# Patient Record
Sex: Male | Born: 1950 | Race: White | Hispanic: No | Smoking: Never smoker
Health system: Southern US, Community
[De-identification: ages and names within clinical notes are randomized; demographics above are authoritative.]

## PROBLEM LIST (undated history)

## (undated) DIAGNOSIS — E785 Hyperlipidemia, unspecified: Secondary | ICD-10-CM

## (undated) DIAGNOSIS — N401 Enlarged prostate with lower urinary tract symptoms: Secondary | ICD-10-CM

## (undated) DIAGNOSIS — G5601 Carpal tunnel syndrome, right upper limb: Secondary | ICD-10-CM

## (undated) DIAGNOSIS — Z8719 Personal history of other diseases of the digestive system: Secondary | ICD-10-CM

## (undated) DIAGNOSIS — I10 Essential (primary) hypertension: Secondary | ICD-10-CM

## (undated) DIAGNOSIS — E7800 Pure hypercholesterolemia, unspecified: Secondary | ICD-10-CM

## (undated) DIAGNOSIS — D509 Iron deficiency anemia, unspecified: Secondary | ICD-10-CM

## (undated) DIAGNOSIS — M1990 Unspecified osteoarthritis, unspecified site: Secondary | ICD-10-CM

## (undated) DIAGNOSIS — R030 Elevated blood-pressure reading, without diagnosis of hypertension: Secondary | ICD-10-CM

## (undated) DIAGNOSIS — Z8546 Personal history of malignant neoplasm of prostate: Secondary | ICD-10-CM

---

## 1998-04-08 ENCOUNTER — Ambulatory Visit (HOSPITAL_COMMUNITY): Admission: RE | Admit: 1998-04-08 | Discharge: 1998-04-08 | Payer: Self-pay | Admitting: Family Medicine

## 1998-05-06 ENCOUNTER — Ambulatory Visit (HOSPITAL_BASED_OUTPATIENT_CLINIC_OR_DEPARTMENT_OTHER): Admission: RE | Admit: 1998-05-06 | Discharge: 1998-05-06 | Payer: Self-pay | Admitting: Orthopedic Surgery

## 2000-06-22 HISTORY — PX: KNEE ARTHROSCOPY: SUR90

## 2000-09-30 ENCOUNTER — Ambulatory Visit (HOSPITAL_COMMUNITY): Admission: RE | Admit: 2000-09-30 | Discharge: 2000-09-30 | Payer: Self-pay | Admitting: Family Medicine

## 2000-09-30 ENCOUNTER — Encounter: Payer: Self-pay | Admitting: Family Medicine

## 2000-11-02 ENCOUNTER — Ambulatory Visit (HOSPITAL_BASED_OUTPATIENT_CLINIC_OR_DEPARTMENT_OTHER): Admission: RE | Admit: 2000-11-02 | Discharge: 2000-11-02 | Payer: Self-pay | Admitting: Orthopaedic Surgery

## 2015-06-23 DIAGNOSIS — I6523 Occlusion and stenosis of bilateral carotid arteries: Secondary | ICD-10-CM

## 2015-06-23 HISTORY — DX: Occlusion and stenosis of bilateral carotid arteries: I65.23

## 2015-10-10 ENCOUNTER — Other Ambulatory Visit: Payer: Self-pay | Admitting: Family Medicine

## 2015-10-10 DIAGNOSIS — I6523 Occlusion and stenosis of bilateral carotid arteries: Secondary | ICD-10-CM

## 2015-10-15 ENCOUNTER — Ambulatory Visit
Admission: RE | Admit: 2015-10-15 | Discharge: 2015-10-15 | Disposition: A | Discharge status: Home / Self Care | Payer: 59 | Source: Ambulatory Visit | Attending: Family Medicine | Admitting: Family Medicine

## 2015-10-15 DIAGNOSIS — I6523 Occlusion and stenosis of bilateral carotid arteries: Secondary | ICD-10-CM

## 2016-09-04 ENCOUNTER — Ambulatory Visit (INDEPENDENT_AMBULATORY_CARE_PROVIDER_SITE_OTHER): Payer: 59 | Admitting: Family Medicine

## 2016-09-04 VITALS — BP 132/70 | HR 77 | Temp 97.8°F | Resp 16 | Ht 68.0 in | Wt 182.8 lb

## 2016-09-04 DIAGNOSIS — J329 Chronic sinusitis, unspecified: Secondary | ICD-10-CM | POA: Diagnosis not present

## 2016-09-04 MED ORDER — BENZONATATE 100 MG PO CAPS: 100.0000 mg | ORAL_CAPSULE | Freq: Three times a day (TID) | ORAL | 0 refills | Status: DC | PRN

## 2016-09-04 MED ORDER — AMOXICILLIN-POT CLAVULANATE 875-125 MG PO TABS: 1.0000 | ORAL_TABLET | Freq: Two times a day (BID) | ORAL | 0 refills | Status: DC

## 2016-09-04 NOTE — Progress Notes (Signed)
   Chief Complaint  Patient presents with  . URI    sinus/congestion white plegm/mucus x 2weeks  SUBJECTIVE:   Camp Gopal is a 66 y.o. male who complains of nasal congestion x 2 weeks. He reports illness started with head congestion. He took some Advil cold and sinus which temporarily improved symptoms for a few days.  Nasal congestion with some nasal drainage which is clear,coughing is worst with lying down, and sore throat has developed. He has recently began to lose his voice. Denies fever. Has no hx of tobacco use or asthma.   Social History   Social History  . Marital status: Married    Spouse name: N/A  . Number of children: N/A  . Years of education: N/A   Occupational History  . Not on file.   Social History Main Topics  . Smoking status: Never Smoker  . Smokeless tobacco: Never Used  . Alcohol use No  . Drug use: No  . Sexual activity: Not on file   Other Topics Concern  . Not on file   Social History Narrative  . No narrative on file   OBJECTIVE: He appears well, vital signs are as noted. Ears normal.  Throat and pharynx normal.  Neck supple. No adenopathy in the neck. Nose is congested with mucosal edema. Sinuses non tender. The chest is clear, without wheezes or rales. Heart rate and  rhythm normal.  ASSESSMENT:  Sinusitis   PLAN: -Start Augmentin 1 tablet twice daily with food to avoid stomach upset x 10 days Complete all medication. -Benzonatate 100 mg-200 mg up to 3 times daily as needed for cough. -Call or return to clinic prn if these symptoms worsen or fail to improve as anticipated.   Carroll Sage. Kenton Kingfisher, MSN, FNP-C [facility]

## 2016-09-04 NOTE — Patient Instructions (Addendum)
Start Augmentin 1 tablet twice daily with food to avoid stomach upset x 10 days. Complete all medication.  For cough take Benzonatate 100-200 mg up to three times daily for cough.  May take a benadryl at night with cough tablet to induce sleep.   IF you received an x-ray today, you will receive an invoice from [facility]. Please contact [facility] at [phone] with questions or concerns regarding your invoice.   IF you received labwork today, you will receive an invoice from [facility]. Please contact [facility] at [phone] with questions or concerns regarding your invoice.   Our billing staff will not be able to assist you with questions regarding bills from these companies.  You will be contacted with the lab results as soon as they are available. The fastest way to get your results is to activate your My Chart account. Instructions are located on the last page of this paperwork. If you have not heard from [place] regarding the results in 2 weeks, please contact this office.     Sinusitis, Adult Sinusitis is soreness and inflammation of your sinuses. Sinuses are hollow spaces in the bones around your face. Your sinuses are located:  Around your eyes.  In the middle of your forehead.  Behind your nose.  In your cheekbones. Your sinuses and nasal passages are lined with a stringy fluid (mucus). Mucus normally drains out of your sinuses. When your nasal tissues become inflamed or swollen, the mucus can become trapped or blocked so air cannot flow through your sinuses. This allows bacteria, viruses, and funguses to grow, which leads to infection. Sinusitis can develop quickly and last for 7?10 days (acute) or for more than 12 weeks (chronic). Sinusitis often develops after a cold. What are the causes? This condition is caused by anything that creates swelling in the sinuses or stops mucus from draining, including:  Allergies.  Asthma.  Bacterial or viral  infection.  Abnormally shaped bones between the nasal passages.  Nasal growths that contain mucus (nasal polyps).  Narrow sinus openings.  Pollutants, such as chemicals or irritants in the air.  A foreign object stuck in the nose.  A fungal infection. This is rare. What increases the risk? The following factors may make you more likely to develop this condition:  Having allergies or asthma.  Having had a recent cold or respiratory tract infection.  Having structural deformities or blockages in your nose or sinuses.  Having a weak immune system.  Doing a lot of swimming or diving.  Overusing nasal sprays.  Smoking. What are the signs or symptoms? The main symptoms of this condition are pain and a feeling of pressure around the affected sinuses. Other symptoms include:  Upper toothache.  Earache.  Headache.  Bad breath.  Decreased sense of smell and taste.  A cough that may get worse at night.  Fatigue.  Fever.  Thick drainage from your nose. The drainage is often green and it may contain pus (purulent).  Stuffy nose or congestion.  Postnasal drip. This is when extra mucus collects in the throat or back of the nose.  Swelling and warmth over the affected sinuses.  Sore throat.  Sensitivity to light. How is this diagnosed? This condition is diagnosed based on symptoms, a medical history, and a physical exam. To find out if your condition is acute or chronic, your health care provider may:  Look in your nose for signs of nasal polyps.  Tap over the affected sinus to check for signs  of infection.  View the inside of your sinuses using an imaging device that has a light attached (endoscope). If your health care provider suspects that you have chronic sinusitis, you may also:  Be tested for allergies.  Have a sample of mucus taken from your nose (nasal culture) and checked for bacteria.  Have a mucus sample examined to see if your sinusitis is related  to an allergy. If your sinusitis does not respond to treatment and it lasts longer than 8 weeks, you may have an MRI or CT scan to check your sinuses. These scans also help to determine how severe your infection is. In rare cases, a bone biopsy may be done to rule out more serious types of fungal sinus disease. How is this treated? Treatment for sinusitis depends on the cause and whether your condition is chronic or acute. If a virus is causing your sinusitis, your symptoms will go away on their own within 10 days. You may be given medicines to relieve your symptoms, including:  Topical nasal decongestants. They shrink swollen nasal passages and let mucus drain from your sinuses.  Antihistamines. These drugs block inflammation that is triggered by allergies. This can help to ease swelling in your nose and sinuses.  Topical nasal corticosteroids. These are nasal sprays that ease inflammation and swelling in your nose and sinuses.  Nasal saline washes. These rinses can help to get rid of thick mucus in your nose. If your condition is caused by bacteria, you will be given an antibiotic medicine. If your condition is caused by a fungus, you will be given an antifungal medicine. Surgery may be needed to correct underlying conditions, such as narrow nasal passages. Surgery may also be needed to remove polyps. Follow these instructions at home: Medicines   Take, use, or apply over-the-counter and prescription medicines only as told by your health care provider. These may include nasal sprays.  If you were prescribed an antibiotic medicine, take it as told by your health care provider. Do not stop taking the antibiotic even if you start to feel better. Hydrate and Humidify   Drink enough water to keep your urine clear or pale yellow. Staying hydrated will help to thin your mucus.  Use a cool mist humidifier to keep the humidity level in your home above 50%.  Inhale steam for 10-15 minutes, 3-4  times a day or as told by your health care provider. You can do this in the bathroom while a hot shower is running.  Limit your exposure to cool or dry air. Rest   Rest as much as possible.  Sleep with your head raised (elevated).  Make sure to get enough sleep each night. General instructions   Apply a warm, moist washcloth to your face 3-4 times a day or as told by your health care provider. This will help with discomfort.  Wash your hands often with soap and water to reduce your exposure to viruses and other germs. If soap and water are not available, use hand sanitizer.  Do not smoke. Avoid being around people who are smoking (secondhand smoke).  Keep all follow-up visits as told by your health care provider. This is important. Contact a health care provider if:  You have a fever.  Your symptoms get worse.  Your symptoms do not improve within 10 days. Get help right away if:  You have a severe headache.  You have persistent vomiting.  You have pain or swelling around your face or eyes.  You have vision problems.  You develop confusion.  Your neck is stiff.  You have trouble breathing. This information is not intended to replace advice given to you by your health care provider. Make sure you discuss any questions you have with your health care provider. Document Released: 06/08/2005 Document Revised: 02/02/2016 Document Reviewed: 04/03/2015 Elsevier Interactive Patient Education  2017 [employer].

## 2017-06-22 HISTORY — PX: COLONOSCOPY: SHX174

## 2017-10-07 IMAGING — US US CAROTID DUPLEX BILAT
1 series · 13 of 24 positions shown · non-contrast
Comparison: None.

CLINICAL DATA: 64-year-old male with asymptomatic bilateral carotid
artery stenosis

EXAM:
BILATERAL CAROTID DUPLEX ULTRASOUND
TECHNIQUE: Gray scale imaging, color Doppler and duplex ultrasound were
performed of bilateral carotid and vertebral arteries in the neck.

[Series 1: us carotid duplex bilat · 0.07mm/px · 13 of 66 slices shown]
[im 1/66]
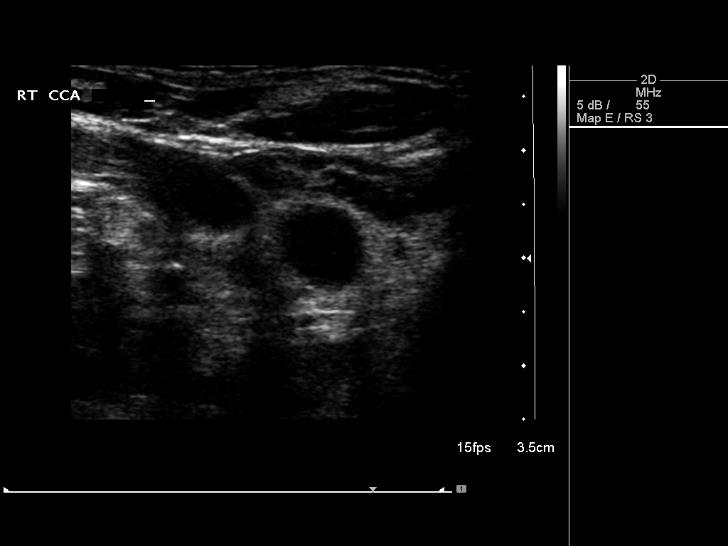
[im 6/66]
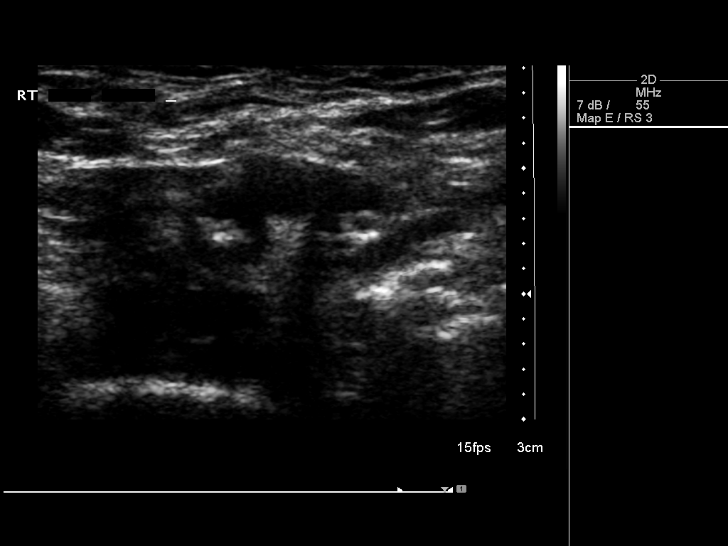
[im 12/66]
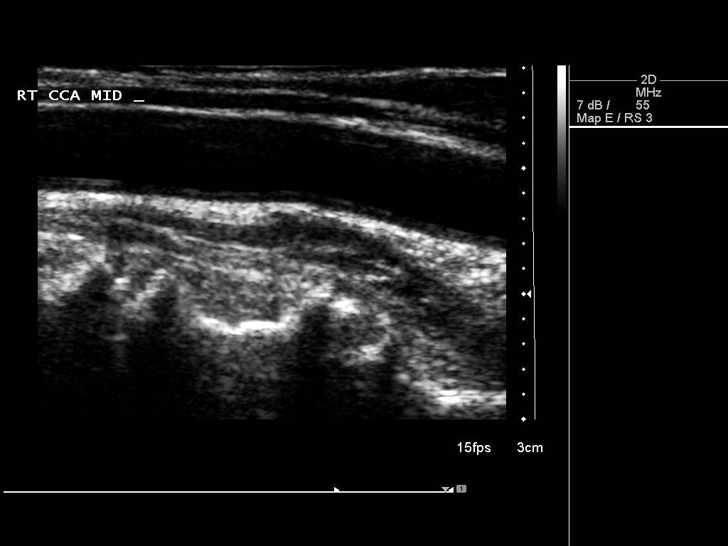
[im 17/66]
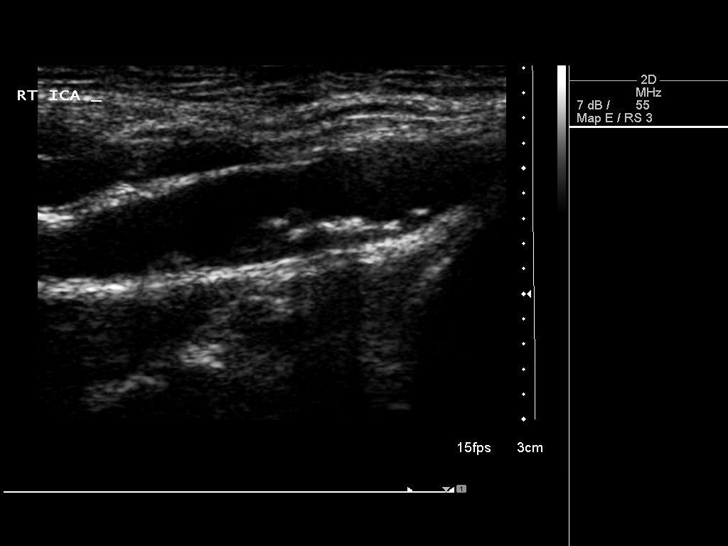
[im 23/66]
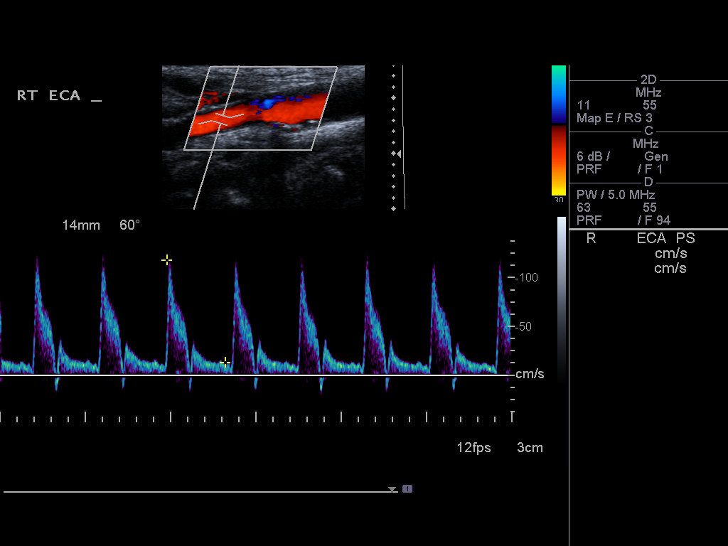
[im 29/66]
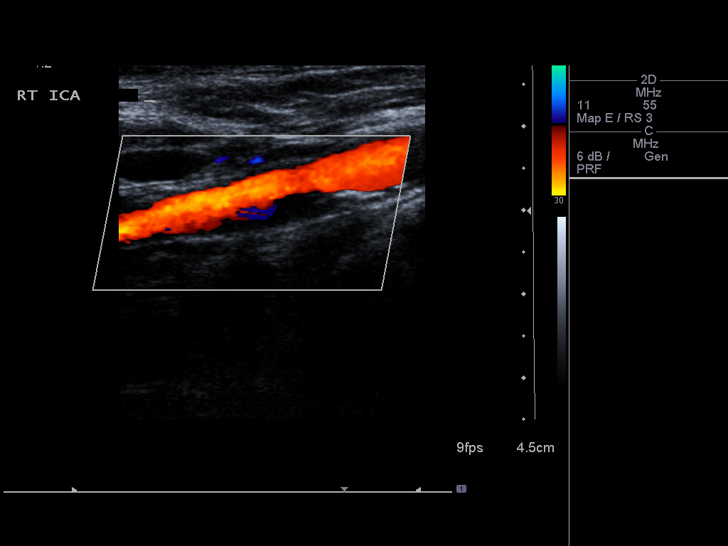
[im 34/66]
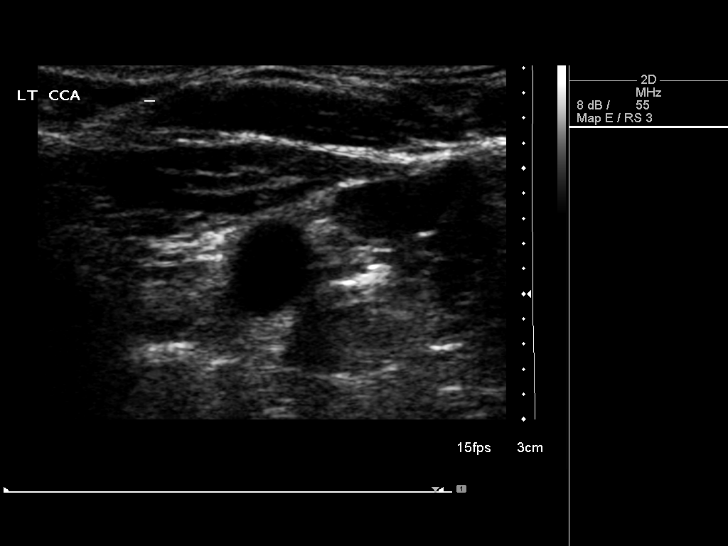
[im 37/66]
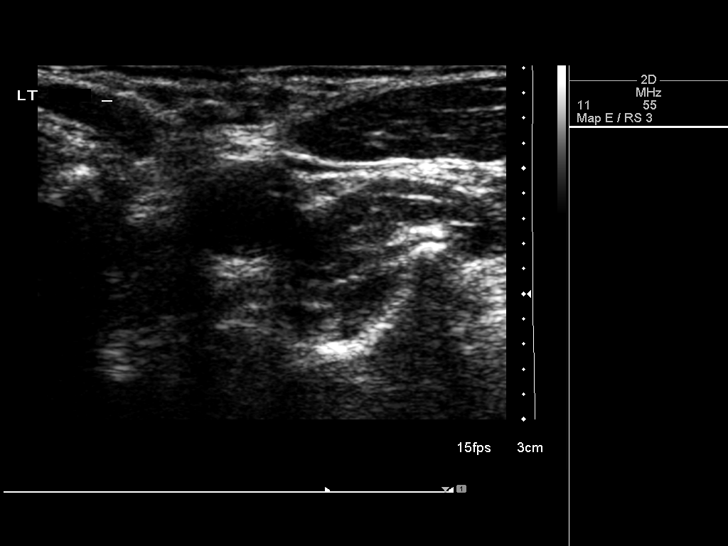
[im 43/66]
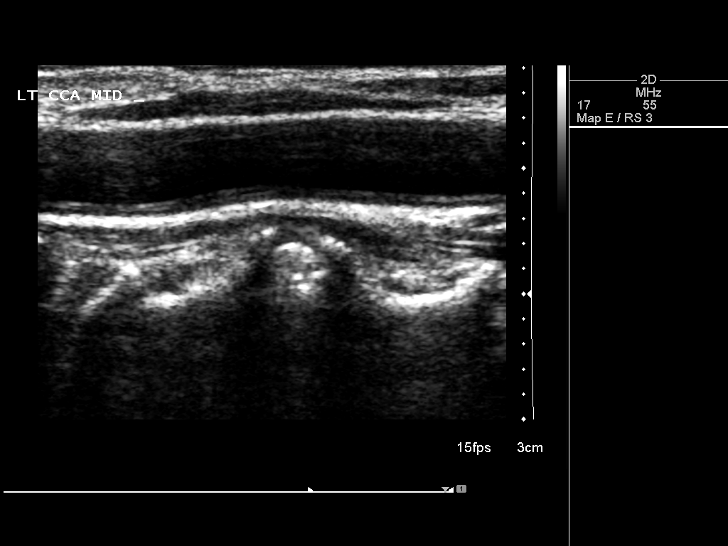
[im 49/66]
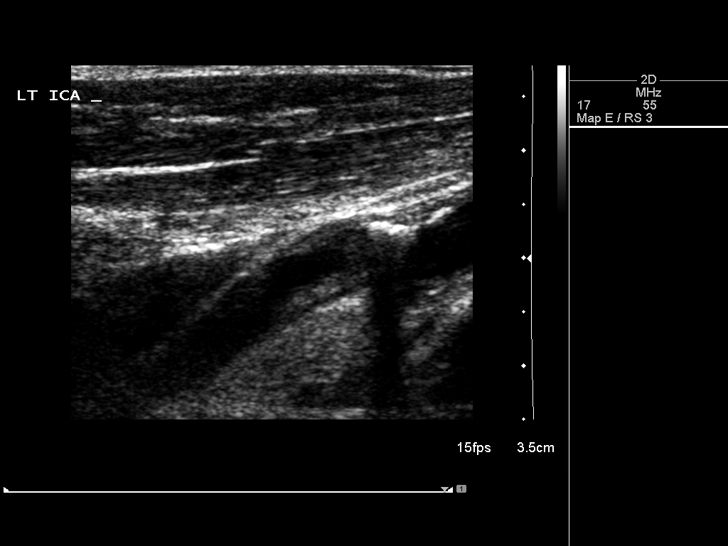
[im 54/66]
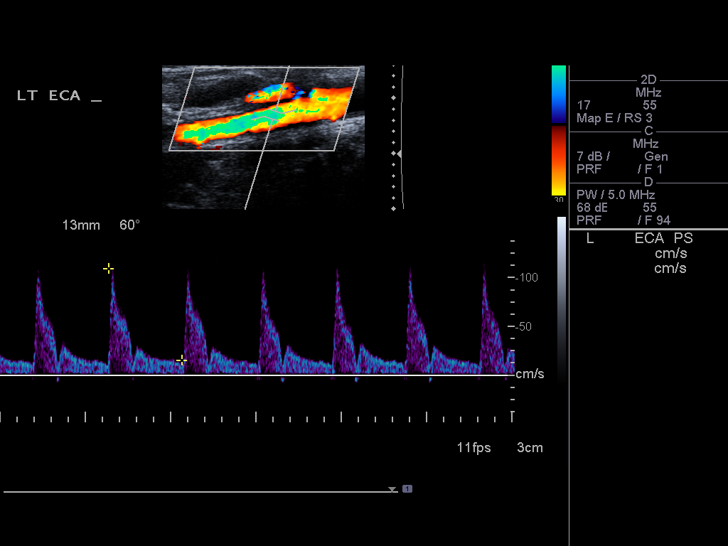
[im 60/66]
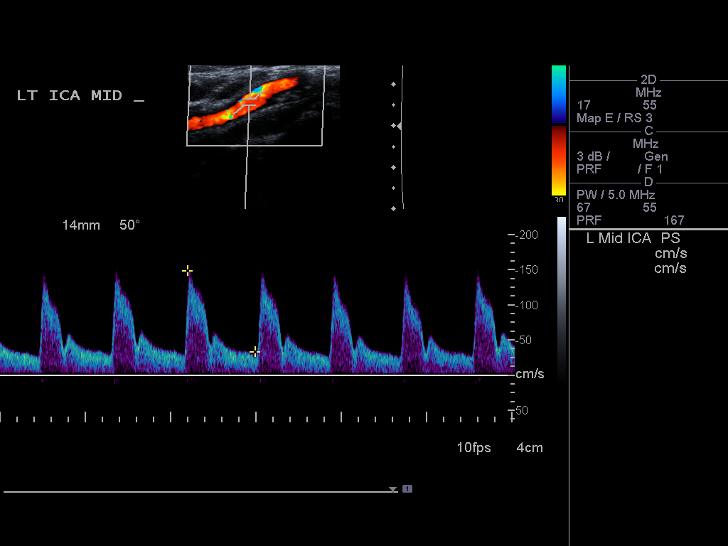
[im 66/66]
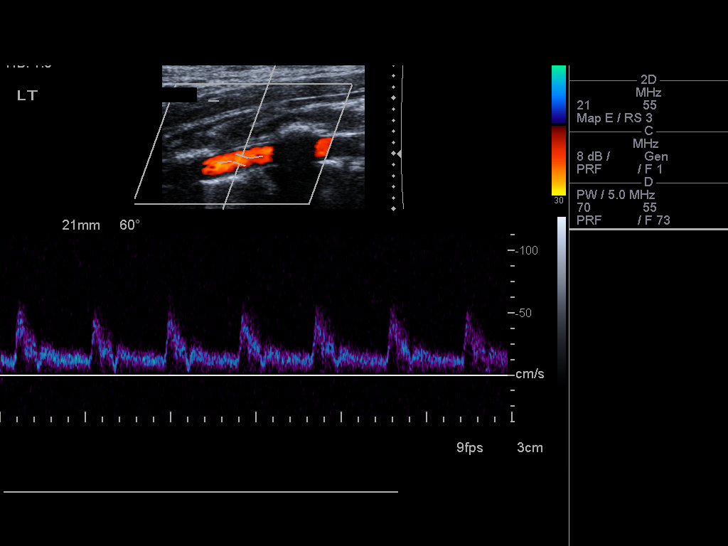

[13 of 24 positions shown; findings below may reference images not displayed]

FINDINGS: Criteria: Quantification of carotid stenosis is based on velocity
parameters that correlate the residual internal carotid diameter
with NASCET-based stenosis levels, using the diameter of the distal
internal carotid lumen as the denominator for stenosis measurement.

The following velocity measurements were obtained:

RIGHT

ICA:  150/18 cm/sec

CCA:  141/18 cm/sec

SYSTOLIC ICA/CCA RATIO:

DIASTOLIC ICA/CCA RATIO:

ECA:  118 cm/sec

LEFT

ICA:  153/50 cm/sec

CCA:  138/17 cm/sec

SYSTOLIC ICA/CCA RATIO:

DIASTOLIC ICA/CCA RATIO:

ECA:  109 cm/sec

RIGHT CAROTID ARTERY: Heterogeneous atherosclerotic plaque in the
carotid bifurcation extending into the proximal internal carotid
artery. By peak systolic velocity criteria the estimated stenosis
falls into the 50- 69% diameter range.

RIGHT VERTEBRAL ARTERY:  Patent with normal antegrade flow.

LEFT CAROTID ARTERY: Mild focal hypoechoic atherosclerotic plaque in
the proximal internal carotid artery. By peak systolic velocity
criteria the estimated stenosis falls in the 50- 69% diameter range.

LEFT VERTEBRAL ARTERY:  Patent with normal antegrade flow.
IMPRESSION: 1. Moderate (50-69%) stenosis proximal right internal carotid artery
secondary to heterogenous atherosclerotic plaque.
2. Moderate (50-69%) stenosis proximal left internal carotid artery
secondary to hypoechoic atherosclerotic plaque.
3. Vertebral arteries are patent with normal antegrade flow.

## 2018-05-05 DIAGNOSIS — Z0000 Encounter for general adult medical examination without abnormal findings: Secondary | ICD-10-CM | POA: Diagnosis not present

## 2018-05-05 DIAGNOSIS — Z1211 Encounter for screening for malignant neoplasm of colon: Secondary | ICD-10-CM | POA: Diagnosis not present

## 2018-05-05 DIAGNOSIS — E7800 Pure hypercholesterolemia, unspecified: Secondary | ICD-10-CM | POA: Diagnosis not present

## 2018-05-05 DIAGNOSIS — Z1389 Encounter for screening for other disorder: Secondary | ICD-10-CM | POA: Diagnosis not present

## 2018-05-05 DIAGNOSIS — R030 Elevated blood-pressure reading, without diagnosis of hypertension: Secondary | ICD-10-CM | POA: Diagnosis not present

## 2018-05-05 DIAGNOSIS — Z125 Encounter for screening for malignant neoplasm of prostate: Secondary | ICD-10-CM | POA: Diagnosis not present

## 2018-05-05 DIAGNOSIS — Z23 Encounter for immunization: Secondary | ICD-10-CM | POA: Diagnosis not present

## 2018-05-05 DIAGNOSIS — I6523 Occlusion and stenosis of bilateral carotid arteries: Secondary | ICD-10-CM | POA: Diagnosis not present

## 2018-05-05 DIAGNOSIS — Z6828 Body mass index (BMI) 28.0-28.9, adult: Secondary | ICD-10-CM | POA: Diagnosis not present

## 2018-05-09 ENCOUNTER — Other Ambulatory Visit: Payer: Self-pay | Admitting: Family Medicine

## 2018-05-09 DIAGNOSIS — I6523 Occlusion and stenosis of bilateral carotid arteries: Secondary | ICD-10-CM

## 2018-05-12 ENCOUNTER — Ambulatory Visit
Admission: RE | Admit: 2018-05-12 | Discharge: 2018-05-12 | Disposition: A | Discharge status: Home / Self Care | Payer: Medicare Other | Source: Ambulatory Visit | Attending: Family Medicine | Admitting: Family Medicine

## 2018-05-12 DIAGNOSIS — I6523 Occlusion and stenosis of bilateral carotid arteries: Secondary | ICD-10-CM | POA: Diagnosis not present

## 2018-05-25 DIAGNOSIS — Z1211 Encounter for screening for malignant neoplasm of colon: Secondary | ICD-10-CM | POA: Diagnosis not present

## 2018-05-25 DIAGNOSIS — K625 Hemorrhage of anus and rectum: Secondary | ICD-10-CM | POA: Diagnosis not present

## 2018-07-06 DIAGNOSIS — K640 First degree hemorrhoids: Secondary | ICD-10-CM | POA: Diagnosis not present

## 2018-07-06 DIAGNOSIS — K6389 Other specified diseases of intestine: Secondary | ICD-10-CM | POA: Diagnosis not present

## 2018-07-06 DIAGNOSIS — Z1211 Encounter for screening for malignant neoplasm of colon: Secondary | ICD-10-CM | POA: Diagnosis not present

## 2018-08-31 DIAGNOSIS — R972 Elevated prostate specific antigen [PSA]: Secondary | ICD-10-CM | POA: Diagnosis not present

## 2018-08-31 DIAGNOSIS — E860 Dehydration: Secondary | ICD-10-CM | POA: Diagnosis not present

## 2018-12-15 DIAGNOSIS — E860 Dehydration: Secondary | ICD-10-CM | POA: Diagnosis not present

## 2019-08-18 DIAGNOSIS — Z23 Encounter for immunization: Secondary | ICD-10-CM | POA: Diagnosis not present

## 2019-09-14 DIAGNOSIS — Z23 Encounter for immunization: Secondary | ICD-10-CM | POA: Diagnosis not present

## 2019-11-22 DIAGNOSIS — I6523 Occlusion and stenosis of bilateral carotid arteries: Secondary | ICD-10-CM | POA: Diagnosis not present

## 2019-11-22 DIAGNOSIS — R030 Elevated blood-pressure reading, without diagnosis of hypertension: Secondary | ICD-10-CM | POA: Diagnosis not present

## 2019-11-22 DIAGNOSIS — E7800 Pure hypercholesterolemia, unspecified: Secondary | ICD-10-CM | POA: Diagnosis not present

## 2019-11-22 DIAGNOSIS — Z0000 Encounter for general adult medical examination without abnormal findings: Secondary | ICD-10-CM | POA: Diagnosis not present

## 2019-11-22 DIAGNOSIS — Z125 Encounter for screening for malignant neoplasm of prostate: Secondary | ICD-10-CM | POA: Diagnosis not present

## 2020-01-15 DIAGNOSIS — E860 Dehydration: Secondary | ICD-10-CM | POA: Diagnosis not present

## 2020-01-29 DIAGNOSIS — E7800 Pure hypercholesterolemia, unspecified: Secondary | ICD-10-CM | POA: Diagnosis not present

## 2020-03-22 DIAGNOSIS — H10021 Other mucopurulent conjunctivitis, right eye: Secondary | ICD-10-CM | POA: Diagnosis not present

## 2020-05-27 DIAGNOSIS — Z23 Encounter for immunization: Secondary | ICD-10-CM | POA: Diagnosis not present

## 2021-10-27 DIAGNOSIS — M79641 Pain in right hand: Secondary | ICD-10-CM | POA: Insufficient documentation

## 2021-10-27 DIAGNOSIS — G5600 Carpal tunnel syndrome, unspecified upper limb: Secondary | ICD-10-CM | POA: Insufficient documentation

## 2021-10-27 DIAGNOSIS — M79642 Pain in left hand: Secondary | ICD-10-CM | POA: Insufficient documentation

## 2021-12-20 HISTORY — PX: CARPAL TUNNEL RELEASE: SHX101

## 2022-01-01 ENCOUNTER — Other Ambulatory Visit: Payer: Self-pay | Admitting: Physician Assistant

## 2022-01-01 DIAGNOSIS — I6523 Occlusion and stenosis of bilateral carotid arteries: Secondary | ICD-10-CM

## 2022-01-12 DIAGNOSIS — Z4789 Encounter for other orthopedic aftercare: Secondary | ICD-10-CM | POA: Insufficient documentation

## 2022-01-19 ENCOUNTER — Ambulatory Visit
Admission: RE | Admit: 2022-01-19 | Discharge: 2022-01-19 | Disposition: A | Discharge status: Home / Self Care | Payer: Medicare Other | Source: Ambulatory Visit | Attending: Physician Assistant | Admitting: Physician Assistant

## 2022-01-19 DIAGNOSIS — I6523 Occlusion and stenosis of bilateral carotid arteries: Secondary | ICD-10-CM

## 2022-02-02 ENCOUNTER — Other Ambulatory Visit: Payer: Self-pay | Admitting: *Deleted

## 2022-02-02 DIAGNOSIS — I6529 Occlusion and stenosis of unspecified carotid artery: Secondary | ICD-10-CM

## 2022-02-03 ENCOUNTER — Ambulatory Visit: Payer: Medicare Other | Admitting: Vascular Surgery

## 2022-02-03 ENCOUNTER — Ambulatory Visit (HOSPITAL_COMMUNITY)
Admission: RE | Admit: 2022-02-03 | Discharge: 2022-02-03 | Disposition: A | Discharge status: Home / Self Care | Payer: Medicare Other | Source: Ambulatory Visit | Attending: Vascular Surgery | Admitting: Vascular Surgery

## 2022-02-03 ENCOUNTER — Encounter: Payer: Self-pay | Admitting: Vascular Surgery

## 2022-02-03 DIAGNOSIS — I6521 Occlusion and stenosis of right carotid artery: Secondary | ICD-10-CM

## 2022-02-03 DIAGNOSIS — I6529 Occlusion and stenosis of unspecified carotid artery: Secondary | ICD-10-CM | POA: Diagnosis present

## 2022-02-03 DIAGNOSIS — I6522 Occlusion and stenosis of left carotid artery: Secondary | ICD-10-CM

## 2022-02-03 NOTE — Progress Notes (Signed)
Patient name: Peter Roberts MRN: [id number] DOB: 21-Jun-1951 Sex: male  REASON FOR CONSULT: Carotid stenosis  HPI: Tyr Franca is a 71 y.o. male, with history of hyperlipidemia that presents for evaluation of carotid artery disease.  He states that his carotid arteries have been followed by his PCP, Dr. Alyson Ingles.  Ultimately he was recently noted to have progression of his carotid disease on duplex on 01/19/22 with a 70-99% left ICA stenosis.  He has no history of stroke or TIA.  He has had no previous neck surgery.  PMH: HLD  PSH: Non-contributory  No family history on file.  SOCIAL HISTORY: Social History   Socioeconomic History   Marital status: Married    Spouse name: Not on file   Number of children: Not on file   Years of education: Not on file   Highest education level: Not on file  Occupational History   Not on file  Tobacco Use   Smoking status: Never   Smokeless tobacco: Never  Substance and Sexual Activity   Alcohol use: No   Drug use: No   Sexual activity: Not on file  Other Topics Concern   Not on file  Social History Narrative   Not on file   Social Determinants of Health   Financial Resource Strain: Not on file  Food Insecurity: Not on file  Transportation Needs: Not on file  Physical Activity: Not on file  Stress: Not on file  Social Connections: Not on file  Intimate Partner Violence: Not on file    Not on File  Current Outpatient Medications  Medication Sig Dispense Refill   rosuvastatin (CRESTOR) 10 MG tablet Take 10 mg by mouth daily.     amoxicillin-clavulanate (AUGMENTIN) 875-125 MG tablet Take 1 tablet by mouth 2 (two) times daily. (Patient not taking: Reported on 02/03/2022) 20 tablet 0   benzonatate (TESSALON) 100 MG capsule Take 1-2 capsules (100-200 mg total) by mouth 3 (three) times daily as needed for cough. (Patient not taking: Reported on 02/03/2022) 40 capsule 0   No current facility-administered medications for this visit.     REVIEW OF SYSTEMS:  '[X]'$  denotes positive finding, '[ ]'$  denotes negative finding Cardiac  Comments:  Chest pain or chest pressure:    Shortness of breath upon exertion:    Short of breath when lying flat:    Irregular heart rhythm:        Vascular    Pain in calf, thigh, or hip brought on by ambulation:    Pain in feet at night that wakes you up from your sleep:     Blood clot in your veins:    Leg swelling:         Pulmonary    Oxygen at home:    Productive cough:     Wheezing:         Neurologic    Sudden weakness in arms or legs:     Sudden numbness in arms or legs:     Sudden onset of difficulty speaking or slurred speech:    Temporary loss of vision in one eye:     Problems with dizziness:         Gastrointestinal    Blood in stool:     Vomited blood:         Genitourinary    Burning when urinating:     Blood in urine:        Psychiatric    Major depression:  Hematologic    Bleeding problems:    Problems with blood clotting too easily:        Skin    Rashes or ulcers:        Constitutional    Fever or chills:      PHYSICAL EXAM: Vitals:   02/03/22 1343 02/03/22 1346  BP: 131/79 (!) 148/83  Pulse: 69 68  Resp: 18   Temp: 97.6 F (36.4 C)   TempSrc: Temporal   SpO2: 98%   Weight: 174 lb (78.9 kg)   Height: '5\' 7"'$  (1.702 m)     GENERAL: The patient is a well-nourished male, in no acute distress. The vital signs are documented above. CARDIAC: There is a regular rate and rhythm.  VASCULAR:  No previous neck incisions PULMONARY: No respiratory distress. ABDOMEN: Soft and non-tender. MUSCULOSKELETAL: There are no major deformities or cyanosis. NEUROLOGIC: No focal weakness or paresthesias are detected.  Cranial nerves II through XII grossly intact SKIN: There are no ulcers or rashes noted. PSYCHIATRIC: The patient has a normal affect.  DATA:   Carotid duplex today there is a 40 to 59% right ICA stenosis high-grade left ICA stenosis  80-99%  Assessment/Plan:  71 year old male presents with asymptomatic high-grade left ICA stenosis greater than 80%.  Discussed that he would benefit from carotid revascularization for stroke risk reduction.  Discussed that current guidelines are surgical intervention for asymptomatic disease for any stenosis over 80%.  I have recommended a left carotid endarterectomy.  I did look at his carotid bifurcation in the office with ultrasound and he appears to have a fairly normal bifurcation.  Disease looks pretty focal in the proximal ICA.  I discussed risk and benefits of surgery including 1% risk of perioperative stroke.  He wants to get scheduled in early September.  All questions answered.     Marty Heck, MD [facility] Office: [phone]

## 2022-02-04 ENCOUNTER — Other Ambulatory Visit: Payer: Self-pay

## 2022-02-04 DIAGNOSIS — I6522 Occlusion and stenosis of left carotid artery: Secondary | ICD-10-CM

## 2022-02-18 NOTE — Pre-Procedure Instructions (Signed)
Surgical Instructions    Your procedure is scheduled on Wednesday, September 6th.  Report to [facility] Main Entrance "A" at 8:30 A.M., then check in with the Admitting office.  Call this number if you have problems the morning of surgery:  [phone]   If you have any questions prior to your surgery date call [phone]: Open Monday-Friday 8am-4pm    Remember:  Do not eat or drink after midnight the night before your surgery    Take these medicines the morning of surgery with A SIP OF WATER  Aspirin  As of today, STOP taking any Aleve, Naproxen, Ibuprofen, Motrin, Advil, Goody's, BC's, all herbal medications, fish oil, and all vitamins.                     Do NOT Smoke (Tobacco/Vaping) for 24 hours prior to your procedure.  If you use a CPAP at night, you may bring your mask/headgear for your overnight stay.   Contacts, glasses, piercing's, hearing aid's, dentures or partials may not be worn into surgery, please bring cases for these belongings.    For patients admitted to the hospital, discharge time will be determined by your treatment team.   Patients discharged the day of surgery will not be allowed to drive home, and someone needs to stay with them for 24 hours.  SURGICAL WAITING ROOM VISITATION Patients having surgery or a procedure may have no more than 2 support people in the waiting area - these visitors may rotate.   Children under the age of 51 must have an adult with them who is not the patient. If the patient needs to stay at the hospital during part of their recovery, the visitor guidelines for inpatient rooms apply. Pre-op nurse will coordinate an appropriate time for 1 support person to accompany patient in pre-op.  This support person may not rotate.   Please refer to the [facility] website for the visitor guidelines for Inpatients (after your surgery is over and you are in a regular room).    Special instructions:   [facility]- Preparing For  Surgery  Before surgery, you can play an important role. Because skin is not sterile, your skin needs to be as free of germs as possible. You can reduce the number of germs on your skin by washing with CHG (chlorahexidine gluconate) Soap before surgery.  CHG is an antiseptic cleaner which kills germs and bonds with the skin to continue killing germs even after washing.    Oral Hygiene is also important to reduce your risk of infection.  Remember - BRUSH YOUR TEETH THE MORNING OF SURGERY WITH YOUR REGULAR TOOTHPASTE  Please do not use if you have an allergy to CHG or antibacterial soaps. If your skin becomes reddened/irritated stop using the CHG.  Do not shave (including legs and underarms) for at least 48 hours prior to first CHG shower. It is OK to shave your face.  Please follow these instructions carefully.   Shower the NIGHT BEFORE SURGERY and the MORNING OF SURGERY  If you chose to wash your hair, wash your hair first as usual with your normal shampoo.  After you shampoo, rinse your hair and body thoroughly to remove the shampoo.  Use CHG Soap as you would any other liquid soap. You can apply CHG directly to the skin and wash gently with a scrungie or a clean washcloth.   Apply the CHG Soap to your body ONLY FROM THE NECK DOWN.  Do not use  on open wounds or open sores. Avoid contact with your eyes, ears, mouth and genitals (private parts). Wash Face and genitals (private parts)  with your normal soap.   Wash thoroughly, paying special attention to the area where your surgery will be performed.  Thoroughly rinse your body with warm water from the neck down.  DO NOT shower/wash with your normal soap after using and rinsing off the CHG Soap.  Pat yourself dry with a CLEAN TOWEL.  Wear CLEAN PAJAMAS to bed the night before surgery  Place CLEAN SHEETS on your bed the night before your surgery  DO NOT SLEEP WITH PETS.   Day of Surgery: Take a shower with CHG soap. Do not wear  jewelry Do not wear lotions, powders, colognes, or deodorant. Men may shave face and neck. Do not bring valuables to the hospital.  [facility] is not responsible for any belongings or valuables. Wear Clean/Comfortable clothing the morning of surgery Remember to brush your teeth WITH YOUR REGULAR TOOTHPASTE.   Please read over the following fact sheets that you were given.    If you received a COVID test during your pre-op visit  it is requested that you wear a mask when out in public, stay away from anyone that may not be feeling well and notify your surgeon if you develop symptoms. If you have been in contact with anyone that has tested positive in the last 10 days please notify you surgeon.

## 2022-02-19 ENCOUNTER — Encounter (HOSPITAL_COMMUNITY)
Admission: RE | Admit: 2022-02-19 | Discharge: 2022-02-19 | Disposition: A | Discharge status: Home / Self Care | Payer: Medicare Other | Source: Ambulatory Visit | Attending: Vascular Surgery | Admitting: Vascular Surgery

## 2022-02-19 ENCOUNTER — Encounter (HOSPITAL_COMMUNITY): Payer: Self-pay

## 2022-02-19 ENCOUNTER — Other Ambulatory Visit: Payer: Self-pay

## 2022-02-19 VITALS — BP 124/68 | HR 72 | Temp 97.6°F | Resp 17 | Ht 67.0 in | Wt 175.1 lb

## 2022-02-19 DIAGNOSIS — I2510 Atherosclerotic heart disease of native coronary artery without angina pectoris: Secondary | ICD-10-CM | POA: Diagnosis not present

## 2022-02-19 DIAGNOSIS — I6522 Occlusion and stenosis of left carotid artery: Secondary | ICD-10-CM | POA: Diagnosis not present

## 2022-02-19 DIAGNOSIS — Z01818 Encounter for other preprocedural examination: Secondary | ICD-10-CM | POA: Insufficient documentation

## 2022-02-19 HISTORY — DX: Pure hypercholesterolemia, unspecified: E78.00

## 2022-02-19 LAB — COMPREHENSIVE METABOLIC PANEL
ALT: 24 U/L (ref 0–44)
AST: 23 U/L (ref 15–41)
Albumin: 4.4 g/dL (ref 3.5–5.0)
Alkaline Phosphatase: 65 U/L (ref 38–126)
Anion gap: 7 (ref 5–15)
BUN: 23 mg/dL (ref 8–23)
CO2: 28 mmol/L (ref 22–32)
Calcium: 9.8 mg/dL (ref 8.9–10.3)
Chloride: 102 mmol/L (ref 98–111)
Creatinine, Ser: 1.47 mg/dL — ABNORMAL HIGH (ref 0.61–1.24)
GFR, Estimated: 51 mL/min — ABNORMAL LOW (ref >60)
Glucose, Bld: 101 mg/dL — ABNORMAL HIGH (ref 70–99)
Potassium: 4.6 mmol/L (ref 3.5–5.1)
Sodium: 137 mmol/L (ref 135–145)
Total Bilirubin: 0.9 mg/dL (ref 0.3–1.2)
Total Protein: 7.6 g/dL (ref 6.5–8.1)

## 2022-02-19 LAB — PROTIME-INR
INR: 1 (ref 0.8–1.2)
Prothrombin Time: 13.3 seconds (ref 11.4–15.2)

## 2022-02-19 LAB — URINALYSIS, ROUTINE W REFLEX MICROSCOPIC
Bilirubin Urine: NEGATIVE
Glucose, UA: NEGATIVE mg/dL
Hgb urine dipstick: NEGATIVE
Ketones, ur: 5 mg/dL — AB
Leukocytes,Ua: NEGATIVE
Nitrite: NEGATIVE
Protein, ur: NEGATIVE mg/dL
Specific Gravity, Urine: 1.018 (ref 1.005–1.030)
pH: 5 (ref 5.0–8.0)

## 2022-02-19 LAB — TYPE AND SCREEN
ABO/RH(D): O NEG
Antibody Screen: NEGATIVE

## 2022-02-19 LAB — APTT: aPTT: 31 seconds (ref 24–36)

## 2022-02-19 LAB — CBC
HCT: 47.3 % (ref 39.0–52.0)
Hemoglobin: 15.4 g/dL (ref 13.0–17.0)
MCH: 29.3 pg (ref 26.0–34.0)
MCHC: 32.6 g/dL (ref 30.0–36.0)
MCV: 89.9 fL (ref 80.0–100.0)
Platelets: 279 10*3/uL (ref 150–400)
RBC: 5.26 MIL/uL (ref 4.22–5.81)
RDW: 13.3 % (ref 11.5–15.5)
WBC: 7.3 10*3/uL (ref 4.0–10.5)
nRBC: 0 % (ref 0.0–0.2)

## 2022-02-19 LAB — SURGICAL PCR SCREEN
MRSA, PCR: NEGATIVE
Staphylococcus aureus: POSITIVE — AB

## 2022-02-19 NOTE — Progress Notes (Signed)
PCP - Dr. Maury Dus Cardiologist - denies  PPM/ICD - n/a  Chest x-ray - n/a EKG - 02/19/2022 Stress Test - 10+ years  ECHO - denies Cardiac Cath - denies  Sleep Study - denies CPAP - denies  Blood Thinner Instructions: n/a Aspirin Instructions: Continue per VVS protocol  NPO  COVID TEST- n/a  Anesthesia review: No  Patient denies shortness of breath, fever, cough and chest pain at PAT appointment   All instructions explained to the patient, with a verbal understanding of the material. Patient agrees to go over the instructions while at home for a better understanding. Patient also instructed to self quarantine after being tested for COVID-19. The opportunity to ask questions was provided.

## 2022-02-24 NOTE — Anesthesia Preprocedure Evaluation (Signed)
Anesthesia Evaluation  Patient identified by MRN, date of birth, ID band Patient awake    Reviewed: Allergy & Precautions, NPO status , Patient's Chart, lab work & pertinent test results  Airway Mallampati: I  TM Distance: >3 FB Neck ROM: Full    Dental no notable dental hx. (+) Dental Advisory Given, Teeth Intact   Pulmonary neg pulmonary ROS,    Pulmonary exam normal breath sounds clear to auscultation       Cardiovascular negative cardio ROS Normal cardiovascular exam Rhythm:Regular Rate:Normal     Neuro/Psych negative neurological ROS     GI/Hepatic negative GI ROS, Neg liver ROS,   Endo/Other  negative endocrine ROS  Renal/GU negative Renal ROS     Musculoskeletal negative musculoskeletal ROS (+)   Abdominal   Peds  Hematology negative hematology ROS (+)   Anesthesia Other Findings   Reproductive/Obstetrics                            Anesthesia Physical Anesthesia Plan  ASA: 3  Anesthesia Plan: General   Post-op Pain Management: Tylenol PO (pre-op)* and Minimal or no pain anticipated   Induction: Intravenous  PONV Risk Score and Plan: 2 and Ondansetron, Dexamethasone and Treatment may vary due to age or medical condition  Airway Management Planned: Oral ETT  Additional Equipment: Arterial line  Intra-op Plan:   Post-operative Plan: Possible Post-op intubation/ventilation and Extubation in OR  Informed Consent: I have reviewed the patients History and Physical, chart, labs and discussed the procedure including the risks, benefits and alternatives for the proposed anesthesia with the patient or authorized representative who has indicated his/her understanding and acceptance.     Dental advisory given  Plan Discussed with: CRNA  Anesthesia Plan Comments:        Anesthesia Quick Evaluation

## 2022-02-25 ENCOUNTER — Inpatient Hospital Stay (HOSPITAL_COMMUNITY): Payer: Medicare Other | Admitting: Anesthesiology

## 2022-02-25 ENCOUNTER — Encounter (HOSPITAL_COMMUNITY): Payer: Self-pay | Admitting: Vascular Surgery

## 2022-02-25 ENCOUNTER — Inpatient Hospital Stay (HOSPITAL_COMMUNITY): Payer: Medicare Other | Admitting: Vascular Surgery

## 2022-02-25 ENCOUNTER — Encounter (HOSPITAL_COMMUNITY)
Admission: RE | Disposition: A | Discharge status: Home / Self Care | Payer: Self-pay | Source: Home / Self Care | Attending: Vascular Surgery

## 2022-02-25 ENCOUNTER — Other Ambulatory Visit: Payer: Self-pay

## 2022-02-25 ENCOUNTER — Inpatient Hospital Stay (HOSPITAL_COMMUNITY)
Admission: RE | Admit: 2022-02-25 | Discharge: 2022-02-26 | DRG: 039 | Disposition: A | Discharge status: Home / Self Care | Payer: Medicare Other | Attending: Vascular Surgery | Admitting: Vascular Surgery

## 2022-02-25 DIAGNOSIS — Z79899 Other long term (current) drug therapy: Secondary | ICD-10-CM | POA: Diagnosis not present

## 2022-02-25 DIAGNOSIS — I6522 Occlusion and stenosis of left carotid artery: Secondary | ICD-10-CM

## 2022-02-25 DIAGNOSIS — E7800 Pure hypercholesterolemia, unspecified: Secondary | ICD-10-CM | POA: Diagnosis present

## 2022-02-25 DIAGNOSIS — I6529 Occlusion and stenosis of unspecified carotid artery: Secondary | ICD-10-CM | POA: Diagnosis present

## 2022-02-25 HISTORY — PX: ENDARTERECTOMY: SHX5162

## 2022-02-25 LAB — ABO/RH: ABO/RH(D): O NEG

## 2022-02-25 LAB — POCT ACTIVATED CLOTTING TIME
Activated Clotting Time: 233 seconds
Activated Clotting Time: 257 seconds

## 2022-02-25 SURGERY — ENDARTERECTOMY, CAROTID
Anesthesia: General | Site: Neck | Laterality: Left

## 2022-02-25 MED ORDER — ORAL CARE MOUTH RINSE: 15.0000 mL | Freq: Once | OROMUCOSAL | Status: AC

## 2022-02-25 MED ORDER — ROCURONIUM BROMIDE 10 MG/ML (PF) SYRINGE
PREFILLED_SYRINGE | INTRAVENOUS | Status: DC | PRN
  Administered 2022-02-25: 60 mg via INTRAVENOUS
  Administered 2022-02-25: 20 mg via INTRAVENOUS

## 2022-02-25 MED ORDER — SODIUM CHLORIDE 0.9 % IV SOLN: INTRAVENOUS | Status: DC

## 2022-02-25 MED ORDER — HEPARIN SODIUM (PORCINE) 1000 UNIT/ML IJ SOLN
INTRAMUSCULAR | Status: DC | PRN
  Administered 2022-02-25: 8000 [IU] via INTRAVENOUS
  Administered 2022-02-25: 2000 [IU] via INTRAVENOUS

## 2022-02-25 MED ORDER — ACETAMINOPHEN 500 MG PO TABS: 1000.0000 mg | ORAL_TABLET | Freq: Once | ORAL | Status: AC

## 2022-02-25 MED ORDER — VITAMIN D 25 MCG (1000 UNIT) PO TABS
1000.0000 [IU] | ORAL_TABLET | Freq: Every day | ORAL | Status: DC
Start: 2022-02-25 — End: 2022-02-26
  Administered 2022-02-25: 1000 [IU] via ORAL
  Filled 2022-02-25 (×2): qty 1

## 2022-02-25 MED ORDER — HEPARIN 6000 UNIT IRRIGATION SOLUTION
Status: AC
  Filled 2022-02-25: qty 500

## 2022-02-25 MED ORDER — STERILE WATER FOR IRRIGATION IR SOLN
Status: DC | PRN
  Administered 2022-02-25: 1000 mL

## 2022-02-25 MED ORDER — ASPIRIN 81 MG PO TBEC
81.0000 mg | DELAYED_RELEASE_TABLET | Freq: Every day | ORAL | Status: DC
  Administered 2022-02-25 – 2022-02-26 (×2): 81 mg via ORAL
  Filled 2022-02-25 (×2): qty 1

## 2022-02-25 MED ORDER — POTASSIUM CHLORIDE CRYS ER 20 MEQ PO TBCR: 20.0000 meq | EXTENDED_RELEASE_TABLET | Freq: Every day | ORAL | Status: DC | PRN

## 2022-02-25 MED ORDER — PHENYLEPHRINE HCL-NACL 20-0.9 MG/250ML-% IV SOLN
INTRAVENOUS | Status: DC | PRN
  Administered 2022-02-25: 50 ug/min via INTRAVENOUS

## 2022-02-25 MED ORDER — ALUM & MAG HYDROXIDE-SIMETH 200-200-20 MG/5ML PO SUSP: 15.0000 mL | ORAL | Status: DC | PRN

## 2022-02-25 MED ORDER — PHENYLEPHRINE 80 MCG/ML (10ML) SYRINGE FOR IV PUSH (FOR BLOOD PRESSURE SUPPORT)
PREFILLED_SYRINGE | INTRAVENOUS | Status: DC | PRN
  Administered 2022-02-25: 40 ug via INTRAVENOUS
  Administered 2022-02-25 (×3): 80 ug via INTRAVENOUS
  Administered 2022-02-25: 40 ug via INTRAVENOUS
  Administered 2022-02-25: 80 ug via INTRAVENOUS

## 2022-02-25 MED ORDER — ROSUVASTATIN CALCIUM 5 MG PO TABS
10.0000 mg | ORAL_TABLET | Freq: Every evening | ORAL | Status: DC
  Administered 2022-02-25: 10 mg via ORAL
  Filled 2022-02-25: qty 2

## 2022-02-25 MED ORDER — PROPOFOL 10 MG/ML IV BOLUS
INTRAVENOUS | Status: AC
  Filled 2022-02-25: qty 20

## 2022-02-25 MED ORDER — EPHEDRINE 5 MG/ML INJ
INTRAVENOUS | Status: AC
  Filled 2022-02-25: qty 5

## 2022-02-25 MED ORDER — POLYETHYLENE GLYCOL 3350 17 G PO PACK: 17.0000 g | PACK | Freq: Every day | ORAL | Status: DC | PRN

## 2022-02-25 MED ORDER — GUAIFENESIN-DM 100-10 MG/5ML PO SYRP: 15.0000 mL | ORAL_SOLUTION | ORAL | Status: DC | PRN

## 2022-02-25 MED ORDER — ACETAMINOPHEN 650 MG RE SUPP: 325.0000 mg | RECTAL | Status: DC | PRN

## 2022-02-25 MED ORDER — CHLORHEXIDINE GLUCONATE 0.12 % MT SOLN
OROMUCOSAL | Status: AC
  Administered 2022-02-25: 15 mL via OROMUCOSAL
  Filled 2022-02-25: qty 15

## 2022-02-25 MED ORDER — HEMOSTATIC AGENTS (NO CHARGE) OPTIME
TOPICAL | Status: DC | PRN
  Administered 2022-02-25: 1 via TOPICAL

## 2022-02-25 MED ORDER — FENTANYL CITRATE (PF) 250 MCG/5ML IJ SOLN
INTRAMUSCULAR | Status: AC
  Filled 2022-02-25: qty 5

## 2022-02-25 MED ORDER — ONDANSETRON HCL 4 MG/2ML IJ SOLN
INTRAMUSCULAR | Status: DC | PRN
  Administered 2022-02-25: 4 mg via INTRAVENOUS

## 2022-02-25 MED ORDER — AMISULPRIDE (ANTIEMETIC) 5 MG/2ML IV SOLN: 10.0000 mg | Freq: Once | INTRAVENOUS | Status: DC | PRN

## 2022-02-25 MED ORDER — FENTANYL CITRATE (PF) 250 MCG/5ML IJ SOLN
INTRAMUSCULAR | Status: DC | PRN
  Administered 2022-02-25: 125 ug via INTRAVENOUS
  Administered 2022-02-25: 25 ug via INTRAVENOUS

## 2022-02-25 MED ORDER — CHLORHEXIDINE GLUCONATE CLOTH 2 % EX PADS: 6.0000 | MEDICATED_PAD | Freq: Once | CUTANEOUS | Status: DC

## 2022-02-25 MED ORDER — LACTATED RINGERS IV SOLN: INTRAVENOUS | Status: DC

## 2022-02-25 MED ORDER — MIDAZOLAM HCL 2 MG/2ML IJ SOLN
INTRAMUSCULAR | Status: AC
  Filled 2022-02-25: qty 2

## 2022-02-25 MED ORDER — DOCUSATE SODIUM 100 MG PO CAPS
100.0000 mg | ORAL_CAPSULE | Freq: Every day | ORAL | Status: DC
  Filled 2022-02-25: qty 1

## 2022-02-25 MED ORDER — CEFAZOLIN SODIUM-DEXTROSE 2-4 GM/100ML-% IV SOLN: 2.0000 g | INTRAVENOUS | Status: DC

## 2022-02-25 MED ORDER — LIDOCAINE 2% (20 MG/ML) 5 ML SYRINGE
INTRAMUSCULAR | Status: AC
  Filled 2022-02-25: qty 15

## 2022-02-25 MED ORDER — METOPROLOL TARTRATE 5 MG/5ML IV SOLN: 2.0000 mg | INTRAVENOUS | Status: DC | PRN

## 2022-02-25 MED ORDER — FENTANYL CITRATE (PF) 100 MCG/2ML IJ SOLN
INTRAMUSCULAR | Status: AC
  Filled 2022-02-25: qty 2

## 2022-02-25 MED ORDER — ACETAMINOPHEN 325 MG PO TABS: 325.0000 mg | ORAL_TABLET | ORAL | Status: DC | PRN

## 2022-02-25 MED ORDER — DEXMEDETOMIDINE HCL IN NACL 80 MCG/20ML IV SOLN
INTRAVENOUS | Status: AC
  Filled 2022-02-25: qty 20

## 2022-02-25 MED ORDER — LIDOCAINE 2% (20 MG/ML) 5 ML SYRINGE
INTRAMUSCULAR | Status: DC | PRN
  Administered 2022-02-25: 80 mg via INTRAVENOUS

## 2022-02-25 MED ORDER — CEFAZOLIN SODIUM-DEXTROSE 2-4 GM/100ML-% IV SOLN
2.0000 g | Freq: Three times a day (TID) | INTRAVENOUS | Status: AC
  Administered 2022-02-25 – 2022-02-26 (×2): 2 g via INTRAVENOUS
  Filled 2022-02-25 (×2): qty 100

## 2022-02-25 MED ORDER — MORPHINE SULFATE (PF) 2 MG/ML IV SOLN: 2.0000 mg | INTRAVENOUS | Status: DC | PRN

## 2022-02-25 MED ORDER — OXYCODONE HCL 5 MG PO TABS: 5.0000 mg | ORAL_TABLET | ORAL | Status: DC | PRN

## 2022-02-25 MED ORDER — LABETALOL HCL 5 MG/ML IV SOLN: 10.0000 mg | INTRAVENOUS | Status: DC | PRN

## 2022-02-25 MED ORDER — ONDANSETRON HCL 4 MG/2ML IJ SOLN
INTRAMUSCULAR | Status: AC
  Filled 2022-02-25: qty 4

## 2022-02-25 MED ORDER — PROTAMINE SULFATE 10 MG/ML IV SOLN
INTRAVENOUS | Status: DC | PRN
  Administered 2022-02-25 (×5): 10 mg via INTRAVENOUS

## 2022-02-25 MED ORDER — CHLORHEXIDINE GLUCONATE 0.12 % MT SOLN: 15.0000 mL | Freq: Once | OROMUCOSAL | Status: AC

## 2022-02-25 MED ORDER — PROTAMINE SULFATE 10 MG/ML IV SOLN
INTRAVENOUS | Status: AC
  Filled 2022-02-25: qty 5

## 2022-02-25 MED ORDER — HEPARIN SODIUM (PORCINE) 1000 UNIT/ML IJ SOLN
INTRAMUSCULAR | Status: AC
  Filled 2022-02-25: qty 10

## 2022-02-25 MED ORDER — SODIUM CHLORIDE 0.9 % IV SOLN
0.0125 ug/kg/min | INTRAVENOUS | Status: AC
  Administered 2022-02-25: .05 ug/kg/min via INTRAVENOUS
  Filled 2022-02-25: qty 1000

## 2022-02-25 MED ORDER — HYDRALAZINE HCL 20 MG/ML IJ SOLN: 5.0000 mg | INTRAMUSCULAR | Status: DC | PRN

## 2022-02-25 MED ORDER — PHENOL 1.4 % MT LIQD: 1.0000 | OROMUCOSAL | Status: DC | PRN

## 2022-02-25 MED ORDER — PROPOFOL 10 MG/ML IV BOLUS
INTRAVENOUS | Status: DC | PRN
  Administered 2022-02-25: 130 mg via INTRAVENOUS

## 2022-02-25 MED ORDER — SODIUM CHLORIDE 0.9 % IV SOLN: 500.0000 mL | Freq: Once | INTRAVENOUS | Status: DC | PRN

## 2022-02-25 MED ORDER — FENTANYL CITRATE (PF) 100 MCG/2ML IJ SOLN
25.0000 ug | INTRAMUSCULAR | Status: DC | PRN
  Administered 2022-02-25 (×2): 50 ug via INTRAVENOUS

## 2022-02-25 MED ORDER — ONDANSETRON HCL 4 MG/2ML IJ SOLN
4.0000 mg | Freq: Four times a day (QID) | INTRAMUSCULAR | Status: DC | PRN
Start: 2022-02-25 — End: 2022-02-26

## 2022-02-25 MED ORDER — HEPARIN 6000 UNIT IRRIGATION SOLUTION
Status: DC | PRN
  Administered 2022-02-25: 1

## 2022-02-25 MED ORDER — LIDOCAINE HCL (PF) 1 % IJ SOLN
INTRAMUSCULAR | Status: AC
Start: 2022-02-25 — End: ?
  Filled 2022-02-25: qty 30

## 2022-02-25 MED ORDER — DEXAMETHASONE SODIUM PHOSPHATE 10 MG/ML IJ SOLN
INTRAMUSCULAR | Status: AC
  Filled 2022-02-25: qty 2

## 2022-02-25 MED ORDER — 0.9 % SODIUM CHLORIDE (POUR BTL) OPTIME
TOPICAL | Status: DC | PRN
  Administered 2022-02-25: 500 mL
  Administered 2022-02-25 (×2): 1000 mL

## 2022-02-25 MED ORDER — PANTOPRAZOLE SODIUM 40 MG PO TBEC
40.0000 mg | DELAYED_RELEASE_TABLET | Freq: Every day | ORAL | Status: DC
  Administered 2022-02-25 – 2022-02-26 (×2): 40 mg via ORAL
  Filled 2022-02-25 (×2): qty 1

## 2022-02-25 MED ORDER — SUGAMMADEX SODIUM 200 MG/2ML IV SOLN
INTRAVENOUS | Status: DC | PRN
  Administered 2022-02-25: 200 mg via INTRAVENOUS

## 2022-02-25 MED ORDER — MAGNESIUM SULFATE 2 GM/50ML IV SOLN: 2.0000 g | Freq: Every day | INTRAVENOUS | Status: DC | PRN

## 2022-02-25 MED ORDER — ROCURONIUM BROMIDE 10 MG/ML (PF) SYRINGE
PREFILLED_SYRINGE | INTRAVENOUS | Status: AC
  Filled 2022-02-25: qty 40

## 2022-02-25 MED ORDER — EPHEDRINE SULFATE-NACL 50-0.9 MG/10ML-% IV SOSY
PREFILLED_SYRINGE | INTRAVENOUS | Status: DC | PRN
  Administered 2022-02-25: 5 mg via INTRAVENOUS

## 2022-02-25 MED ORDER — CEFAZOLIN SODIUM-DEXTROSE 2-4 GM/100ML-% IV SOLN
INTRAVENOUS | Status: AC
  Filled 2022-02-25: qty 100

## 2022-02-25 MED ORDER — FERROUS SULFATE 325 (65 FE) MG PO TABS
325.0000 mg | ORAL_TABLET | Freq: Every day | ORAL | Status: DC
  Administered 2022-02-25: 325 mg via ORAL
  Filled 2022-02-25 (×2): qty 1

## 2022-02-25 MED ORDER — BISACODYL 10 MG RE SUPP: 10.0000 mg | Freq: Every day | RECTAL | Status: DC | PRN

## 2022-02-25 MED ORDER — PHENYLEPHRINE 80 MCG/ML (10ML) SYRINGE FOR IV PUSH (FOR BLOOD PRESSURE SUPPORT)
PREFILLED_SYRINGE | INTRAVENOUS | Status: AC
  Filled 2022-02-25: qty 20

## 2022-02-25 MED ORDER — DEXAMETHASONE SODIUM PHOSPHATE 10 MG/ML IJ SOLN
INTRAMUSCULAR | Status: DC | PRN
  Administered 2022-02-25: 10 mg via INTRAVENOUS

## 2022-02-25 MED ORDER — ACETAMINOPHEN 500 MG PO TABS
ORAL_TABLET | ORAL | Status: AC
  Administered 2022-02-25: 1000 mg via ORAL
  Filled 2022-02-25: qty 2

## 2022-02-25 SURGICAL SUPPLY — 53 items
ADH SKN CLS APL DERMABOND .7 (GAUZE/BANDAGES/DRESSINGS) ×2
BAG COUNTER SPONGE SURGICOUNT (BAG) ×1 IMPLANT
BAG SPNG CNTER NS LX DISP (BAG) ×1
CANISTER SUCT 3000ML PPV (MISCELLANEOUS) ×1 IMPLANT
CATH ROBINSON RED A/P 18FR (CATHETERS) ×1 IMPLANT
CLIP VESOCCLUDE MED 24/CT (CLIP) ×1 IMPLANT
CLIP VESOCCLUDE SM WIDE 24/CT (CLIP) ×1 IMPLANT
COVER PROBE W GEL 5X96 (DRAPES) IMPLANT
COVER TRANSDUCER ULTRASND GEL (DISPOSABLE) ×1 IMPLANT
DERMABOND ADVANCED (GAUZE/BANDAGES/DRESSINGS) ×2
DERMABOND ADVANCED .7 DNX12 (GAUZE/BANDAGES/DRESSINGS) ×1 IMPLANT
ELECT REM PT RETURN 9FT ADLT (ELECTROSURGICAL) ×1
ELECTRODE REM PT RTRN 9FT ADLT (ELECTROSURGICAL) ×1 IMPLANT
GLOVE BIO SURGEON STRL SZ 6.5 (GLOVE) IMPLANT
GLOVE BIO SURGEON STRL SZ7.5 (GLOVE) ×1 IMPLANT
GLOVE BIOGEL PI IND STRL 8 (GLOVE) ×1 IMPLANT
GOWN STRL REUS W/ TWL LRG LVL3 (GOWN DISPOSABLE) ×2 IMPLANT
GOWN STRL REUS W/ TWL XL LVL3 (GOWN DISPOSABLE) ×2 IMPLANT
GOWN STRL REUS W/TWL LRG LVL3 (GOWN DISPOSABLE) ×2
GOWN STRL REUS W/TWL XL LVL3 (GOWN DISPOSABLE) ×2
HEMOSTAT SNOW SURGICEL 2X4 (HEMOSTASIS) IMPLANT
KIT BASIN OR (CUSTOM PROCEDURE TRAY) ×1 IMPLANT
KIT SHUNT ARGYLE CAROTID ART 6 (VASCULAR PRODUCTS) IMPLANT
KIT TURNOVER KIT B (KITS) ×1 IMPLANT
LOOP VESSEL MAXI BLUE (MISCELLANEOUS) IMPLANT
LOOP VESSEL MINI RED (MISCELLANEOUS) IMPLANT
NDL HYPO 25GX1X1/2 BEV (NEEDLE) IMPLANT
NDL SPNL 20GX3.5 QUINCKE YW (NEEDLE) IMPLANT
NEEDLE HYPO 25GX1X1/2 BEV (NEEDLE) ×1 IMPLANT
NEEDLE SPNL 20GX3.5 QUINCKE YW (NEEDLE) IMPLANT
NS IRRIG 1000ML POUR BTL (IV SOLUTION) ×3 IMPLANT
PACK CAROTID (CUSTOM PROCEDURE TRAY) ×1 IMPLANT
PAD ARMBOARD 7.5X6 YLW CONV (MISCELLANEOUS) ×2 IMPLANT
PATCH VASC XENOSURE 1CMX6CM (Vascular Products) ×1 IMPLANT
PATCH VASC XENOSURE 1X6 (Vascular Products) IMPLANT
PENCIL BUTTON HOLSTER BLD 10FT (ELECTRODE) IMPLANT
POSITIONER HEAD DONUT 9IN (MISCELLANEOUS) ×1 IMPLANT
SHUNT CAROTID BYPASS 10 (VASCULAR PRODUCTS) IMPLANT
SHUNT CAROTID BYPASS 12FRX15.5 (VASCULAR PRODUCTS) IMPLANT
STOPCOCK 4 WAY LG BORE MALE ST (IV SETS) IMPLANT
SUT ETHILON 3 0 PS 1 (SUTURE) IMPLANT
SUT MNCRL AB 4-0 PS2 18 (SUTURE) ×1 IMPLANT
SUT PROLENE 5 0 C 1 24 (SUTURE) ×1 IMPLANT
SUT PROLENE 6 0 BV (SUTURE) ×1 IMPLANT
SUT PROLENE 7 0 BV1 MDA (SUTURE) IMPLANT
SUT SILK 3 0 (SUTURE)
SUT SILK 3-0 18XBRD TIE 12 (SUTURE) IMPLANT
SUT VIC AB 3-0 SH 27 (SUTURE) ×1
SUT VIC AB 3-0 SH 27X BRD (SUTURE) ×1 IMPLANT
SYR CONTROL 10ML LL (SYRINGE) IMPLANT
TOWEL GREEN STERILE (TOWEL DISPOSABLE) ×1 IMPLANT
TUBING ART PRESS 48 MALE/FEM (TUBING) IMPLANT
WATER STERILE IRR 1000ML POUR (IV SOLUTION) ×1 IMPLANT

## 2022-02-25 NOTE — Discharge Instructions (Signed)
   [facility]  Discharge Instructions   Carotid Surgery  Please refer to the following instructions for your post-procedure care. Your surgeon or physician assistant will discuss any changes with you.  Activity  You are encouraged to walk as much as you can. You can slowly return to normal activities but must avoid strenuous activity and heavy lifting until your doctor tell you it's okay. Avoid activities such as vacuuming or swinging a golf club. You can drive after one week if you are comfortable and you are no longer taking prescription pain medications. It is normal to feel tired for serval weeks after your surgery. It is also normal to have difficulty with sleep habits, eating, and bowel movements after surgery. These will go away with time.  Bathing/Showering  Shower daily after you go home. Do not soak in a bathtub, hot tub, or swim until the incision heals completely.  Incision Care  Shower every day. Clean your incision with mild soap and water. Pat the area dry with a clean towel. You do not need a bandage unless otherwise instructed. Do not apply any ointments or creams to your incision. You may have skin glue on your incision. Do not peel it off. It will come off on its own in about one week. Your incision may feel thickened and raised for several weeks after your surgery. This is normal and the skin will soften over time.   For Men Only: It's okay to shave around the incision but do not shave the incision itself for 2 weeks. It is common to have numbness under your chin that could last for several months.  Diet  Resume your normal diet. There are no special food restrictions following this procedure. A low fat/low cholesterol diet is recommended for all patients with vascular disease. In order to heal from your surgery, it is CRITICAL to get adequate nutrition. Your body requires vitamins, minerals, and protein. Vegetables are the best source of  vitamins and minerals. Vegetables also provide the perfect balance of protein. Processed food has little nutritional value, so try to avoid this.  Medications  Resume taking all of your medications unless your doctor or physician assistant tells you not to. If your incision is causing pain, you may take over-the- counter pain relievers such as acetaminophen (Tylenol). If you were prescribed a stronger pain medication, please be aware these medications can cause nausea and constipation. Prevent nausea by taking the medication with a snack or meal. Avoid constipation by drinking plenty of fluids and eating foods with a high amount of fiber, such as fruits, vegetables, and grains.   Do not take Tylenol if you are taking prescription pain medications.  Follow Up  Our office will schedule a follow up appointment 2-3 weeks following discharge.  Please call us immediately for any of the following conditions  . Increased pain, redness, drainage (pus) from your incision site. . Fever of 101 degrees or higher. . If you should develop stroke (slurred speech, difficulty swallowing, weakness on one side of your body, loss of vision) you should call 911 and go to the nearest emergency room. .  Reduce your risk of vascular disease:  . Stop smoking. If you would like help call [facility] at 1-800-QUIT-NOW (1-800-784-8669) or  at 336-586-4000. . Manage your cholesterol . Maintain a desired weight . Control your diabetes . Keep your blood pressure down .  If you have any questions, please call the office at [phone]. 

## 2022-02-25 NOTE — Plan of Care (Signed)
  Problem: Health Behavior/Discharge Planning: Goal: Ability to manage health-related needs will improve Outcome: Progressing   Problem: Clinical Measurements: Goal: Will remain free from infection Outcome: Progressing Goal: Diagnostic test results will improve Outcome: Progressing   

## 2022-02-25 NOTE — Progress Notes (Signed)
  Day of Surgery Note    Subjective:  no complaints; has walked in recovery room.  Waiting on room to be cleaned.  No trouble swallowing   Vitals:   02/25/22 1445 02/25/22 1500  BP: (!) 113/50 (!) 112/50  Pulse: 69 70  Resp: 10 14  Temp:    SpO2:  96%    Incisions:   clean and dry without hematoma Neuro:  moving all extremities equally;  tongue is midline Cardiac:  regular Lungs:  non labored    Assessment/Plan:  This is a 71 y.o. male who is s/p  Left CEA  -pt doing well in recovery and neuro in tact.  No difficulty swallowing -to 4 east shortly -anticipate discharge tomorrow if uneventful evening.    Leontine Locket, PA-C 02/25/2022 5:03 PM [phone]

## 2022-02-25 NOTE — H&P (Signed)
History and Physical Interval Note:  02/25/2022 10:15 AM  Peter Roberts  has presented today for surgery, with the diagnosis of Left carotid artery stenosis.  The various methods of treatment have been discussed with the patient and family. After consideration of risks, benefits and other options for treatment, the patient has consented to  Procedure(s): LEFT CAROTID ENDARTERECTOMY (Left) as a surgical intervention.  The patient's history has been reviewed, patient examined, no change in status, stable for surgery.  I have reviewed the patient's chart and labs.  Questions were answered to the patient's satisfaction.    Left CEA.  Risk and benefits again discussed.  Marty Heck  Patient name: Peter Roberts MRN: [id number]        DOB: 03/19/1951          Sex: male   REASON FOR CONSULT: Carotid stenosis   HPI: Peter Roberts is a 71 y.o. male, with history of hyperlipidemia that presents for evaluation of carotid artery disease.  He states that his carotid arteries have been followed by his PCP, Dr. Alyson Ingles.  Ultimately he was recently noted to have progression of his carotid disease on duplex on 01/19/22 with a 70-99% left ICA stenosis.  He has no history of stroke or TIA.  He has had no previous neck surgery.   PMH: HLD   PSH: Non-contributory   No family history on file.   SOCIAL HISTORY: Social History         Socioeconomic History   Marital status: Married      Spouse name: Not on file   Number of children: Not on file   Years of education: Not on file   Highest education level: Not on file  Occupational History   Not on file  Tobacco Use   Smoking status: Never   Smokeless tobacco: Never  Substance and Sexual Activity   Alcohol use: No   Drug use: No   Sexual activity: Not on file  Other Topics Concern   Not on file  Social History Narrative   Not on file    Social Determinants of Health    Financial Resource Strain: Not on file  Food Insecurity: Not on file   Transportation Needs: Not on file  Physical Activity: Not on file  Stress: Not on file  Social Connections: Not on file  Intimate Partner Violence: Not on file      Not on File         Current Outpatient Medications  Medication Sig Dispense Refill   rosuvastatin (CRESTOR) 10 MG tablet Take 10 mg by mouth daily.       amoxicillin-clavulanate (AUGMENTIN) 875-125 MG tablet Take 1 tablet by mouth 2 (two) times daily. (Patient not taking: Reported on 02/03/2022) 20 tablet 0   benzonatate (TESSALON) 100 MG capsule Take 1-2 capsules (100-200 mg total) by mouth 3 (three) times daily as needed for cough. (Patient not taking: Reported on 02/03/2022) 40 capsule 0    No current facility-administered medications for this visit.      REVIEW OF SYSTEMS:  '[X]'$  denotes positive finding, '[ ]'$  denotes negative finding Cardiac   Comments:  Chest pain or chest pressure:      Shortness of breath upon exertion:      Short of breath when lying flat:      Irregular heart rhythm:             Vascular      Pain in calf, thigh, or hip brought on by  ambulation:      Pain in feet at night that wakes you up from your sleep:       Blood clot in your veins:      Leg swelling:              Pulmonary      Oxygen at home:      Productive cough:       Wheezing:              Neurologic      Sudden weakness in arms or legs:       Sudden numbness in arms or legs:       Sudden onset of difficulty speaking or slurred speech:      Temporary loss of vision in one eye:       Problems with dizziness:              Gastrointestinal      Blood in stool:       Vomited blood:              Genitourinary      Burning when urinating:       Blood in urine:             Psychiatric      Major depression:              Hematologic      Bleeding problems:      Problems with blood clotting too easily:             Skin      Rashes or ulcers:             Constitutional      Fever or chills:          PHYSICAL  EXAM:     Vitals:    02/03/22 1343 02/03/22 1346  BP: 131/79 (!) 148/83  Pulse: 69 68  Resp: 18    Temp: 97.6 F (36.4 C)    TempSrc: Temporal    SpO2: 98%    Weight: 174 lb (78.9 kg)    Height: '5\' 7"'$  (1.702 m)        GENERAL: The patient is a well-nourished male, in no acute distress. The vital signs are documented above. CARDIAC: There is a regular rate and rhythm.  VASCULAR:  No previous neck incisions PULMONARY: No respiratory distress. ABDOMEN: Soft and non-tender. MUSCULOSKELETAL: There are no major deformities or cyanosis. NEUROLOGIC: No focal weakness or paresthesias are detected.  Cranial nerves II through XII grossly intact SKIN: There are no ulcers or rashes noted. PSYCHIATRIC: The patient has a normal affect.   DATA:    Carotid duplex today there is a 40 to 59% right ICA stenosis high-grade left ICA stenosis 80-99%   Assessment/Plan:   71 year old male presents with asymptomatic high-grade left ICA stenosis greater than 80%.  Discussed that he would benefit from carotid revascularization for stroke risk reduction.  Discussed that current guidelines are surgical intervention for asymptomatic disease for any stenosis over 80%.  I have recommended a left carotid endarterectomy.  I did look at his carotid bifurcation in the office with ultrasound and he appears to have a fairly normal bifurcation.  Disease looks pretty focal in the proximal ICA.  I discussed risk and benefits of surgery including 1% risk of perioperative stroke.  He wants to get scheduled in early September.  All questions answered.       Marty Heck, MD  [facility] Office: [phone]

## 2022-02-25 NOTE — Anesthesia Procedure Notes (Signed)
Procedure Name: Intubation Date/Time: 02/25/2022 10:43 AM  Performed by: Eligha Bridegroom, CRNAPre-anesthesia Checklist: Patient identified, Emergency Drugs available, Suction available, Patient being monitored and Timeout performed Patient Re-evaluated:Patient Re-evaluated prior to induction Preoxygenation: Pre-oxygenation with 100% oxygen Induction Type: IV induction Ventilation: Mask ventilation without difficulty and Oral airway inserted - appropriate to patient size Laryngoscope Size: Mac and 3 Grade View: Grade I Tube type: Oral Tube size: 7.5 mm Number of attempts: 1 Airway Equipment and Method: Stylet Placement Confirmation: ETT inserted through vocal cords under direct vision, positive ETCO2 and breath sounds checked- equal and bilateral Secured at: 22 cm Tube secured with: Tape Dental Injury: Teeth and Oropharynx as per pre-operative assessment

## 2022-02-25 NOTE — Transfer of Care (Signed)
Immediate Anesthesia Transfer of Care Note  Patient: Peter Roberts  Procedure(s) Performed: LEFT CAROTID ENDARTERECTOMY (Left: Neck)  Patient Location: PACU  Anesthesia Type:General  Level of Consciousness: awake, alert  and oriented  Airway & Oxygen Therapy: Patient Spontanous Breathing  Post-op Assessment: Report given to RN and Post -op Vital signs reviewed and stable  Post vital signs: Reviewed and stable  Last Vitals:  Vitals Value Taken Time  BP 120/58 02/25/22 1300  Temp    Pulse 88 02/25/22 1301  Resp 17 02/25/22 1301  SpO2 92 % 02/25/22 1301  Vitals shown include unvalidated device data.  Last Pain:  Vitals:   02/25/22 0952  TempSrc:   PainSc: 0-No pain         Complications: No notable events documented.

## 2022-02-25 NOTE — Anesthesia Procedure Notes (Signed)
Arterial Line Insertion Start/End9/11/2021 10:00 AM, 02/25/2022 10:17 AM Performed by: Nolon Nations, MD, anesthesiologist  Patient location: Pre-op. Preanesthetic checklist: patient identified, IV checked, site marked, risks and benefits discussed, surgical consent, monitors and equipment checked, pre-op evaluation, timeout performed and anesthesia consent Lidocaine 1% used for infiltration Right, radial was placed Catheter size: 20 G Hand hygiene performed  and maximum sterile barriers used   Attempts: 1 Procedure performed using ultrasound guided technique. Ultrasound Notes:anatomy identified, needle tip was noted to be adjacent to the nerve/plexus identified, no ultrasound evidence of intravascular and/or intraneural injection and image(s) printed for medical record Following insertion, dressing applied and Biopatch. Post procedure assessment: normal and unchanged  Post procedure complications: second provider assisted. Patient tolerated the procedure well with no immediate complications.

## 2022-02-25 NOTE — Op Note (Signed)
OPERATIVE NOTE  PROCEDURE:   1.  left carotid endarterectomy with bovine patch angioplasty 2.  left intraoperative carotid ultrasound  PRE-OPERATIVE DIAGNOSIS: left asymptomatic high grade carotid stenosis >80%  POST-OPERATIVE DIAGNOSIS: same as above   SURGEON: Marty Heck, MD  ASSISTANT(S): Leontine Locket, PA  ANESTHESIA: general  ESTIMATED BLOOD LOSS: Minimal  FINDING(S): High grade ulcerated left carotid plaque.  After endarterectomy and patch angioplasty, no intimal flaps visualized on intraoperative duplex and excellent doppler flow in the ICA and ECA.    SPECIMEN(S):  Carotid plaque (sent to Pathology)  INDICATIONS:   Peter Roberts is a 71 y.o. male who presents with left asymptomatic high grade carotid stenosis >80%.  I discussed with the patient the risks, benefits, and alternatives to carotid endarterectomy.  The patient is aware that the risks of carotid endarterectomy include but are not limited to: bleeding, infection, stroke, myocardial infarction, death, cranial nerve injuries both temporary and permanent, neck hematoma, possible airway compromise, labile blood pressure post-operatively, cerebral hyperperfusion syndrome, and possible need for additional interventions in the future. The patient is aware of the risks and agrees to proceed forward with the procedure.  DESCRIPTION: After full informed written consent was obtained from the patient, the patient was brought back to the operating room and placed supine upon the operating table.  Prior to induction, the patient received IV antibiotics.  After obtaining adequate anesthesia, the patient was placed into semi-Fowler position with a shoulder roll in place and the patient's neck slightly hyperextended and rotated away from the surgical site.  The patient was prepped in the standard fashion for a left carotid endarterectomy.  I made an incision anterior to the sternocleidomastoid muscle and dissected down  through the subcutaneous tissue.  The platysma was opened with electrocautery.  I then used Bovie cautery and blunt dissection to dissect through the underlying platysma and to mobilize the anterior border of the sternocleidomastoid as well as the internal jugular vein laterally.  The facial vein was ligated with 3-0 silk and surgical clips and divided.  After identifying the carotid artery I used Metzenbaum scissors to bluntly dissect the common carotid artery and then controlled this with a umbilical tape.  At this point in time the patient was given 100 units/kg of IV heparin and we checked an ACT to ensure it was greater than 250.  I then carried my dissection cephalad and mobilized the external carotid artery and superior thyroid artery and controlled each of these with a vessel loop.  I then dissected out the internal carotid artery well past the distal plaque.  The internal carotid artery was then controlled with a umbilical tape as well. I was careful to identify the vagus nerve between the internal jugular and common carotid and this was presereved.  I was also careful to identify and preserve the hypoglossal nerve and this was preserved.    Once our ACT was confirmed, I proceeded by clamping the internal carotid artery with a bulldog clamp first.  The proximal common carotid artery was controlled with a angled debakey clamp.  The external carotid was controlled with a vessel loop.  I subsequently opened the common carotid artery with an 11 blade scalpel in longitudinal fashion and extended the arteriotomy with Potts scissors onto the ICA past the distal plaque.  There was excellent pulsatile backbleeding from the ICA and I elected not to place a shunt.  I then used a Garment/textile technologist and performed a endarterectomy starting in the common  carotid artery.  The external carotid artery was endarterectomized with an eversion technique and I was careful to feather the distal ICA plaque.  The specimen was passed  off the field.  The endarterectomy site was then flushed with heparinized saline and I was careful to ensure there were no flaps in the endarterectomy site.  I tacked down the distal flap with a 7-0 Prolene.  I then brought a bovine carotid patch on the field and this was sewn in place with a running anastomosis using a 6-0 Prolene distally and a 5-0 proximal.  The bovine patch was trimmed accordingly.  The artery was flushed antegrade and retrograde prior to completion of the patch.  Once the patch was complete, I flushed up the external carotid artery first prior to releasing the internal carotid artery clamp.  An intraoperative duplex was performed that showed no evidence of any flaps.  There was excellent doppler flow in the ICA and ECA.  Once I was happy with the intraoperative ultrasound the patient was given protamine for reversal.  I used surgicel snow to get hemostasis around the patch.  Ultimately the platysma was closed in running fashion with 3-0 Vicryl.  The skin was closed with a running 4-0 Monocryl.  Dermabond was applied with a dry sterile dressing.  The patient was awakened from anesthesia with no new neurological deficit and taken to PACU in stable condition.    COMPLICATIONS: None  CONDITION: Stable  Marty Heck, MD [facility] Office: [place]   02/25/2022, 12:46 PM

## 2022-02-26 ENCOUNTER — Encounter (HOSPITAL_COMMUNITY): Payer: Self-pay | Admitting: Vascular Surgery

## 2022-02-26 LAB — BASIC METABOLIC PANEL
Anion gap: 8 (ref 5–15)
BUN: 17 mg/dL (ref 8–23)
CO2: 22 mmol/L (ref 22–32)
Calcium: 9.1 mg/dL (ref 8.9–10.3)
Chloride: 103 mmol/L (ref 98–111)
Creatinine, Ser: 1.17 mg/dL (ref 0.61–1.24)
GFR, Estimated: >60 mL/min (ref >60)
Glucose, Bld: 134 mg/dL — ABNORMAL HIGH (ref 70–99)
Potassium: 4.1 mmol/L (ref 3.5–5.1)
Sodium: 133 mmol/L — ABNORMAL LOW (ref 135–145)

## 2022-02-26 LAB — LIPID PANEL
Cholesterol: 140 mg/dL (ref 0–200)
HDL: 57 mg/dL (ref >40)
LDL Cholesterol: 76 mg/dL (ref 0–99)
Total CHOL/HDL Ratio: 2.5 RATIO
Triglycerides: 36 mg/dL (ref <150)
VLDL: 7 mg/dL (ref 0–40)

## 2022-02-26 LAB — CBC
HCT: 41.2 % (ref 39.0–52.0)
Hemoglobin: 14 g/dL (ref 13.0–17.0)
MCH: 29.9 pg (ref 26.0–34.0)
MCHC: 34 g/dL (ref 30.0–36.0)
MCV: 88 fL (ref 80.0–100.0)
Platelets: 241 10*3/uL (ref 150–400)
RBC: 4.68 MIL/uL (ref 4.22–5.81)
RDW: 13.5 % (ref 11.5–15.5)
WBC: 8.7 10*3/uL (ref 4.0–10.5)
nRBC: 0 % (ref 0.0–0.2)

## 2022-02-26 MED ORDER — ROSUVASTATIN CALCIUM 20 MG PO TABS: 20.0000 mg | ORAL_TABLET | Freq: Every evening | ORAL | 2 refills | Status: DC

## 2022-02-26 MED ORDER — OXYCODONE-ACETAMINOPHEN 5-325 MG PO TABS: 1.0000 | ORAL_TABLET | Freq: Four times a day (QID) | ORAL | 0 refills | Status: DC | PRN

## 2022-02-26 NOTE — Progress Notes (Signed)
Mobility Specialist Progress Note:   02/26/22 0854  Mobility  Activity Ambulated with assistance in hallway  Level of Assistance Independent  Assistive Device None  Distance Ambulated (ft) 1500 ft  Activity Response Tolerated well  $Mobility charge 1 Mobility   Pt received EOB willing to participate in mobility. No complaints of pain. Left EOB with call bell in reach and all needs met.   [facility] Ipek Westra Mobility Specialist

## 2022-02-26 NOTE — Anesthesia Postprocedure Evaluation (Signed)
Anesthesia Post Note  Patient: Peter Roberts  Procedure(s) Performed: LEFT CAROTID ENDARTERECTOMY (Left: Neck)     Patient location during evaluation: PACU Anesthesia Type: General Level of consciousness: sedated and patient cooperative Pain management: pain level controlled Vital Signs Assessment: post-procedure vital signs reviewed and stable Respiratory status: spontaneous breathing Cardiovascular status: stable Anesthetic complications: no   No notable events documented.  Last Vitals:  Vitals:   02/26/22 0427 02/26/22 0839  BP: (!) 126/59 (!) 145/58  Pulse: 68 72  Resp: 17 18  Temp: 36.6 C 36.8 C  SpO2: 96% 99%    Last Pain:  Vitals:   02/26/22 0839  TempSrc: Oral  PainSc:                  Nolon Nations

## 2022-02-26 NOTE — Progress Notes (Addendum)
PHARMACIST LIPID MONITORING   Peter Roberts is a 71 y.o. male admitted on 02/25/2022 with L carotid endarterectomy.  Pharmacy has been consulted to optimize lipid-lowering therapy with the indication of secondary prevention for clinical ASCVD.  Recent Labs:  Lipid Panel (last 6 months):   Lab Results  Component Value Date   CHOL 140 02/26/2022   TRIG 36 02/26/2022   HDL 57 02/26/2022   CHOLHDL 2.5 02/26/2022   VLDL 7 02/26/2022   LDLCALC 76 02/26/2022    Hepatic function panel (last 6 months):   Lab Results  Component Value Date   AST 23 02/19/2022   ALT 24 02/19/2022   ALKPHOS 65 02/19/2022   BILITOT 0.9 02/19/2022    SCr (since admission):   Serum creatinine: 1.17 mg/dL 02/26/22 0422 Estimated creatinine clearance: 55.7 mL/min  Current therapy and lipid therapy tolerance Current lipid-lowering therapy: crestor 10 mg Previous lipid-lowering therapies (if applicable): same as above  Documented or reported allergies or intolerances to lipid-lowering therapies (if applicable): None  Assessment:   Patient agrees with changes to lipid-lowering therapy  Plan:    1.Statin intensity (high intensity recommended for all patients regardless of the LDL):  Add or increase statin to high intensity.  2.Add ezetimibe (if any one of the following):   Not indicated at this time.  3.Refer to lipid clinic:   No  4.Follow-up with:  Primary care provider - Maury Dus, MD  5.Follow-up labs after discharge:  Changes in lipid therapy were made. Check a lipid panel in 8-12 weeks then annually.      Antonietta Jewel, PharmD, [place] Clinical Pharmacist  Phone: [phone] 02/26/2022 8:07 AM  Please check AMION for all [facility] phone numbers After 10:00 PM, call [place] [phone]

## 2022-02-26 NOTE — Progress Notes (Addendum)
  Progress Note    02/26/2022 7:30 AM 1 Day Post-Op  Subjective:  Has some soreness at his incision line but well controlled with oral pain medications. Has been walking around the halls     Vitals:   02/26/22 0340 02/26/22 0427  BP: (!) 126/59 (!) 126/59  Pulse: 68 68  Resp: 16 17  Temp: 97.9 F (36.6 C) 97.9 F (36.6 C)  SpO2: 96% 96%    Physical Exam: Lungs:  nonlabored Incisions:  L sided neck incision without hematoma Neuro: moving extremities equally. Tongue is midline, no facial droop. Bilateral extremities equal strength 5/5   CBC    Component Value Date/Time   WBC 8.7 02/26/2022 0422   RBC 4.68 02/26/2022 0422   HGB 14.0 02/26/2022 0422   HCT 41.2 02/26/2022 0422   PLT 241 02/26/2022 0422   MCV 88.0 02/26/2022 0422   MCH 29.9 02/26/2022 0422   MCHC 34.0 02/26/2022 0422   RDW 13.5 02/26/2022 0422    BMET    Component Value Date/Time   NA 133 (L) 02/26/2022 0422   K 4.1 02/26/2022 0422   CL 103 02/26/2022 0422   CO2 22 02/26/2022 0422   GLUCOSE 134 (H) 02/26/2022 0422   BUN 17 02/26/2022 0422   CREATININE 1.17 02/26/2022 0422   CALCIUM 9.1 02/26/2022 0422   GFRNONAA >60 02/26/2022 0422    INR    Component Value Date/Time   INR 1.0 02/19/2022 1100     Intake/Output Summary (Last 24 hours) at 02/26/2022 0730 Last data filed at 02/26/2022 0355 Gross per 24 hour  Intake 200 ml  Output 1000 ml  Net -800 ml     Assessment/Plan:  71 y.o. male is 1 day post op, s/p: left CEA   -Neuro intact, no difficulty swallowing. Moving all extremities equally -Palpable radial pulses bilaterally -Neck incision c/d/l with minimal bruising -D/c home today with follow up in 2 weeks  Vicente Serene, PA-C [facility] [phone] 02/26/2022 7:30 AM  I have seen and evaluated the patient. I agree with the PA note as documented above.  Postop day 1 status post left carotid endarterectomy for high-grade asymptomatic stenosis.  Looks great this  morning.  Neuro intact.  Neck looks good with no hematoma.  Plan discharge today on aspirin.  I will see him in 2 to 3 weeks for incision check.  Discussed he call with questions or concerns.  Marty Heck, MD [facility] [place] Office: [phone]

## 2022-02-26 NOTE — Progress Notes (Signed)
Order received to discharge patient.  Telemetry monitor removed and CCMD notified.  PIV access removed.  Discharge instructions, follow up, medications and instructions for their use discussed with patient. 

## 2022-02-27 ENCOUNTER — Telehealth: Payer: Self-pay | Admitting: Vascular Surgery

## 2022-02-27 NOTE — Telephone Encounter (Signed)
-----   Message from [facility] [place] sent at 02/26/2022  7:51 AM EDT ----- S/p left CEA, 9/6  Follow up in 2-3 weeks with incision check

## 2022-03-05 NOTE — Discharge Summary (Signed)
Discharge Summary     Peter Roberts [birth year] 71 y.o. male  [id number]  Admission Date: 02/25/2022  Discharge Date: 02/26/2022  Physician: No att. providers found  Admission Diagnosis: Carotid stenosis [I65.29] Asymptomatic carotid artery stenosis without infarction, left [I65.22]  Discharge Day Diagnosis:  Carotid stenosis [I65.29] Asymptomatic carotid artery stenosis without infarction, left [facility]  Hospital Course:  The patient was admitted to the hospital and taken to the operating room on 02/25/2022 and underwent left carotid endarterectomy.  The pt tolerated the procedure well and was transported to the PACU in good condition.  By POD 1, the pt was neuro intact. No hematoma development.  The remainder of the hospital course consisted of increasing mobilization and increasing intake of solids without difficulty.  Patient was discharged home on POD 1   No results for input(s): "NA", "K", "CL", "CO2", "GLUCOSE", "BUN", "CALCIUM" in the last 72 hours.  Invalid input(s): "CRATININE" No results for input(s): "WBC", "HGB", "HCT", "PLT" in the last 72 hours. No results for input(s): "INR" in the last 72 hours.   Discharge Instructions     Call MD for:  redness, tenderness, or signs of infection (pain, swelling, redness, odor or green/yellow discharge around incision site)   Complete by: As directed    Call MD for:  severe uncontrolled pain   Complete by: As directed    Call MD for:  temperature >100.4   Complete by: As directed    Diet - low sodium heart healthy   Complete by: As directed    Discharge wound care:   Complete by: As directed    Keep incision clean and dry   Increase activity slowly   Complete by: As directed        Discharge Diagnosis:  Carotid stenosis [I65.29] Asymptomatic carotid artery stenosis without infarction, left [I65.22]  Secondary Diagnosis: Patient Active Problem List   Diagnosis Date Noted   Asymptomatic carotid artery  stenosis without infarction, left 02/25/2022   Carotid stenosis 02/03/2022   Past Medical History:  Diagnosis Date   High cholesterol     Allergies as of 02/26/2022       Reactions   Sulfa Antibiotics Rash        Medication List     TAKE these medications    aspirin EC 81 MG tablet Take 81 mg by mouth daily. Swallow whole.   cholecalciferol 25 MCG (1000 UNIT) tablet Commonly known as: VITAMIN D3 Take 1,000 Units by mouth daily.   Iron 90 (18 Fe) MG Tabs Take 90 mg by mouth daily.   magnesium oxide 400 (240 Mg) MG tablet Commonly known as: MAG-OX Take 400 mg by mouth daily.   OIL OF OREGANO PO Take 1 capsule by mouth daily.   OVER THE COUNTER MEDICATION Take 3 capsules by mouth daily. Prostagenix   OVER THE COUNTER MEDICATION Take 1 tablet by mouth daily. Circ O2   OVER THE COUNTER MEDICATION Take 2 tablets by mouth daily. Active P K   oxyCODONE-acetaminophen 5-325 MG tablet Commonly known as: Percocet Take 1 tablet by mouth every 6 (six) hours as needed for severe pain.   rosuvastatin 20 MG tablet Commonly known as: CRESTOR Take 1 tablet (20 mg total) by mouth every evening. What changed:  medication strength how much to take   TURMERIC CURCUMIN PO Take 2 capsules by mouth daily.               Discharge Care Instructions  (From admission, onward)  Start     Ordered   02/26/22 0000  Discharge wound care:       Comments: Keep incision clean and dry   02/26/22 0750             Discharge Instructions:   [facility] Discharge Instructions Carotid Endarterectomy (CEA)  Please refer to the following instructions for your post-procedure care. Your surgeon or physician assistant will discuss any changes with you.  Activity  You are encouraged to walk as much as you can. You can slowly return to normal activities but must avoid strenuous activity and heavy lifting until your doctor tell you  it's OK. Avoid activities such as vacuuming or swinging a golf club. You can drive after one week if you are comfortable and you are no longer taking prescription pain medications. It is normal to feel tired for serval weeks after your surgery. It is also normal to have difficulty with sleep habits, eating, and bowel movements after surgery. These will go away with time.  Bathing/Showering  You may shower after you come home. Do not soak in a bathtub, hot tub, or swim until the incision heals completely.  Incision Care  Shower every day. Clean your incision with mild soap and water. Pat the area dry with a clean towel. You do not need a bandage unless otherwise instructed. Do not apply any ointments or creams to your incision. You may have skin glue on your incision. Do not peel it off. It will come off on its own in about one week. Your incision may feel thickened and raised for several weeks after your surgery. This is normal and the skin will soften over time. For Men Only: It's OK to shave around the incision but do not shave the incision itself for 2 weeks. It is common to have numbness under your chin that could last for several months.  Diet  Resume your normal diet. There are no special food restrictions following this procedure. A low fat/low cholesterol diet is recommended for all patients with vascular disease. In order to heal from your surgery, it is CRITICAL to get adequate nutrition. Your body requires vitamins, minerals, and protein. Vegetables are the best source of vitamins and minerals. Vegetables also provide the perfect balance of protein. Processed food has little nutritional value, so try to avoid this.  Medications  Resume taking all of your medications unless your doctor or physician assistant tells you not to.  If your incision is causing pain, you may take over-the- counter pain relievers such as acetaminophen (Tylenol). If you were prescribed a stronger pain medication,  please be aware these medications can cause nausea and constipation.  Prevent nausea by taking the medication with a snack or meal. Avoid constipation by drinking plenty of fluids and eating foods with a high amount of fiber, such as fruits, vegetables, and grains. Do not take Tylenol if you are taking prescription pain medications.  Follow Up  Our office will schedule a follow up appointment 2-3 weeks following discharge.  Please call us immediately for any of the following conditions  Increased pain, redness, drainage (pus) from your incision site. Fever of 101 degrees or higher. If you should develop stroke (slurred speech, difficulty swallowing, weakness on one side of your body, loss of vision) you should call 911 and go to the nearest emergency room.  Reduce your risk of vascular disease:  Stop smoking. If you would like help call [facility] at 1-800-QUIT-NOW (564)067-8829) or  [place] at [phone]. Manage your cholesterol Maintain a desired weight Control your diabetes Keep your blood pressure down  If you have any questions, please call the office at [phone].   Disposition: Home  Patient's condition: is Excellent  Follow up: 1. [facility] in 2 weeks.   Vicente Serene, PA-C [facility] [phone]   --- For [facility] Registry use ---   Modified Rankin score at D/C (0-6): 0  IV medication needed for:  1. Hypertension: No 2. Hypotension: No  Post-op Complications: No  1. Post-op CVA or TIA: No  If yes: Event classification (right eye, left eye, right cortical, left cortical, verterobasilar, other):   If yes: Timing of event (intra-op, <6 hrs post-op, >=6 hrs post-op, unknown):   2. CN injury: No  If yes: CN  injuried   3. Myocardial infarction: No  If yes: Dx by (EKG or clinical, Troponin):   4.  CHF: No  5.  Dysrhythmia (new): No  6. Wound infection: No  7. Reperfusion symptoms: No  8. Return to OR: No  If yes: return to OR  for (bleeding, neurologic, other CEA incision, other):   Discharge medications: Statin use:  Yes ASA use:  Yes   Beta blocker use:  No ACE-Inhibitor use:  No  ARB use:  No CCB use: No P2Y12 Antagonist use: No, '[ ]'$  Plavix, '[ ]'$  Plasugrel, '[ ]'$  Ticlopinine, '[ ]'$  Ticagrelor, '[ ]'$  Other, '[ ]'$  No for medical reason, '[ ]'$  Non-compliant, '[ ]'$  Not-indicated Anti-coagulant use:  No, '[ ]'$  Warfarin, '[ ]'$  Rivaroxaban, '[ ]'$  Dabigatran,

## 2022-03-17 ENCOUNTER — Ambulatory Visit (INDEPENDENT_AMBULATORY_CARE_PROVIDER_SITE_OTHER): Payer: Medicare Other | Admitting: Physician Assistant

## 2022-03-17 VITALS — BP 115/75 | HR 77 | Temp 98.0°F | Resp 20 | Ht 67.0 in | Wt 173.5 lb

## 2022-03-17 DIAGNOSIS — I6522 Occlusion and stenosis of left carotid artery: Secondary | ICD-10-CM

## 2022-03-17 NOTE — Progress Notes (Signed)
  POST OPERATIVE OFFICE NOTE    CC:  F/u for surgery  HPI:  This is a 71 y.o. male who is s/p left carotid endarterectomy by Dr. Carlis Abbott on 02/25/2022 due to asymptomatic high-grade stenosis.  Patient denies any strokelike symptoms including slurring speech, changes in vision, or one-sided weakness.  He did have occasional headaches postoperatively however these have resolved.  He believes the incision on his left neck is healing well.  He is taking an aspirin and a statin daily.  He has been walking at a slow pace for exercise however would like to get back into higher intensity exercise.  Allergies  Allergen Reactions   Sulfa Antibiotics Rash    Current Outpatient Medications  Medication Sig Dispense Refill   aspirin EC 81 MG tablet Take 81 mg by mouth daily. Swallow whole.     cholecalciferol (VITAMIN D3) 25 MCG (1000 UNIT) tablet Take 1,000 Units by mouth daily.     Ferrous Sulfate (IRON) 90 (18 Fe) MG TABS Take 90 mg by mouth daily.     magnesium oxide (MAG-OX) 400 (240 Mg) MG tablet Take 400 mg by mouth daily.     OIL OF OREGANO PO Take 1 capsule by mouth daily.     OVER THE COUNTER MEDICATION Take 3 capsules by mouth daily. Prostagenix     OVER THE COUNTER MEDICATION Take 1 tablet by mouth daily. Circ O2     OVER THE COUNTER MEDICATION Take 2 tablets by mouth daily. Active P K     oxyCODONE-acetaminophen (PERCOCET) 5-325 MG tablet Take 1 tablet by mouth every 6 (six) hours as needed for severe pain. 20 tablet 0   rosuvastatin (CRESTOR) 20 MG tablet Take 1 tablet (20 mg total) by mouth every evening. 90 tablet 2   TURMERIC CURCUMIN PO Take 2 capsules by mouth daily.     No current facility-administered medications for this visit.     ROS:  See HPI  Physical Exam:  Vitals:   03/17/22 1025 03/17/22 1026  BP: 130/77 115/75  Pulse: 77   Resp: 20   Temp: 98 F (36.7 C)   TempSrc: Temporal   SpO2: 100%   Weight: 173 lb 8 oz (78.7 kg)   Height: '5\' 7"'$  (1.702 m)     Incision:  Left neck incision healing well without hematoma or dehiscence Extremities: Moving all extremities well with symmetrical radial pulses Neuro: Cranial nerves grossly intact  Assessment/Plan:  This is a 71 y.o. male who is s/p: Left carotid endarterectomy for high-grade asymptomatic stenosis  -Patient has not had any neurological symptoms perioperatively including slurring speech, changes in vision, or one-sided weakness -Left neck incision healing well -Ok to increase walking pace and resume light weight training; he will wait an additional 2 weeks before higher intensity weight training -Continue aspirin and statin daily -Recheck carotid duplex in 6 months   Dagoberto Ligas, PA-C [facility] [phone]  Clinic MD:  Carlis Abbott

## 2022-03-18 ENCOUNTER — Other Ambulatory Visit: Payer: Self-pay

## 2022-03-18 DIAGNOSIS — I6529 Occlusion and stenosis of unspecified carotid artery: Secondary | ICD-10-CM

## 2022-04-22 DIAGNOSIS — C61 Malignant neoplasm of prostate: Secondary | ICD-10-CM

## 2022-04-22 HISTORY — DX: Malignant neoplasm of prostate: C61

## 2022-04-24 HISTORY — PX: PROSTATE BIOPSY: SHX241

## 2022-05-04 DIAGNOSIS — Z8042 Family history of malignant neoplasm of prostate: Secondary | ICD-10-CM

## 2022-05-04 DIAGNOSIS — R972 Elevated prostate specific antigen [PSA]: Secondary | ICD-10-CM

## 2022-05-04 HISTORY — DX: Family history of malignant neoplasm of prostate: Z80.42

## 2022-05-04 HISTORY — DX: Elevated prostate specific antigen (PSA): R97.20

## 2022-05-15 ENCOUNTER — Other Ambulatory Visit (HOSPITAL_COMMUNITY): Payer: Self-pay | Admitting: Urology

## 2022-05-15 DIAGNOSIS — C61 Malignant neoplasm of prostate: Secondary | ICD-10-CM

## 2022-05-29 ENCOUNTER — Ambulatory Visit (HOSPITAL_COMMUNITY)
Admission: RE | Admit: 2022-05-29 | Discharge: 2022-05-29 | Disposition: A | Discharge status: Home / Self Care | Payer: Medicare Other | Source: Ambulatory Visit | Attending: Urology | Admitting: Urology

## 2022-05-29 DIAGNOSIS — C61 Malignant neoplasm of prostate: Secondary | ICD-10-CM | POA: Diagnosis present

## 2022-05-29 MED ORDER — PIFLIFOLASTAT F 18 (PYLARIFY) INJECTION
9.0000 | Freq: Once | INTRAVENOUS | Status: AC
  Administered 2022-05-29: 8.42 via INTRAVENOUS

## 2022-07-24 ENCOUNTER — Telehealth: Payer: Self-pay | Admitting: Radiation Oncology

## 2022-07-24 NOTE — Telephone Encounter (Signed)
Lvm to schedule CON with Dr. Manning 

## 2022-07-27 ENCOUNTER — Telehealth: Payer: Self-pay | Admitting: Radiation Oncology

## 2022-07-27 DIAGNOSIS — U071 COVID-19: Secondary | ICD-10-CM

## 2022-07-27 HISTORY — DX: COVID-19: U07.1

## 2022-07-27 NOTE — Telephone Encounter (Signed)
LVM to schedule CON with Dr. Manning 

## 2022-07-29 ENCOUNTER — Telehealth: Payer: Self-pay

## 2022-07-29 NOTE — Progress Notes (Signed)
GU Location of Tumor / Histology: Prostate Ca  If Prostate Cancer, Gleason Score is (4 + 3) and PSA is (6.35  on 02/2022)  Biopsies       05/29/2022 Dr. Milford Cage NM PET (PSMA) Skull to Base of Mid Thigh CLINICAL DATA:  Prostate carcinoma with biochemical recurrence. PSA equal 6.35 prostate biopsy 02/25/2022.   Past/Anticipated interventions by urology, if any:   Dr. Harold Barban   Past/Anticipated interventions by medical oncology, if any:  NA  Weight changes, if any:  No weight loss said that he gain some weight.  IPSS: 10 SHIM:  19  Bowel/Bladder complaints, if any:  No  Nausea/Vomiting, if any: No  Pain issues, if any:  0/10  SAFETY ISSUES: Prior radiation? No Pacemaker/ICD? No Possible current pregnancy? Male  Is the patient on methotrexate? No  Current Complaints / other details:

## 2022-07-29 NOTE — Telephone Encounter (Signed)
RN spoke with Mr. Makar about testing COVID+ on 07/27/2022 with up coming appointment for 08/03/2022.  RN asked if having any symptoms if so, what symptoms.  He stated fatigue, coughing, nausea/vomiting and mild fever.  Per Ashlyn Bruning if patient doesn't have any symptoms during next 7 days could come in as long as mask in on.  RN expressed that it is important to call if any symptoms should worsen to reschedule appointment patient agreed.

## 2022-08-03 ENCOUNTER — Telehealth: Payer: Self-pay

## 2022-08-03 ENCOUNTER — Ambulatory Visit
Admission: RE | Admit: 2022-08-03 | Discharge: 2022-08-03 | Disposition: A | Discharge status: Home / Self Care | Payer: Medicare Other | Source: Ambulatory Visit | Attending: Radiation Oncology | Admitting: Radiation Oncology

## 2022-08-03 ENCOUNTER — Encounter: Payer: Self-pay | Admitting: Radiation Oncology

## 2022-08-03 VITALS — BP 179/80 | HR 80 | Temp 97.1°F | Resp 18 | Ht 67.0 in | Wt 187.5 lb

## 2022-08-03 DIAGNOSIS — C61 Malignant neoplasm of prostate: Secondary | ICD-10-CM | POA: Insufficient documentation

## 2022-08-03 DIAGNOSIS — E7800 Pure hypercholesterolemia, unspecified: Secondary | ICD-10-CM | POA: Diagnosis not present

## 2022-08-03 DIAGNOSIS — Z7982 Long term (current) use of aspirin: Secondary | ICD-10-CM | POA: Insufficient documentation

## 2022-08-03 DIAGNOSIS — Z8616 Personal history of COVID-19: Secondary | ICD-10-CM | POA: Diagnosis not present

## 2022-08-03 DIAGNOSIS — I10 Essential (primary) hypertension: Secondary | ICD-10-CM | POA: Diagnosis not present

## 2022-08-03 HISTORY — DX: Personal history of malignant neoplasm of prostate: Z85.46

## 2022-08-03 HISTORY — DX: Pure hypercholesterolemia, unspecified: E78.00

## 2022-08-03 HISTORY — DX: Essential (primary) hypertension: I10

## 2022-08-03 NOTE — Telephone Encounter (Signed)
RN called Peter Roberts to follow up on tested positive for COVID-19 on 07/27/2022.  Reports seeing a doctor on 07/28/2022 taking medication and staying hydrated per his doctors orders on 5 day course of Molnupiravir and OTC Tylenol as needed.  At this time [name] denies having fever, body aches, chills, diarrhea, and nausea/vomiting.  Reports only feeling fatigue and mild cough.  Reports will be in for nurse evaluation at 10:30 am and consultation at 11:00 am.  Reminded [name] to wear mask or one will be provided to him at check-in.  Appreciative of the follow-up call.

## 2022-08-03 NOTE — Progress Notes (Signed)
Radiation Oncology         [phone] ________________________________  Initial Outpatient Consultation  Name: Peter Roberts MRN: [id number]  Date: 08/03/2022  DOB: [birth year]  [id number], Herbie [place], MD (Inactive)  Remi Haggard, MD   REFERRING PHYSICIAN: Remi Haggard, MD  DIAGNOSIS: 72 y.o. 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    ICD-10-CM   1. Malignant neoplasm of prostate ([place])  C61       HISTORY OF PRESENT ILLNESS: Peter Roberts is a 72 y.o. male with a diagnosis of prostate cancer. He was noted to have variable PSA levels beginning with 4.62 in November of 2019, 4.31 in March of 2020, 3.4 in June of 2021, and 4.29 in June of 2022. The patient had opted for surveillance at that time. His PSA raised to 6.35 from 5.30 on 08/27/2021 to 6.35 on 02/25/2022. Accordingly, the patient proceeded to transrectal ultrasound with 12 biopsies of the prostate by Dr. Milford Cage on 04/24/22.  The prostate volume measured 32.87 cc.  Out of 12 core biopsies, 4 were positive.  The maximum Gleason score was 4+3, and this was seen in right base and right base lateral. Additionally a Gleason score of 3+4 was seen in the right mid and gleason score of 3+3 was seen in the right mid lateral. PSMA PET on 05/29/2022 showed no evidence of metastatic disease.   The patient reviewed the biopsy results with his urologist and he has kindly been referred today for discussion of potential radiation treatment options.   PREVIOUS RADIATION THERAPY: No  PAST MEDICAL HISTORY:  Past Medical History:  Diagnosis Date   COVID-19 07/27/2022   Elevated PSA 05/04/2022   Family history of prostate cancer 05/04/2022   03/06/2022, 09/01/2021, 01/31/2021   High cholesterol    History of prostate cancer    Hypercholesterolemia    Hypertension       PAST SURGICAL HISTORY: Past Surgical History:  Procedure Laterality Date   CARPAL TUNNEL RELEASE Left 12/2021    ENDARTERECTOMY Left 02/25/2022   Procedure: LEFT CAROTID ENDARTERECTOMY;  Surgeon: Marty Heck, MD;  Location: [facility] OR;  Service: Vascular;  Laterality: Left;   KNEE ARTHROSCOPY Right 2002   PROSTATE BIOPSY  04/24/2022    FAMILY HISTORY:  Family History  Problem Relation Age of Onset   Lung disease Mother    Heart attack Father    Heart disease Father     SOCIAL HISTORY:  Social History   Socioeconomic History   Marital status: Widowed    Spouse name: Not on file   Number of children: Not on file   Years of education: Not on file   Highest education level: Not on file  Occupational History   Not on file  Tobacco Use   Smoking status: Never    Passive exposure: Never   Smokeless tobacco: Never  Vaping Use   Vaping Use: Never used  Substance and Sexual Activity   Alcohol use: No   Drug use: No   Sexual activity: Not on file  Other Topics Concern   Not on file  Social History Narrative   Not on file   Social Determinants of Health   Financial Resource Strain: Not on file  Food Insecurity: No Food Insecurity (08/03/2022)   Hunger Vital Sign    Worried About Running Out of Food in the Last Year: Never true    Ran Out of Food in the Last Year: Never  true  Transportation Needs: No Transportation Needs (08/03/2022)   PRAPARE - Hydrologist (Medical): No    Lack of Transportation (Non-Medical): No  Physical Activity: Not on file  Stress: Not on file  Social Connections: Not on file  Intimate Partner Violence: Not At Risk (08/03/2022)   Humiliation, Afraid, Rape, and Kick questionnaire    Fear of Current or Ex-Partner: No    Emotionally Abused: No    Physically Abused: No    Sexually Abused: No    ALLERGIES: Sulfa antibiotics  MEDICATIONS:  Current Outpatient Medications  Medication Sig Dispense Refill   LAGEVRIO 200 MG CAPS capsule SMARTSIG:4 Capsule(s) By Mouth Every 12 Hours     aspirin EC 81 MG tablet Take 81 mg by mouth  daily. Swallow whole. (Patient not taking: Reported on 08/03/2022)     cholecalciferol (VITAMIN D3) 25 MCG (1000 UNIT) tablet Take 1,000 Units by mouth daily.     diclofenac Sodium (VOLTAREN) 1 % GEL as directed Externally     Ferrous Sulfate (IRON) 90 (18 Fe) MG TABS Take 90 mg by mouth daily.     magnesium oxide (MAG-OX) 400 (240 Mg) MG tablet Take 400 mg by mouth daily.     OIL OF OREGANO PO Take 1 capsule by mouth daily.     OVER THE COUNTER MEDICATION Take 3 capsules by mouth daily. Prostagenix     OVER THE COUNTER MEDICATION Take 1 tablet by mouth daily. Circ O2     OVER THE COUNTER MEDICATION Take 2 tablets by mouth daily. Active P K     rosuvastatin (CRESTOR) 20 MG tablet Take 1 tablet (20 mg total) by mouth every evening. 90 tablet 2   TURMERIC CURCUMIN PO Take 2 capsules by mouth daily.     No current facility-administered medications for this encounter.    REVIEW OF SYSTEMS:  On review of systems, the patient reports that he is doing well overall. He denies any chest pain, shortness of breath, cough, fevers, chills, night sweats, unintended weight changes. He denies any bowel disturbances, and denies abdominal pain, nausea or vomiting. He denies any new musculoskeletal or joint aches or pains. His IPSS was 10 , indicating moderate urinary symptoms.  His SHIM was 19, indicating he has mild erectile dysfunction. A complete review of systems is obtained and is otherwise negative.   PHYSICAL EXAM:  Wt Readings from Last 3 Encounters:  08/03/22 187 lb 8 oz (85 kg)  03/17/22 173 lb 8 oz (78.7 kg)  02/25/22 173 lb (78.5 kg)   Temp Readings from Last 3 Encounters:  08/03/22 (!) 97.1 F (36.2 C) (Temporal)  03/17/22 98 F (36.7 C) (Temporal)  02/26/22 98.2 F (36.8 C) (Oral)   BP Readings from Last 3 Encounters:  08/03/22 (!) 179/80  03/17/22 115/75  02/26/22 (!) 145/58   Pulse Readings from Last 3 Encounters:  08/03/22 80  03/17/22 77  02/26/22 72   Pain Assessment Pain  Score: 0-No pain/10  In general this is a well appearing gentleman in no acute distress. He's alert and oriented x4 and appropriate throughout the examination. Cardiopulmonary assessment is negative for acute distress, and he exhibits normal effort.    KPS = 90  100 - Normal; no complaints; no evidence of disease. 90   - Able to carry on normal activity; minor signs or symptoms of disease. 80   - Normal activity with effort; some signs or symptoms of disease. 70   - Cares  for self; unable to carry on normal activity or to do active work. 60   - Requires occasional assistance, but is able to care for most of his personal needs. 50   - Requires considerable assistance and frequent medical care. 48   - Disabled; requires special care and assistance. 36   - Severely disabled; hospital admission is indicated although death not imminent. 69   - Very sick; hospital admission necessary; active supportive treatment necessary. 10   - Moribund; fatal processes progressing rapidly. 0     - Dead  Karnofsky DA, Abelmann [place], Craver LS and Burchenal [facility] [phone]) The use of the nitrogen mustards in the palliative treatment of carcinoma: with particular reference to bronchogenic carcinoma Cancer 1 634-56  LABORATORY DATA:  Lab Results  Component Value Date   WBC 8.7 02/26/2022   HGB 14.0 02/26/2022   HCT 41.2 02/26/2022   MCV 88.0 02/26/2022   PLT 241 02/26/2022   Lab Results  Component Value Date   NA 133 (L) 02/26/2022   K 4.1 02/26/2022   CL 103 02/26/2022   CO2 22 02/26/2022   Lab Results  Component Value Date   ALT 24 02/19/2022   AST 23 02/19/2022   ALKPHOS 65 02/19/2022   BILITOT 0.9 02/19/2022     RADIOGRAPHY:  EXAM: NUCLEAR MEDICINE PET SKULL BASE TO THIGH FINDINGS: NECK: No radiotracer activity in neck lymph nodes.Incidental CT finding: None. CHEST: No radiotracer accumulation within mediastinal or hilar lymph nodes. No suspicious pulmonary nodules on the CT scan. Incidental CT  finding: None. ABDOMEN/PELVIS: Prostate: Heterogeneous mild radiotracer activity within the prostate gland without clear focal lesion identified. Seminal vesicles normal. Lymph nodes: No abnormal radiotracer accumulation within pelvic or abdominal nodes. Liver: No evidence of liver metastasis. Incidental CT finding: None. SKELETON: No focal activity to suggest skeletal metastasis. IMPRESSION: 1. No clear evidence prostate adenocarcinoma within the gland. 2. No evidence metastatic adenopathy in the pelvis or periaortic retroperitoneum. 3. No evidence of visceral metastasis or skeletal metastasis.   IMPRESSION/PLAN: 1. 72 y.o. gentleman with Stage T1 adenocarcinoma of the prostate with Gleason Score of 4+3, and PSA of 6.35. We discussed the patient's workup and outlined the nature of prostate cancer in this setting. The patient's T stage, Gleason's score, and PSA put him into the intermediate, unfavorable risk group. Accordingly, he is eligible for a variety of potential treatment options including brachytherapy, 5.5-8 weeks of external radiation, or prostatectomy. We discussed the available radiation techniques, and focused on the details and logistics of delivery. We discussed and outlined the risks, benefits, short and long-term effects associated with radiotherapy and compared and contrasted these with prostatectomy. We discussed the role of SpaceOAR gel in reducing the rectal toxicity associated with radiotherapy.   He appears to have a good understanding of his disease and our treatment recommendations which are of curative intent.  He was encouraged to ask questions that were answered to his stated satisfaction.  At the conclusion of our conversation, the patient is interested in moving forward with external beam radiation. 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  We will let Dr. Milford Cage know he is ready for fiducial marker and  SpaceOAR gel placement. Patient will be scheduled for CT simulation following this.   We personally spent 60 minutes in this encounter including chart review, reviewing radiological studies, meeting face-to-face with the patient, entering orders and completing documentation.  Leona Singleton, PA-C    Tyler Pita, MD  [facility] Direct Dial: [phone]  Fax: [phone] [email or web address]  Skype  [email or web address]

## 2022-08-03 NOTE — Progress Notes (Signed)
Introduced myself to the patient as the prostate nurse navigator.  No barriers to care identified at this time.  He is here to discuss his radiation treatment options.  Currently patient is not scheduled for a surgery consult, I informed patient we would be sure to follow up with urology to ensure he receives a surgical consult as well.  I gave him my business card and asked him to call me with questions or concerns.  Verbalized understanding.

## 2022-08-07 ENCOUNTER — Other Ambulatory Visit: Payer: Self-pay | Admitting: Urology

## 2022-08-11 ENCOUNTER — Telehealth: Payer: Self-pay | Admitting: *Deleted

## 2022-08-11 NOTE — Telephone Encounter (Signed)
Called patient to inform of fid. markers and space oar placement on 08-25-22 and his sim on 09-03-22 @ 9 am @ [facility], informed patient to arrive with a full bladder, mailed patient an appt. card

## 2022-08-19 ENCOUNTER — Encounter (HOSPITAL_BASED_OUTPATIENT_CLINIC_OR_DEPARTMENT_OTHER): Payer: Self-pay | Admitting: Urology

## 2022-08-20 ENCOUNTER — Encounter (HOSPITAL_BASED_OUTPATIENT_CLINIC_OR_DEPARTMENT_OTHER): Payer: Self-pay | Admitting: Urology

## 2022-08-20 NOTE — Progress Notes (Addendum)
Spoke w/ via phone for pre-op interview--- pt Lab needs dos---- no              Lab results------ no COVID test -----patient states asymptomatic no test needed Arrive at ------- 0600 on 08-25-2022 NPO after MN NO Solid Food.  Clear liquids from MN until--- 0500 Med rec completed Medications to take morning of surgery ----- none Diabetic medication ----- Patient instructed no nail polish to be worn day of surgery Patient instructed to bring photo id and insurance card day of surgery Patient aware to have Driver (ride ) -- daughter , katie/ caregiver for 24 hours after surgery -- pt stated was not aware he had to have anyone to stay with him.  Stated he not had anyone stay with him before after surgery. His children live close by and they can come by and check on him and he can call if he needs anything.  Pt verbalized understanding per anesthesia recommends caregiver 24 hours after surgery due to having had anesthesia. Patient Special Instructions ----- will do fleet enema night before surgery Pre-Op special Istructions ----- n/a Patient verbalized understanding of instructions that were given at this phone interview. Patient denies shortness of breath, chest pain, fever, cough at this phone interview.

## 2022-08-22 ENCOUNTER — Encounter (HOSPITAL_COMMUNITY): Payer: Self-pay

## 2022-08-22 ENCOUNTER — Inpatient Hospital Stay (HOSPITAL_COMMUNITY)
Admission: EM | Admit: 2022-08-22 | Discharge: 2022-08-25 | DRG: 394 | Disposition: A | Discharge status: Home / Self Care | Payer: Medicare Other | Attending: Internal Medicine | Admitting: Internal Medicine

## 2022-08-22 DIAGNOSIS — I6522 Occlusion and stenosis of left carotid artery: Secondary | ICD-10-CM | POA: Diagnosis present

## 2022-08-22 DIAGNOSIS — C61 Malignant neoplasm of prostate: Secondary | ICD-10-CM | POA: Insufficient documentation

## 2022-08-22 DIAGNOSIS — Z8042 Family history of malignant neoplasm of prostate: Secondary | ICD-10-CM

## 2022-08-22 DIAGNOSIS — K625 Hemorrhage of anus and rectum: Secondary | ICD-10-CM | POA: Diagnosis present

## 2022-08-22 DIAGNOSIS — N401 Enlarged prostate with lower urinary tract symptoms: Secondary | ICD-10-CM | POA: Diagnosis present

## 2022-08-22 DIAGNOSIS — E860 Dehydration: Secondary | ICD-10-CM | POA: Diagnosis present

## 2022-08-22 DIAGNOSIS — Z79899 Other long term (current) drug therapy: Secondary | ICD-10-CM

## 2022-08-22 DIAGNOSIS — K922 Gastrointestinal hemorrhage, unspecified: Secondary | ICD-10-CM

## 2022-08-22 DIAGNOSIS — E871 Hypo-osmolality and hyponatremia: Secondary | ICD-10-CM | POA: Diagnosis present

## 2022-08-22 DIAGNOSIS — R351 Nocturia: Secondary | ICD-10-CM | POA: Diagnosis present

## 2022-08-22 DIAGNOSIS — I129 Hypertensive chronic kidney disease with stage 1 through stage 4 chronic kidney disease, or unspecified chronic kidney disease: Secondary | ICD-10-CM | POA: Diagnosis present

## 2022-08-22 DIAGNOSIS — I10 Essential (primary) hypertension: Secondary | ICD-10-CM | POA: Diagnosis present

## 2022-08-22 DIAGNOSIS — I6523 Occlusion and stenosis of bilateral carotid arteries: Secondary | ICD-10-CM | POA: Diagnosis present

## 2022-08-22 DIAGNOSIS — Z882 Allergy status to sulfonamides status: Secondary | ICD-10-CM

## 2022-08-22 DIAGNOSIS — K6289 Other specified diseases of anus and rectum: Principal | ICD-10-CM | POA: Diagnosis present

## 2022-08-22 DIAGNOSIS — E785 Hyperlipidemia, unspecified: Secondary | ICD-10-CM | POA: Diagnosis present

## 2022-08-22 DIAGNOSIS — D509 Iron deficiency anemia, unspecified: Secondary | ICD-10-CM | POA: Diagnosis present

## 2022-08-22 DIAGNOSIS — K5900 Constipation, unspecified: Secondary | ICD-10-CM | POA: Diagnosis present

## 2022-08-22 DIAGNOSIS — N1831 Chronic kidney disease, stage 3a: Secondary | ICD-10-CM | POA: Diagnosis present

## 2022-08-22 DIAGNOSIS — Z8249 Family history of ischemic heart disease and other diseases of the circulatory system: Secondary | ICD-10-CM

## 2022-08-22 NOTE — ED Triage Notes (Signed)
Pt comes from home via [facility], has been constipated for the past week, has tried multiple laxatives, tonight began to have lower abd pain and bright red blood, and urinary retention that started tonight, hx of prostate cancer.

## 2022-08-23 ENCOUNTER — Emergency Department (HOSPITAL_COMMUNITY): Payer: Medicare Other

## 2022-08-23 ENCOUNTER — Other Ambulatory Visit: Payer: Self-pay

## 2022-08-23 DIAGNOSIS — E860 Dehydration: Secondary | ICD-10-CM | POA: Diagnosis present

## 2022-08-23 DIAGNOSIS — K625 Hemorrhage of anus and rectum: Secondary | ICD-10-CM | POA: Diagnosis present

## 2022-08-23 DIAGNOSIS — C61 Malignant neoplasm of prostate: Secondary | ICD-10-CM | POA: Diagnosis present

## 2022-08-23 DIAGNOSIS — D509 Iron deficiency anemia, unspecified: Secondary | ICD-10-CM | POA: Diagnosis present

## 2022-08-23 DIAGNOSIS — Z8042 Family history of malignant neoplasm of prostate: Secondary | ICD-10-CM | POA: Diagnosis not present

## 2022-08-23 DIAGNOSIS — K6289 Other specified diseases of anus and rectum: Secondary | ICD-10-CM | POA: Diagnosis present

## 2022-08-23 DIAGNOSIS — R351 Nocturia: Secondary | ICD-10-CM | POA: Diagnosis present

## 2022-08-23 DIAGNOSIS — Z79899 Other long term (current) drug therapy: Secondary | ICD-10-CM | POA: Diagnosis not present

## 2022-08-23 DIAGNOSIS — I129 Hypertensive chronic kidney disease with stage 1 through stage 4 chronic kidney disease, or unspecified chronic kidney disease: Secondary | ICD-10-CM | POA: Diagnosis present

## 2022-08-23 DIAGNOSIS — Z8249 Family history of ischemic heart disease and other diseases of the circulatory system: Secondary | ICD-10-CM | POA: Diagnosis not present

## 2022-08-23 DIAGNOSIS — E871 Hypo-osmolality and hyponatremia: Secondary | ICD-10-CM | POA: Diagnosis present

## 2022-08-23 DIAGNOSIS — K5900 Constipation, unspecified: Secondary | ICD-10-CM | POA: Diagnosis present

## 2022-08-23 DIAGNOSIS — I6523 Occlusion and stenosis of bilateral carotid arteries: Secondary | ICD-10-CM | POA: Diagnosis present

## 2022-08-23 DIAGNOSIS — Z882 Allergy status to sulfonamides status: Secondary | ICD-10-CM | POA: Diagnosis not present

## 2022-08-23 DIAGNOSIS — N1831 Chronic kidney disease, stage 3a: Secondary | ICD-10-CM | POA: Diagnosis present

## 2022-08-23 DIAGNOSIS — E785 Hyperlipidemia, unspecified: Secondary | ICD-10-CM | POA: Diagnosis present

## 2022-08-23 DIAGNOSIS — N401 Enlarged prostate with lower urinary tract symptoms: Secondary | ICD-10-CM | POA: Diagnosis present

## 2022-08-23 LAB — COMPREHENSIVE METABOLIC PANEL
ALT: 15 U/L (ref 0–44)
AST: 18 U/L (ref 15–41)
Albumin: 3.8 g/dL (ref 3.5–5.0)
Alkaline Phosphatase: 72 U/L (ref 38–126)
Anion gap: 6 (ref 5–15)
BUN: 22 mg/dL (ref 8–23)
CO2: 20 mmol/L — ABNORMAL LOW (ref 22–32)
Calcium: 8.4 mg/dL — ABNORMAL LOW (ref 8.9–10.3)
Chloride: 103 mmol/L (ref 98–111)
Creatinine, Ser: 0.91 mg/dL (ref 0.61–1.24)
GFR, Estimated: >60 mL/min (ref >60)
Glucose, Bld: 132 mg/dL — ABNORMAL HIGH (ref 70–99)
Potassium: 4.1 mmol/L (ref 3.5–5.1)
Sodium: 129 mmol/L — ABNORMAL LOW (ref 135–145)
Total Bilirubin: 0.6 mg/dL (ref 0.3–1.2)
Total Protein: 7.5 g/dL (ref 6.5–8.1)

## 2022-08-23 LAB — CBC
HCT: 37 % — ABNORMAL LOW (ref 39.0–52.0)
Hemoglobin: 12.4 g/dL — ABNORMAL LOW (ref 13.0–17.0)
MCH: 28.8 pg (ref 26.0–34.0)
MCHC: 33.5 g/dL (ref 30.0–36.0)
MCV: 85.8 fL (ref 80.0–100.0)
Platelets: 292 10*3/uL (ref 150–400)
RBC: 4.31 MIL/uL (ref 4.22–5.81)
RDW: 13.3 % (ref 11.5–15.5)
WBC: 18.3 10*3/uL — ABNORMAL HIGH (ref 4.0–10.5)
nRBC: 0 % (ref 0.0–0.2)

## 2022-08-23 LAB — LACTIC ACID, PLASMA: Lactic Acid, Venous: 0.7 mmol/L (ref 0.5–1.9)

## 2022-08-23 LAB — HIV ANTIBODY (ROUTINE TESTING W REFLEX): HIV Screen 4th Generation wRfx: NONREACTIVE

## 2022-08-23 LAB — TYPE AND SCREEN
ABO/RH(D): O NEG
Antibody Screen: NEGATIVE

## 2022-08-23 MED ORDER — SODIUM CHLORIDE 0.9 % IV BOLUS
1000.0000 mL | Freq: Once | INTRAVENOUS | Status: AC
  Administered 2022-08-23: 1000 mL via INTRAVENOUS

## 2022-08-23 MED ORDER — OXYCODONE HCL 5 MG PO TABS: 5.0000 mg | ORAL_TABLET | ORAL | Status: DC | PRN

## 2022-08-23 MED ORDER — SODIUM CHLORIDE 0.9 % IV BOLUS (SEPSIS)
1000.0000 mL | Freq: Once | INTRAVENOUS | Status: AC
  Administered 2022-08-23: 1000 mL via INTRAVENOUS

## 2022-08-23 MED ORDER — ONDANSETRON HCL 4 MG/2ML IJ SOLN: 4.0000 mg | Freq: Four times a day (QID) | INTRAMUSCULAR | Status: DC | PRN

## 2022-08-23 MED ORDER — ROSUVASTATIN CALCIUM 20 MG PO TABS
20.0000 mg | ORAL_TABLET | Freq: Every evening | ORAL | Status: DC
  Administered 2022-08-23 – 2022-08-24 (×2): 20 mg via ORAL
  Filled 2022-08-23 (×2): qty 1

## 2022-08-23 MED ORDER — SODIUM CHLORIDE 0.9 % IV SOLN
1000.0000 mL | INTRAVENOUS | Status: DC
  Administered 2022-08-23: 1000 mL via INTRAVENOUS

## 2022-08-23 MED ORDER — ACETAMINOPHEN 325 MG PO TABS: 650.0000 mg | ORAL_TABLET | Freq: Four times a day (QID) | ORAL | Status: DC | PRN

## 2022-08-23 MED ORDER — IOHEXOL 300 MG/ML  SOLN
100.0000 mL | Freq: Once | INTRAMUSCULAR | Status: AC | PRN
  Administered 2022-08-23: 100 mL via INTRAVENOUS

## 2022-08-23 MED ORDER — TRAZODONE HCL 50 MG PO TABS: 25.0000 mg | ORAL_TABLET | Freq: Every evening | ORAL | Status: DC | PRN

## 2022-08-23 MED ORDER — ONDANSETRON HCL 4 MG PO TABS: 4.0000 mg | ORAL_TABLET | Freq: Four times a day (QID) | ORAL | Status: DC | PRN

## 2022-08-23 MED ORDER — SODIUM CHLORIDE 0.9 % IV SOLN: INTRAVENOUS | Status: DC

## 2022-08-23 MED ORDER — PIPERACILLIN-TAZOBACTAM 3.375 G IVPB 30 MIN
3.3750 g | Freq: Once | INTRAVENOUS | Status: AC
  Administered 2022-08-23: 3.375 g via INTRAVENOUS
  Filled 2022-08-23: qty 50

## 2022-08-23 MED ORDER — METOPROLOL TARTRATE 5 MG/5ML IV SOLN
5.0000 mg | Freq: Four times a day (QID) | INTRAVENOUS | Status: DC | PRN
  Administered 2022-08-24: 5 mg via INTRAVENOUS
  Filled 2022-08-23 (×2): qty 5

## 2022-08-23 MED ORDER — ACETAMINOPHEN 650 MG RE SUPP: 650.0000 mg | Freq: Four times a day (QID) | RECTAL | Status: DC | PRN

## 2022-08-23 MED ORDER — PIPERACILLIN-TAZOBACTAM 3.375 G IVPB
3.3750 g | Freq: Three times a day (TID) | INTRAVENOUS | Status: DC
  Administered 2022-08-23 – 2022-08-25 (×6): 3.375 g via INTRAVENOUS
  Filled 2022-08-23 (×5): qty 50

## 2022-08-23 NOTE — H&P (Signed)
History and Physical  Peter Roberts [id number] DOB: 1950/11/17 DOA: 08/22/2022   PCP: Maury Dus, MD (Inactive)   Patient coming from: Home   Chief Complaint: Rectal bleed   HPI: Peter Roberts is a 72 y.o. male with medical history significant for BPH, known carotid stenosis, recent diagnosis of prostate cancer awaiting initiation of radiation therapy, being admitted to the hospital with rectal discomfort and bright red blood per rectum.  Patient states he was in his usual state of health until about 2 weeks ago, when he started feeling that he was constipated, over the last week he has taken several rounds of stool softeners, took an enema at home.  There was some relief from this.  However couple days ago, he noticed that he was having some bright red blood per rectum.  He denies any associated chest pain, fevers, nausea, vomiting.  Continued bleeding, he came to the ER for evaluation.  ED Course: Here in the emergency department, workup revealed elevated blood pressure 148/77, pulse 72, saturating 97% on room air.  Currently he is resting comfortably.  Lab work as noted below with hyponatremia of 129, CO2 20, glucose 132, white blood cell count 18,000, hemoglobin 12.4.  The skin is mentioned below.  Patient was given IV fluids, empiric IV Zosyn and hospitalist was contacted for admission.  Currently the patient is resting comfortably with his daughter at the bedside.  Review of Systems: Please see HPI for pertinent positives and negatives. A complete 10 system review of systems are otherwise negative.  Past Medical History:  Diagnosis Date   Arthritis    BPH associated with nocturia    Carotid stenosis, asymptomatic, bilateral 2017   followed by pcp and vascular dr Carlis Abbott;    s/p left CEA 02-25-2022 for stenosis >80%; (post op note in epic 03-17-2022 healing well)   last duplex in epic 02-03-2022 right ICA 40-59% and left ICA 80-99%   Elevated blood pressure reading without diagnosis of  hypertension    Family history of prostate cancer    History of anal fissures    Hyperlipidemia    Malignant neoplasm prostate ([place]) 04/2022   urologist--- dr newsome/  radiation oncologist--- dr Tammi Klippel;  dx 11/ 2023,  Gleason 4+3   Microcytic anemia    Past Surgical History:  Procedure Laterality Date   CARPAL TUNNEL RELEASE Left 12/2021   COLONOSCOPY  2019   ENDARTERECTOMY Left 02/25/2022   Procedure: LEFT CAROTID ENDARTERECTOMY;  Surgeon: Marty Heck, MD;  Location: [place] [facility];  Service: Vascular;  Laterality: Left;   KNEE ARTHROSCOPY Right 2002    Social History:  reports that he has never smoked. He has never been exposed to tobacco smoke. He has never used smokeless tobacco. He reports that he does not drink alcohol and does not use drugs.   Allergies  Allergen Reactions   Sulfa Antibiotics Hives    Family History  Problem Relation Age of Onset   Lung disease Mother    Heart attack Father    Heart disease Father      Prior to Admission medications   Medication Sig Start Date End Date Taking? Authorizing Provider  Cholecalciferol (VITAMIN D3) 125 MCG (5000 UT) capsule Take 5,000 Units by mouth daily.   Yes [provider]  diclofenac Sodium (VOLTAREN) 1 % GEL Apply 4 g topically 4 (four) times daily as needed (pain).   Yes [provider]  Docusate Calcium (STOOL SOFTENER PO) Take 1 capsule by mouth daily as needed (constipation).  Yes [provider]  Ferrous Sulfate (IRON) 90 (18 Fe) MG TABS Take 90 mg by mouth every evening.   Yes [provider]  Magnesium 500 MG CAPS Take 1 capsule by mouth every evening.   Yes [provider]  Oil of Oregano 1500 MG CAPS Take 1 capsule by mouth daily.   Yes [provider]  OVER THE COUNTER MEDICATION Take 3 capsules by mouth daily after lunch. Prostagenix   Yes [provider]  OVER THE COUNTER MEDICATION Take 1 tablet by mouth daily. Circ O2   Yes [provider]  Probiotic Product (PROBIOTIC DAILY) CAPS Take 1 capsule by mouth daily.   Yes [provider]  rosuvastatin (CRESTOR) 20 MG tablet Take 1 tablet (20 mg total) by mouth every evening. Patient taking differently: Take 20 mg by mouth every evening. 02/26/22  Yes Marty Heck, MD    Physical Exam: BP (!) 148/77 (BP Location: Left Arm)   Pulse 72   Temp 98.1 F (36.7 C) (Oral)   Resp 18   SpO2 97%   General:  Alert, oriented, calm, in no acute distress resting comfortably, pleasant and cooperative and looks nontoxic Eyes: EOMI, clear conjuctivae, white sclerea Neck: supple, no masses, trachea mildline  Cardiovascular: RRR, no murmurs or rubs, no peripheral edema  Respiratory: clear to auscultation bilaterally, no wheezes, no crackles  Abdomen: soft, nontender, nondistended, normal bowel tones heard  Skin: dry, no rashes  Musculoskeletal: no joint effusions, normal range of motion  Psychiatric: appropriate affect, normal speech  Neurologic: extraocular muscles intact, clear speech, moving all extremities with intact sensorium          Labs on Admission:  Basic Metabolic Panel: Recent Labs  Lab 08/23/22 0323  NA 129*  K 4.1  CL 103  CO2 20*  GLUCOSE 132*  BUN 22  CREATININE 0.91  CALCIUM 8.4*   Liver Function Tests: Recent Labs  Lab 08/23/22 0323  AST 18  ALT 15  ALKPHOS 72  BILITOT 0.6  PROT 7.5  ALBUMIN 3.8   No results for input(s): "LIPASE", "AMYLASE" in the last 168 hours. No results for input(s): "AMMONIA" in the last 168 hours. CBC: Recent Labs  Lab 08/23/22 0323  WBC 18.3*  HGB 12.4*  HCT 37.0*  MCV 85.8  PLT 292   Cardiac Enzymes: No results for input(s): "CKTOTAL", "CKMB", "CKMBINDEX", "TROPONINI" in the last 168 hours.  BNP (last 3 results) No results for input(s): "BNP" in the last 8760 hours.  ProBNP (last 3 results) No results for input(s): "PROBNP" in the last 8760 hours.  CBG: No results for input(s):  "GLUCAP" in the last 168 hours.  Radiological Exams on Admission: CT ABDOMEN PELVIS W CONTRAST  Result Date: 08/23/2022 CLINICAL DATA:  Lower GI bleed.  Abdominal pain and constipation. EXAM: CT ABDOMEN AND PELVIS WITH CONTRAST TECHNIQUE: Multidetector CT imaging of the abdomen and pelvis was performed using the standard protocol following bolus administration of intravenous contrast. RADIATION DOSE REDUCTION: This exam was performed according to the departmental dose-optimization program which includes automated exposure control, adjustment of the mA and/or kV according to patient size and/or use of iterative reconstruction technique. CONTRAST:  183m OMNIPAQUE IOHEXOL 300 MG/ML  SOLN COMPARISON:  None Available. FINDINGS: Lower chest: Mild atelectasis is present at the lung bases. Hepatobiliary: No focal liver abnormality is seen. No gallstones, gallbladder wall thickening, or biliary dilatation. Pancreas: Unremarkable. No pancreatic ductal dilatation or surrounding inflammatory changes. Spleen: Normal in size without  focal abnormality. Adrenals/Urinary Tract: The adrenal glands are within normal limits. The kidneys enhance symmetrically. There is a tiny cyst containing a calcification in the mid right kidney. No hydronephrosis bilaterally. The bladder is unremarkable. Stomach/Bowel: The stomach is within normal limits. No bowel obstruction, free air, or pneumatosis. There is marked thickening of the walls of the rectum with surrounding fat stranding, compatible with proctitis. The appendix is not visualized on exam. Vascular/Lymphatic: Aortic atherosclerosis. No enlarged abdominal or pelvic lymph nodes. Reproductive: Prostate gland is mildly enlarged. Other: No abdominopelvic ascites. Musculoskeletal: Degenerative changes are present in the thoracolumbar spine. No acute or suspicious osseous abnormality. IMPRESSION: 1. March rectal wall thickening with surrounding fat stranding, compatible with proctitis. 2.  Aortic atherosclerosis. Electronically Signed   By: Brett Fairy M.D.   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  Lactate pending.  Vital signs are stable. -Observation admission -Continue IV fluids -Nausea control as needed -Continue empiric IV Zosyn Active Problems:   Asymptomatic carotid artery stenosis without infarction, left   Prostate cancer (HCC)   Rectal bleed -hemoglobin down from 14-12.4, currently no evidence of active bleeding, will avoid blood thinners and trend   Hyponatremia -mild, and asymptomatic.  Will hydrate, and recheck sodium level in the morning   DVT prophylaxis: SCDs  Code Status: Full code  Admission status: Obs   Time spent: 35 minutes  Glory Graefe Neva Seat MD [facility] Pager [phone]  If 7PM-7AM, please contact night-coverage [email or web address] Password [email or web address]  08/23/2022, 8:10 AM

## 2022-08-23 NOTE — ED Provider Notes (Signed)
[facility] Provider Note  CSN: [id number] Arrival date & time: 08/22/22 2336  Chief Complaint(s) Constipation  HPI Peter Roberts is a 72 y.o. male with a past medical history listed below including newly diagnosed prostate cancer currently being prepped for radiation therapy who presents to the emergency department with 1 week of constipation symptoms.  States that he has been taking laxatives and stool softeners at home with minimal relief.  This evening he began having rectal pain and noticed bright red blood with bowel movements.  He endorsed a history of hemorrhoids.  No diverticulosis.  She is endorsing some suprapubic discomfort and difficulty voiding.  States that he was able to void just prior to arrival which did relieve the suprapubic discomfort.  No fevers or chills.  No nausea or vomiting.  No other physical complaints.  The history is provided by the patient.    Past Medical History Past Medical History:  Diagnosis Date   Arthritis    BPH associated with nocturia    Carotid stenosis, asymptomatic, bilateral 2017   followed by pcp and vascular dr Carlis Abbott;    s/p left CEA 02-25-2022 for stenosis >80%; (post op note in epic 03-17-2022 healing well)   last duplex in epic 02-03-2022 right ICA 40-59% and left ICA 80-99%   Elevated blood pressure reading without diagnosis of hypertension    Family history of prostate cancer    History of anal fissures    Hyperlipidemia    Malignant neoplasm prostate ([place]) 04/2022   urologist--- dr newsome/  radiation oncologist--- dr Tammi Klippel;  dx 11/ 2023,  Gleason 4+3   Microcytic anemia    Patient Active Problem List   Diagnosis Date Noted   Malignant neoplasm of prostate ([place]) 08/03/2022   Asymptomatic carotid artery stenosis without infarction, left 02/25/2022   Carotid stenosis 02/03/2022   Encounter for orthopedic follow-up care 01/12/2022   Carpal tunnel syndrome 10/27/2021   Pain in right  hand 10/27/2021   Pain of left hand 10/27/2021   Home Medication(s) Prior to Admission medications   Medication Sig Start Date End Date Taking? Authorizing Provider  Cholecalciferol (VITAMIN D3) 125 MCG (5000 UT) capsule Take 5,000 Units by mouth daily.   Yes [provider]  diclofenac Sodium (VOLTAREN) 1 % GEL Apply 4 g topically 4 (four) times daily as needed (pain).   Yes [provider]  Docusate Calcium (STOOL SOFTENER PO) Take 1 capsule by mouth daily as needed (constipation).   Yes [provider]  Ferrous Sulfate (IRON) 90 (18 Fe) MG TABS Take 90 mg by mouth every evening.   Yes [provider]  Magnesium 500 MG CAPS Take 1 capsule by mouth every evening.   Yes [provider]  Oil of Oregano 1500 MG CAPS Take 1 capsule by mouth daily.   Yes [provider]  OVER THE COUNTER MEDICATION Take 3 capsules by mouth daily after lunch. Prostagenix   Yes [provider]  OVER THE COUNTER MEDICATION Take 1 tablet by mouth daily. Circ O2   Yes [provider]  Probiotic Product (PROBIOTIC DAILY) CAPS Take 1 capsule by mouth daily.   Yes [provider]  rosuvastatin (CRESTOR) 20 MG tablet Take 1 tablet (20 mg total) by mouth every evening. Patient taking differently: Take 20 mg by mouth every evening. 02/26/22  Yes Marty Heck, MD  Allergies Sulfa antibiotics  Review of Systems Review of Systems As noted in HPI  Physical Exam Vital Signs  I have reviewed the triage vital signs BP (!) 148/77 (BP Location: Left Arm)   Pulse 72   Temp 98.1 F (36.7 C) (Oral)   Resp 18   SpO2 97%   Physical Exam Vitals reviewed. Exam conducted with a chaperone present (daughter).  Constitutional:      General: He is not in acute distress.    Appearance: He is well-developed. He is not  diaphoretic.  HENT:     Head: Normocephalic and atraumatic.     Right Ear: External ear normal.     Left Ear: External ear normal.     Nose: Nose normal.     Mouth/Throat:     Mouth: Mucous membranes are moist.  Eyes:     General: No scleral icterus.    Conjunctiva/sclera: Conjunctivae normal.  Neck:     Trachea: Phonation normal.  Cardiovascular:     Rate and Rhythm: Normal rate and regular rhythm.  Pulmonary:     Effort: Pulmonary effort is normal. No respiratory distress.     Breath sounds: No stridor.  Abdominal:     General: There is no distension.     Tenderness: There is abdominal tenderness (mild) in the suprapubic area. There is no guarding or rebound. Negative signs include Murphy's sign.  Genitourinary:    Rectum: External hemorrhoid present. No anal fissure.     Comments: Bright red blood slowly oozing out Musculoskeletal:        General: Normal range of motion.     Cervical back: Normal range of motion.  Neurological:     Mental Status: He is alert and oriented to person, place, and time.  Psychiatric:        Behavior: Behavior normal.     ED Results and Treatments Labs (all labs ordered are listed, but only abnormal results are displayed) Labs Reviewed  COMPREHENSIVE METABOLIC PANEL - Abnormal; Notable for the following components:      Result Value   Sodium 129 (*)    CO2 20 (*)    Glucose, Bld 132 (*)    Calcium 8.4 (*)    All other components within normal limits  CBC - Abnormal; Notable for the following components:   WBC 18.3 (*)    Hemoglobin 12.4 (*)    HCT 37.0 (*)    All other components within normal limits  CULTURE, BLOOD (ROUTINE X 2)  CULTURE, BLOOD (ROUTINE X 2)  URINALYSIS, ROUTINE W REFLEX MICROSCOPIC  LACTIC ACID, PLASMA  LACTIC ACID, PLASMA  TYPE AND SCREEN                                                                                                                         EKG  EKG Interpretation  Date/Time:    Ventricular  Rate:    PR Interval:    QRS Duration:   QT Interval:  QTC Calculation:   R Axis:     Text Interpretation:         Radiology CT ABDOMEN PELVIS W CONTRAST  Result Date: 08/23/2022 CLINICAL DATA:  Lower GI bleed.  Abdominal pain and constipation. EXAM: CT ABDOMEN AND PELVIS WITH CONTRAST TECHNIQUE: Multidetector CT imaging of the abdomen and pelvis was performed using the standard protocol following bolus administration of intravenous contrast. RADIATION DOSE REDUCTION: This exam was performed according to the departmental dose-optimization program which includes automated exposure control, adjustment of the mA and/or kV according to patient size and/or use of iterative reconstruction technique. CONTRAST:  161m OMNIPAQUE IOHEXOL 300 MG/ML  SOLN COMPARISON:  None Available. FINDINGS: Lower chest: Mild atelectasis is present at the lung bases. Hepatobiliary: No focal liver abnormality is seen. No gallstones, gallbladder wall thickening, or biliary dilatation. Pancreas: Unremarkable. No pancreatic ductal dilatation or surrounding inflammatory changes. Spleen: Normal in size without focal abnormality. Adrenals/Urinary Tract: The adrenal glands are within normal limits. The kidneys enhance symmetrically. There is a tiny cyst containing a calcification in the mid right kidney. No hydronephrosis bilaterally. The bladder is unremarkable. Stomach/Bowel: The stomach is within normal limits. No bowel obstruction, free air, or pneumatosis. There is marked thickening of the walls of the rectum with surrounding fat stranding, compatible with proctitis. The appendix is not visualized on exam. Vascular/Lymphatic: Aortic atherosclerosis. No enlarged abdominal or pelvic lymph nodes. Reproductive: Prostate gland is mildly enlarged. Other: No abdominopelvic ascites. Musculoskeletal: Degenerative changes are present in the thoracolumbar spine. No acute or suspicious osseous abnormality. IMPRESSION: 1. March rectal wall  thickening with surrounding fat stranding, compatible with proctitis. 2. Aortic atherosclerosis. Electronically Signed   By: LBrett FairyM.D.   On: 08/23/2022 04:56    Medications Ordered in ED Medications  piperacillin-tazobactam (ZOSYN) IVPB 3.375 g (3.375 g Intravenous New Bag/Given 08/23/22 0744)  sodium chloride 0.9 % bolus 1,000 mL (has no administration in time range)    Followed by  0.9 %  sodium chloride infusion (has no administration in time range)  sodium chloride 0.9 % bolus 1,000 mL (0 mLs Intravenous Stopped 08/23/22 0600)  iohexol (OMNIPAQUE) 300 MG/ML solution 100 mL (100 mLs Intravenous Contrast Given 08/23/22 0425)                                                                                                                                     Procedures Procedures  (including critical care time)  Medical Decision Making / ED Course  Click here for ABCD2, HEART and other calculators  Medical Decision Making Amount and/or Complexity of Data Reviewed Labs: ordered. Decision-making details documented in ED Course. Radiology: ordered and independent interpretation performed. Decision-making details documented in ED Course.  Risk Prescription drug management. Decision regarding hospitalization.    This patient presents to the ED for concern of rectal pain and bleeding, this involves an extensive number of treatment options, and is a complaint that  carries with it a high risk of complications and morbidity. The differential diagnosis includes but not limited to lower GI bleed from hemorrhoids, will need to assess for any evidence of intra-abdominal inflammatory/infectious process.  Will also assess for any complications related to prostate cancer.  Started on IV fluids  CBC with leukocytosis of 18.  No significant anemia, the patient does have a 1-1/2 g drop in hemoglobin in the last 4 months. BMP with mild hyponatremia.  No other significant electrolyte derangements or  renal sufficiency.  No evidence of bili obstruction.  CT scan is notable for evidence of proctitis likely the source of his bleeding.  Code sepsis was initiated and patient started on antibiotics.  Patient's initial tachycardia improved after the initial IV fluids.  Getting blood cultures and lactic acid.  Case discussed with hospitalist who will evaluate patient for admission and further management.       Final Clinical Impression(s) / ED Diagnoses Final diagnoses:  Proctitis  Lower GI bleed           This chart was dictated using voice recognition software.  Despite best efforts to proofread,  errors can occur which can change the documentation meaning.    Fatima Blank, MD 08/23/22 [phone]

## 2022-08-24 ENCOUNTER — Encounter (HOSPITAL_COMMUNITY): Payer: Self-pay | Admitting: Certified Registered Nurse Anesthetist

## 2022-08-24 LAB — CBC WITH DIFFERENTIAL/PLATELET
Abs Immature Granulocytes: 0.04 10*3/uL (ref 0.00–0.07)
Basophils Absolute: 0.1 10*3/uL (ref 0.0–0.1)
Basophils Relative: 1 %
Eosinophils Absolute: 0.4 10*3/uL (ref 0.0–0.5)
Eosinophils Relative: 4 %
HCT: 37.9 % — ABNORMAL LOW (ref 39.0–52.0)
Hemoglobin: 12.1 g/dL — ABNORMAL LOW (ref 13.0–17.0)
Immature Granulocytes: 0 %
Lymphocytes Relative: 14 %
Lymphs Abs: 1.3 10*3/uL (ref 0.7–4.0)
MCH: 27.9 pg (ref 26.0–34.0)
MCHC: 31.9 g/dL (ref 30.0–36.0)
MCV: 87.3 fL (ref 80.0–100.0)
Monocytes Absolute: 0.8 10*3/uL (ref 0.1–1.0)
Monocytes Relative: 8 %
Neutro Abs: 7 10*3/uL (ref 1.7–7.7)
Neutrophils Relative %: 73 %
Platelets: 284 10*3/uL (ref 150–400)
RBC: 4.34 MIL/uL (ref 4.22–5.81)
RDW: 13.7 % (ref 11.5–15.5)
WBC: 9.6 10*3/uL (ref 4.0–10.5)
nRBC: 0 % (ref 0.0–0.2)

## 2022-08-24 LAB — BASIC METABOLIC PANEL
Anion gap: 10 (ref 5–15)
BUN: 14 mg/dL (ref 8–23)
CO2: 23 mmol/L (ref 22–32)
Calcium: 8.8 mg/dL — ABNORMAL LOW (ref 8.9–10.3)
Chloride: 105 mmol/L (ref 98–111)
Creatinine, Ser: 1.26 mg/dL — ABNORMAL HIGH (ref 0.61–1.24)
GFR, Estimated: >60 mL/min (ref >60)
Glucose, Bld: 112 mg/dL — ABNORMAL HIGH (ref 70–99)
Potassium: 4.1 mmol/L (ref 3.5–5.1)
Sodium: 138 mmol/L (ref 135–145)

## 2022-08-24 MED ORDER — MAGNESIUM OXIDE -MG SUPPLEMENT 400 (240 MG) MG PO TABS
400.0000 mg | ORAL_TABLET | Freq: Every evening | ORAL | Status: DC
  Administered 2022-08-24: 400 mg via ORAL
  Filled 2022-08-24: qty 1

## 2022-08-24 MED ORDER — FERROUS SULFATE 325 (65 FE) MG PO TABS
325.0000 mg | ORAL_TABLET | Freq: Every evening | ORAL | Status: DC
  Administered 2022-08-24: 325 mg via ORAL
  Filled 2022-08-24: qty 1

## 2022-08-24 NOTE — Progress Notes (Signed)
Mobility Specialist - Progress Note   08/24/22 0919  Mobility  Activity Ambulated independently in hallway  Level of Assistance Independent  Assistive Device None  Distance Ambulated (ft) 1900 ft  Activity Response Tolerated well  Mobility Referral Yes  $Mobility charge 1 Mobility   Pt received in bed and agreeable to mobility. No complaints during session. Pt to recliner after session with all needs met.    [facility]

## 2022-08-24 NOTE — Assessment & Plan Note (Addendum)
-  Patient presented with acute rectal pain with bleeding -CT with marked rectal wall thickening c/w proctitis -Patient was started on Zosyn and is significantly improved, feels better for dc today -Was admitted as inpatient -Based on pharmacy input, will give PO Augmentin x 7 days

## 2022-08-24 NOTE — Assessment & Plan Note (Signed)
Continue rosuvastatin.  

## 2022-08-24 NOTE — Assessment & Plan Note (Signed)
-  He was scheduled for gold seed implantation tomorrow but this will need to be delayed -Dr. Milford Cage has been notified of admission

## 2022-08-24 NOTE — Consult Note (Signed)
Urology Consult   Physician requesting consult: Hollice Gong  Reason for consult: Prostate cancer, recent diagnosis proctitis  History of Present Illness: Peter Roberts is a 72 y.o. white male with previous diagnosis of adenocarcinoma the prostate.  Ultimately opted for radiation therapy.  Patient has been scheduled for fiduciary marker placement and SpaceOAR gel implant tomorrow.  He was admitted yesterday with bout of severe proctitis and therefore his procedure has been canceled.  Patient has had no prior histories of proctitis.  Again experiencing problems with constipation and some blood per rectum over the last week.  Patient states he has had colonoscopy back in 2019 which was negative.  He denies a history of voiding or storage urinary symptoms, hematuria, UTIs, STDs, urolithiasis, GU malignancy/trauma/surgery.  Past Medical History:  Diagnosis Date   Arthritis    BPH associated with nocturia    Carotid stenosis, asymptomatic, bilateral 2017   followed by pcp and vascular dr Carlis Abbott;    s/p left CEA 02-25-2022 for stenosis >80%; (post op note in epic 03-17-2022 healing well)   last duplex in epic 02-03-2022 right ICA 40-59% and left ICA 80-99%   Elevated blood pressure reading without diagnosis of hypertension    Family history of prostate cancer    History of anal fissures    Hyperlipidemia    Malignant neoplasm prostate ([facility]) 04/2022   urologist--- dr Muskan Bolla/  radiation oncologist--- dr Tammi Klippel;  dx 11/ 2023,  Gleason 4+3   Microcytic anemia     Past Surgical History:  Procedure Laterality Date   CARPAL TUNNEL RELEASE Left 12/2021   COLONOSCOPY  2019   ENDARTERECTOMY Left 02/25/2022   Procedure: LEFT CAROTID ENDARTERECTOMY;  Surgeon: Marty Heck, MD;  Location: [facility];  Service: Vascular;  Laterality: Left;   KNEE ARTHROSCOPY Right 2002    Current Hospital Medications:  Home Meds:  No current facility-administered medications on file prior to encounter.   Current  Outpatient Medications on File Prior to Encounter  Medication Sig Dispense Refill   Cholecalciferol (VITAMIN D3) 125 MCG (5000 UT) capsule Take 5,000 Units by mouth daily.     diclofenac Sodium (VOLTAREN) 1 % GEL Apply 4 g topically 4 (four) times daily as needed (pain).     Docusate Calcium (STOOL SOFTENER PO) Take 1 capsule by mouth daily as needed (constipation).     Ferrous Sulfate (IRON) 90 (18 Fe) MG TABS Take 90 mg by mouth every evening.     Magnesium 500 MG CAPS Take 1 capsule by mouth every evening.     Oil of Oregano 1500 MG CAPS Take 1 capsule by mouth daily.     OVER THE COUNTER MEDICATION Take 3 capsules by mouth daily after lunch. Prostagenix     OVER THE COUNTER MEDICATION Take 1 tablet by mouth daily. Circ O2     Probiotic Product (PROBIOTIC DAILY) CAPS Take 1 capsule by mouth daily.     rosuvastatin (CRESTOR) 20 MG tablet Take 1 tablet (20 mg total) by mouth every evening. (Patient taking differently: Take 20 mg by mouth every evening.) 90 tablet 2     Scheduled Meds:  rosuvastatin  20 mg Oral QPM   Continuous Infusions:  sodium chloride 1,000 mL (08/23/22 0908)   sodium chloride 75 mL/hr at 08/23/22 2103   piperacillin-tazobactam (ZOSYN)  IV 3.375 g (08/24/22 0557)   PRN Meds:.acetaminophen **OR** acetaminophen, metoprolol tartrate, ondansetron **OR** ondansetron (ZOFRAN) IV, oxyCODONE, traZODone  Allergies:  Allergies  Allergen Reactions   Sulfa Antibiotics Hives  Family History  Problem Relation Age of Onset   Lung disease Mother    Heart attack Father    Heart disease Father     Social History:  reports that he has never smoked. He has never been exposed to tobacco smoke. He has never used smokeless tobacco. He reports that he does not drink alcohol and does not use drugs.  ROS: A complete review of systems was performed.  All systems are negative except for pertinent findings as noted.  Physical Exam:  Vital signs in last 24 hours: Temp:  [97.4 F  (36.3 C)-97.9 F (36.6 C)] 97.4 F (36.3 C) (03/04 1037) Pulse Rate:  [67-74] 74 (03/04 1037) Resp:  [14-20] 14 (03/04 1037) BP: (140-170)/(59-75) 154/59 (03/04 1037) SpO2:  [98 %-100 %] 100 % (03/04 1037) Constitutional:  Alert and oriented, No acute distress Lymphatic: No lymphadenopathy Neurologic: Grossly intact, no focal deficits Psychiatric: Normal mood and affect  Laboratory Data:  Recent Labs    08/23/22 0323 08/24/22 0741  WBC 18.3* 9.6  HGB 12.4* 12.1*  HCT 37.0* 37.9*  PLT 292 284    Recent Labs    08/23/22 0323 08/24/22 0741  NA 129* 138  K 4.1 4.1  CL 103 105  GLUCOSE 132* 112*  BUN 22 14  CALCIUM 8.4* 8.8*  CREATININE 0.91 1.26*     Results for orders placed or performed during the hospital encounter of 08/22/22 (from the past 24 hour(s))  CBC with Differential/Platelet     Status: Abnormal   Collection Time: 08/24/22  7:41 AM  Result Value Ref Range   WBC 9.6 4.0 - 10.5 K/uL   RBC 4.34 4.22 - 5.81 MIL/uL   Hemoglobin 12.1 (L) 13.0 - 17.0 g/dL   HCT 37.9 (L) 39.0 - 52.0 %   MCV 87.3 80.0 - 100.0 fL   MCH 27.9 26.0 - 34.0 pg   MCHC 31.9 30.0 - 36.0 g/dL   RDW 13.7 11.5 - 15.5 %   Platelets 284 150 - 400 K/uL   nRBC 0.0 0.0 - 0.2 %   Neutrophils Relative % 73 %   Neutro Abs 7.0 1.7 - 7.7 K/uL   Lymphocytes Relative 14 %   Lymphs Abs 1.3 0.7 - 4.0 K/uL   Monocytes Relative 8 %   Monocytes Absolute 0.8 0.1 - 1.0 K/uL   Eosinophils Relative 4 %   Eosinophils Absolute 0.4 0.0 - 0.5 K/uL   Basophils Relative 1 %   Basophils Absolute 0.1 0.0 - 0.1 K/uL   Immature Granulocytes 0 %   Abs Immature Granulocytes 0.04 0.00 - 0.07 K/uL  Basic metabolic panel     Status: Abnormal   Collection Time: 08/24/22  7:41 AM  Result Value Ref Range   Sodium 138 135 - 145 mmol/L   Potassium 4.1 3.5 - 5.1 mmol/L   Chloride 105 98 - 111 mmol/L   CO2 23 22 - 32 mmol/L   Glucose, Bld 112 (H) 70 - 99 mg/dL   BUN 14 8 - 23 mg/dL   Creatinine, Ser 1.26 (H) 0.61 -  1.24 mg/dL   Calcium 8.8 (L) 8.9 - 10.3 mg/dL   GFR, Estimated >60 >60 mL/min   Anion gap 10 5 - 15   Recent Results (from the past 240 hour(s))  Blood culture (routine x 2)     Status: None (Preliminary result)   Collection Time: 08/23/22  6:40 AM   Specimen: BLOOD  Result Value Ref Range Status   Specimen Description  Final    BLOOD RIGHT ANTECUBITAL Performed at [facility] [address] [place], [place] [address]    Special Requests   Final    BOTTLES DRAWN AEROBIC AND ANAEROBIC Blood Culture results may not be optimal due to an excessive volume of blood received in culture bottles Performed at [facility] [address] [place], [place] [address]    Culture   Final    NO GROWTH < 24 HOURS Performed at [facility] [address], [place], [place] [address]    Report Status PENDING  Incomplete  Blood culture (routine x 2)     Status: None (Preliminary result)   Collection Time: 08/23/22  6:45 AM   Specimen: BLOOD  Result Value Ref Range Status   Specimen Description   Final    BLOOD BLOOD LEFT FOREARM Performed at [facility] [address] [place], [place] [address]    Special Requests   Final    BOTTLES DRAWN AEROBIC AND ANAEROBIC Blood Culture results may not be optimal due to an excessive volume of blood received in culture bottles Performed at [facility] [address], [place], [place] [address]    Culture   Final    NO GROWTH < 24 HOURS Performed at [facility] [address], [place], [place] [address]    Report Status PENDING  Incomplete    Renal Function: Recent Labs    08/23/22 0323 08/24/22 0741  CREATININE 0.91 1.26*   Estimated Creatinine Clearance: 57.3 mL/min (A) (by C-G formula based on SCr of 1.26 mg/dL (H)).  Radiologic Imaging: CT ABDOMEN PELVIS W CONTRAST  Result Date: 08/23/2022 CLINICAL DATA:  Lower GI bleed.  Abdominal pain and  constipation. EXAM: CT ABDOMEN AND PELVIS WITH CONTRAST TECHNIQUE: Multidetector CT imaging of the abdomen and pelvis was performed using the standard protocol following bolus administration of intravenous contrast. RADIATION DOSE REDUCTION: This exam was performed according to the departmental dose-optimization program which includes automated exposure control, adjustment of the mA and/or kV according to patient size and/or use of iterative reconstruction technique. CONTRAST:  128m OMNIPAQUE IOHEXOL 300 MG/ML  SOLN COMPARISON:  None Available. FINDINGS: Lower chest: Mild atelectasis is present at the lung bases. Hepatobiliary: No focal liver abnormality is seen. No gallstones, gallbladder wall thickening, or biliary dilatation. Pancreas: Unremarkable. No pancreatic ductal dilatation or surrounding inflammatory changes. Spleen: Normal in size without focal abnormality. Adrenals/Urinary Tract: The adrenal glands are within normal limits. The kidneys enhance symmetrically. There is a tiny cyst containing a calcification in the mid right kidney. No hydronephrosis bilaterally. The bladder is unremarkable. Stomach/Bowel: The stomach is within normal limits. No bowel obstruction, free air, or pneumatosis. There is marked thickening of the walls of the rectum with surrounding fat stranding, compatible with proctitis. The appendix is not visualized on exam. Vascular/Lymphatic: Aortic atherosclerosis. No enlarged abdominal or pelvic lymph nodes. Reproductive: Prostate gland is mildly enlarged. Other: No abdominopelvic ascites. Musculoskeletal: Degenerative changes are present in the thoracolumbar spine. No acute or suspicious osseous abnormality. IMPRESSION: 1. March rectal wall thickening with surrounding fat stranding, compatible with proctitis. 2. Aortic atherosclerosis. Electronically Signed   By: LBrett FairyM.D.   On: 08/23/2022 04:56    I independently reviewed the above imaging  studies.  Impression/Recommendation 1.  Acute proctitis 2.  Adenocarcinoma the prostate with plan for upcoming IMRT  Plan/recommendation.  Procedure for fiduciary marker placement and SpaceOAR gel implant canceled for  tomorrow.  Patient will need full evaluation with likely at least flex sigmoidoscopy if not colonoscopy to evaluate sure no rectal or colonic lesions before rescheduling for any sort of treatment.  May also consider surgical options for prostate cancer in light of the new proctitis finding.  Will discuss with Dr. Tammi Klippel in [place] 08/24/2022, 12:45 PM    Remi Haggard, MD   CC: Dr. Tyler Pita

## 2022-08-24 NOTE — Assessment & Plan Note (Signed)
-  Related to proctitis -Hgb is relatively stable, recheck in AM

## 2022-08-24 NOTE — Progress Notes (Signed)
Progress Note   Patient: Peter Roberts [id number] DOB: Mar 23, 1951 DOA: 08/22/2022     1 DOS: the patient was seen and examined on 08/24/2022   Brief hospital course: Patient with h/o BPH, carotid stenosis, and recent diagnosis of prostate cancer planning to start radiation therapy who was admitted with rectal pain and BRBPR.  This is thought to be related to proctitis, as seen on CT.  Started on Zosyn.  Assessment and Plan: * Proctitis -Patient presenting with acute rectal pain with bleeding -CT with marked rectal wall thickening c/w proctitis -Patient was started on Zosyn and is already feeling better -Will admit, continue to monitor -May be appropriate for transition to PO antibiotics and dc in a day or two  Hyponatremia -resolved, likely related to dehydration in the setting of acute proctitis  Rectal bleed -Related to proctitis -Hgb is relatively stable, recheck in AM  Prostate cancer ([facility]) -He was scheduled for gold seed implantation tomorrow but this will need to be delayed -Dr. Milford Cage has been notified of admission  Asymptomatic carotid artery stenosis without infarction, left -Continue rosuvastatin    Proctitis -as seen on CT scan as mentioned above, he is nontoxic.  Lactate pending.  Vital signs are stable. -Observation admission -Continue IV fluids -Nausea control as needed -Continue empiric IV Zosyn Active Problems:   Asymptomatic carotid artery stenosis without infarction, left   Prostate cancer ([facility])   Rectal bleed -hemoglobin down from 14-12.4, currently no evidence of active bleeding, will avoid blood thinners and trend   Hyponatremia -mild, and asymptomatic.  Will hydrate, and recheck sodium level in the morning   Subjective: He reports that he is feeling some better today.  Over the weekend, he was barely able to walk due to the rectal pain.  He hasn't had a regular BM in a week and felt very constipated over the weekend, although this is less  uncomfortable currently.  He is having small liquid BMs with some blood, but not bleeding significantly.  He is still having positional pain.  He is scheduled for gold seed implants tomorrow with urology but likely isn't able to have that done.   Physical Exam: Vitals:   08/24/22 1037 08/24/22 1339 08/24/22 1341 08/24/22 1717  BP: (!) 154/59 (!) 186/69 (!) 180/69 (!) 174/72  Pulse: 74 67 68 65  Resp: '14 14 14 14  '$ Temp: (!) 97.4 F (36.3 C) 97.7 F (36.5 C)  97.8 F (36.6 C)  TempSrc: Oral Oral  Oral  SpO2: 100% 100% 100% 100%  Weight:      Height:       General:  Appears calm and comfortable and is in NAD Eyes:  EOMI, normal lids, iris ENT:  grossly normal hearing, lips & tongue, mmm Neck:  no LAD, masses or thyromegaly Cardiovascular:  RRR, no m/r/g. No LE edema.  Respiratory:   CTA bilaterally with no wheezes/rales/rhonchi.  Normal respiratory effort. Abdomen:  soft, mild lower abdominal TTP, ND Skin:  no rash or induration seen on limited exam Musculoskeletal:  grossly normal tone BUE/BLE, good ROM, no bony abnormality Psychiatric:  grossly normal mood and affect, speech fluent and appropriate, AOx3 Neurologic:  CN 2-12 grossly intact, moves all extremities in coordinated fashion   Radiological Exams on Admission: Independently reviewed - see discussion in A/P where applicable  CT ABDOMEN PELVIS W CONTRAST  Result Date: 08/23/2022 CLINICAL DATA:  Lower GI bleed.  Abdominal pain and constipation. EXAM: CT ABDOMEN AND PELVIS WITH CONTRAST TECHNIQUE: Multidetector CT imaging of the  abdomen and pelvis was performed using the standard protocol following bolus administration of intravenous contrast. RADIATION DOSE REDUCTION: This exam was performed according to the departmental dose-optimization program which includes automated exposure control, adjustment of the mA and/or kV according to patient size and/or use of iterative reconstruction technique. CONTRAST:  144m OMNIPAQUE IOHEXOL  300 MG/ML  SOLN COMPARISON:  None Available. FINDINGS: Lower chest: Mild atelectasis is present at the lung bases. Hepatobiliary: No focal liver abnormality is seen. No gallstones, gallbladder wall thickening, or biliary dilatation. Pancreas: Unremarkable. No pancreatic ductal dilatation or surrounding inflammatory changes. Spleen: Normal in size without focal abnormality. Adrenals/Urinary Tract: The adrenal glands are within normal limits. The kidneys enhance symmetrically. There is a tiny cyst containing a calcification in the mid right kidney. No hydronephrosis bilaterally. The bladder is unremarkable. Stomach/Bowel: The stomach is within normal limits. No bowel obstruction, free air, or pneumatosis. There is marked thickening of the walls of the rectum with surrounding fat stranding, compatible with proctitis. The appendix is not visualized on exam. Vascular/Lymphatic: Aortic atherosclerosis. No enlarged abdominal or pelvic lymph nodes. Reproductive: Prostate gland is mildly enlarged. Other: No abdominopelvic ascites. Musculoskeletal: Degenerative changes are present in the thoracolumbar spine. No acute or suspicious osseous abnormality. IMPRESSION: 1. March rectal wall thickening with surrounding fat stranding, compatible with proctitis. 2. Aortic atherosclerosis. Electronically Signed   By: LBrett FairyM.D.   On: 08/23/2022 04:56    EKG: not done   Labs on Admission: I have personally reviewed the available labs and imaging studies at the time of the admission.  Pertinent labs:    Na++ 129 -> 138 Glucose 132 -> 105 Lactate 0.7 WBC 18.3 -> 9.6 Hgb 12.4 -> 12.1 Blood cultures x 2 pending  Family Communication: None present; he is capable of communicating with family at this time  Disposition: Status is: Inpatient Remains inpatient appropriate because: IV antibiotics  Planned Discharge Destination: Home    Time spent: 50 minutes  Author: JKarmen Bongo MD 08/24/2022 5:29 PM  For on  call review [email or web address]

## 2022-08-24 NOTE — Assessment & Plan Note (Signed)
-  resolved, likely related to dehydration in the setting of acute proctitis

## 2022-08-24 NOTE — Hospital Course (Addendum)
Patient with h/o BPH, carotid stenosis, and recent diagnosis of prostate cancer planning to start radiation therapy who was admitted with rectal pain and BRBPR.  This is thought to be related to proctitis, as seen on CT.  Started on Zosyn.

## 2022-08-24 NOTE — TOC CM/SW Note (Signed)
Transition of Care [facility]) Screening Note  Patient Details  Name: Tziah Bisaillon Date of Birth: 06/11/1951  [facility]) CM/SW Contact:    Sherie Don, LCSW Phone Number: 08/24/2022, 9:48 AM  Transition of Care Department [facility]) has reviewed patient and no TOC needs have been identified at this time. We will continue to monitor patient advancement through interdisciplinary progression rounds. If new patient transition needs arise, please place a TOC consult.

## 2022-08-24 NOTE — Progress Notes (Signed)
Mobility Specialist - Progress Note   08/24/22 1411  Mobility  Activity Ambulated independently in hallway  Level of Assistance Independent  Assistive Device None  Distance Ambulated (ft) 2130 ft  Activity Response Tolerated well  Mobility Referral Yes  $Mobility charge 1 Mobility   Pt received in bed and agreeable to mobility. No complaints during session. Pt to room standing after session with all needs met.    [facility]

## 2022-08-25 ENCOUNTER — Ambulatory Visit (HOSPITAL_BASED_OUTPATIENT_CLINIC_OR_DEPARTMENT_OTHER): Admission: RE | Admit: 2022-08-25 | Payer: Medicare Other | Source: Home / Self Care | Admitting: Urology

## 2022-08-25 DIAGNOSIS — Z01818 Encounter for other preprocedural examination: Secondary | ICD-10-CM

## 2022-08-25 DIAGNOSIS — N1831 Chronic kidney disease, stage 3a: Secondary | ICD-10-CM | POA: Diagnosis present

## 2022-08-25 DIAGNOSIS — I10 Essential (primary) hypertension: Secondary | ICD-10-CM | POA: Diagnosis present

## 2022-08-25 HISTORY — DX: Nocturia: N40.1

## 2022-08-25 HISTORY — DX: Unspecified osteoarthritis, unspecified site: M19.90

## 2022-08-25 HISTORY — DX: Elevated blood-pressure reading, without diagnosis of hypertension: R03.0

## 2022-08-25 HISTORY — DX: Iron deficiency anemia, unspecified: D50.9

## 2022-08-25 HISTORY — DX: Personal history of other diseases of the digestive system: Z87.19

## 2022-08-25 HISTORY — DX: Hyperlipidemia, unspecified: E78.5

## 2022-08-25 LAB — CBC WITH DIFFERENTIAL/PLATELET
Abs Immature Granulocytes: 0.05 10*3/uL (ref 0.00–0.07)
Basophils Absolute: 0.1 10*3/uL (ref 0.0–0.1)
Basophils Relative: 1 %
Eosinophils Absolute: 0.6 10*3/uL — ABNORMAL HIGH (ref 0.0–0.5)
Eosinophils Relative: 6 %
HCT: 38.6 % — ABNORMAL LOW (ref 39.0–52.0)
Hemoglobin: 12.4 g/dL — ABNORMAL LOW (ref 13.0–17.0)
Immature Granulocytes: 1 %
Lymphocytes Relative: 18 %
Lymphs Abs: 1.9 10*3/uL (ref 0.7–4.0)
MCH: 28.2 pg (ref 26.0–34.0)
MCHC: 32.1 g/dL (ref 30.0–36.0)
MCV: 87.9 fL (ref 80.0–100.0)
Monocytes Absolute: 0.9 10*3/uL (ref 0.1–1.0)
Monocytes Relative: 8 %
Neutro Abs: 7.1 10*3/uL (ref 1.7–7.7)
Neutrophils Relative %: 66 %
Platelets: 312 10*3/uL (ref 150–400)
RBC: 4.39 MIL/uL (ref 4.22–5.81)
RDW: 13.5 % (ref 11.5–15.5)
WBC: 10.7 10*3/uL — ABNORMAL HIGH (ref 4.0–10.5)
nRBC: 0 % (ref 0.0–0.2)

## 2022-08-25 LAB — BASIC METABOLIC PANEL
Anion gap: 7 (ref 5–15)
BUN: 18 mg/dL (ref 8–23)
CO2: 23 mmol/L (ref 22–32)
Calcium: 8.6 mg/dL — ABNORMAL LOW (ref 8.9–10.3)
Chloride: 105 mmol/L (ref 98–111)
Creatinine, Ser: 1.3 mg/dL — ABNORMAL HIGH (ref 0.61–1.24)
GFR, Estimated: 59 mL/min — ABNORMAL LOW (ref >60)
Glucose, Bld: 106 mg/dL — ABNORMAL HIGH (ref 70–99)
Potassium: 4.4 mmol/L (ref 3.5–5.1)
Sodium: 135 mmol/L (ref 135–145)

## 2022-08-25 SURGERY — INSERTION, GOLD SEEDS
Anesthesia: Monitor Anesthesia Care

## 2022-08-25 MED ORDER — CEFTRIAXONE SODIUM 1 G IJ SOLR: 1.0000 g | Freq: Once | INTRAMUSCULAR | Status: DC

## 2022-08-25 MED ORDER — AMLODIPINE BESYLATE 5 MG PO TABS: 5.0000 mg | ORAL_TABLET | Freq: Every day | ORAL | 11 refills | Status: DC

## 2022-08-25 MED ORDER — AMOXICILLIN-POT CLAVULANATE 500-125 MG PO TABS: 1.0000 | ORAL_TABLET | Freq: Three times a day (TID) | ORAL | 0 refills | Status: AC

## 2022-08-25 MED ORDER — DOXYCYCLINE HYCLATE 50 MG PO CAPS: 100.0000 mg | ORAL_CAPSULE | Freq: Two times a day (BID) | ORAL | 0 refills | Status: DC

## 2022-08-25 NOTE — Progress Notes (Signed)
Pt currently admitted in hospital and had consult with Dr. Milford Cage.   Upcoming appointments cancelled for radiation due to possible change in treatment plan.    Pt will continue to follow up with Dr. Milford Cage to be evaluated for next steps.

## 2022-08-25 NOTE — Assessment & Plan Note (Signed)
-  Patient reports that he monitors his BP at home and manages it with diet -His BP has been persistently elevated in the hospital -Will start Norvasc 5 mg PO daily -Will need PCP follow up

## 2022-08-25 NOTE — Assessment & Plan Note (Signed)
-  Stable during hospitalization -Needs outpatient monitoring

## 2022-08-25 NOTE — Discharge Summary (Addendum)
Physician Discharge Summary   Patient: Peter Roberts MRN: [id number] DOB: 12/19/1950  Admit date:     08/22/2022  Discharge date: 08/25/22  Discharge Physician: Karmen Bongo   PCP: Maury Dus, MD (Inactive)   Recommendations at discharge:   Complete antibiotics Your blood pressure was elevated during your hospitalization and you are being started on a medication for it called amlodipine; take one pill daily and discuss with your PCP at the time of follow up Follow up with PCP in 1-2 weeks Follow up with Dr. Michail Sermon from [place] for possible flexible sigmoidoscopy once proctitis is resolved Follow up with urology as recommended Follow up with radiation oncology as recommended  Discharge Diagnoses: Principal Problem:   Proctitis Active Problems:   Asymptomatic carotid artery stenosis without infarction, left   Prostate cancer ([place])   Rectal bleed   Hyponatremia   Chronic kidney disease, stage 3a ([place])   Essential hypertension   Hospital Course: Patient with h/o BPH, carotid stenosis, and recent diagnosis of prostate cancer planning to start radiation therapy who was admitted with rectal pain and BRBPR.  This is thought to be related to proctitis, as seen on CT.  Started on Zosyn.  Assessment and Plan: * Proctitis -Patient presented with acute rectal pain with bleeding -CT with marked rectal wall thickening c/w proctitis -Patient was started on Zosyn and is significantly improved, feels better for dc today -Was admitted as inpatient -Based on pharmacy input, will give PO Augmentin x 7 days  Essential hypertension -Patient reports that he monitors his BP at home and manages it with diet -His BP has been persistently elevated in the hospital -Will start Norvasc 5 mg PO daily -Will need PCP follow up  Chronic kidney disease, stage 3a ([place]) -Stable during hospitalization -Needs outpatient monitoring  Hyponatremia -resolved, likely related to dehydration in the setting  of acute proctitis  Rectal bleed -Related to proctitis -Hgb is relatively stable, recheck as an outpatient -Needs GI f/u, referral placed  Prostate cancer ([place]) -He was scheduled for gold seed implantation tomorrow but this is delayed and may need to be canceled -Dr. Milford Cage has consulted -Based on proctitis, he will need GI follow up for flex sig or colonoscopy prior to initiation of treatment for prostate cancer -He was previously scheduled for radiation therapy but is now inclined to proceed with prostatectomy -Will need outpatient urology f/u -He may also need outpatient rad onc follow up  Asymptomatic carotid artery stenosis without infarction, left -Continue rosuvastatin      Pain control - [facility] Controlled Substance Reporting System database was reviewed. and patient was instructed, not to drive, operate heavy machinery, perform activities at heights, swimming or participation in water activities or provide baby-sitting services while on Pain, Sleep and Anxiety Medications; until their outpatient Physician has advised to do so again. Also recommended to not to take more than prescribed Pain, Sleep and Anxiety Medications.  Consultants: Urology, [facility] team Procedures performed: None  Disposition: Home Diet recommendation:  Regular diet DISCHARGE MEDICATION: Allergies as of 08/25/2022       Reactions   Sulfa Antibiotics Hives        Medication List     TAKE these medications    amLODipine 5 MG tablet Commonly known as: NORVASC Take 1 tablet (5 mg total) by mouth daily.   amoxicillin-clavulanate 500-125 MG tablet Commonly known as: Augmentin Take 1 tablet by mouth 3 (three) times daily for 7 days.   Iron 90 (18 Fe) MG Tabs Take  90 mg by mouth every evening.   Magnesium 500 MG Caps Take 1 capsule by mouth every evening.   Oil of Oregano 1500 MG Caps Take 1 capsule by mouth daily.   OVER THE COUNTER MEDICATION Take 3 capsules by mouth daily after  lunch. Prostagenix   OVER THE COUNTER MEDICATION Take 1 tablet by mouth daily. Circ O2   Probiotic Daily Caps Take 1 capsule by mouth daily.   rosuvastatin 20 MG tablet Commonly known as: CRESTOR Take 1 tablet (20 mg total) by mouth every evening.   STOOL SOFTENER PO Take 1 capsule by mouth daily as needed (constipation).   Vitamin D3 125 MCG (5000 UT) capsule Generic drug: Cholecalciferol Take 5,000 Units by mouth daily.   Voltaren 1 % Gel Generic drug: diclofenac Sodium Apply 4 g topically 4 (four) times daily as needed (pain).        Discharge Exam: Filed Weights   08/23/22 0900  Weight: 83.8 kg   Physical Exam:       Vitals:    08/24/22 1341 08/24/22 1717 08/24/22 2046 08/25/22 0631  BP: (!) 180/69 (!) 174/72 (!) 149/64 (!) 164/68  Pulse: 68 65 65 65  Resp: '14 14 16 14  '$ Temp:   97.8 F (36.6 C) (!) 97.3 F (36.3 C) 97.6 F (36.4 C)  TempSrc:   Oral Axillary Oral  SpO2: 100% 100% 99% 100%  Weight:          Height:            General:  Appears calm and comfortable and is in NAD Eyes:  EOMI, normal lids, iris ENT:  grossly normal hearing, lips & tongue, mmm Neck:  no LAD, masses or thyromegaly Cardiovascular:  RRR, no m/r/g. No LE edema.  Respiratory:   CTA bilaterally with no wheezes/rales/rhonchi.  Normal respiratory effort. Abdomen:  soft, NT, ND Skin:  no rash or induration seen on limited exam Musculoskeletal:  grossly normal tone BUE/BLE, good ROM, no bony abnormality Psychiatric:  grossly normal mood and affect, speech fluent and appropriate, AOx3 Neurologic:  CN 2-12 grossly intact, moves all extremities in coordinated fashion     EKG: none today     Labs on Admission: I have personally reviewed the available labs and imaging studies at the time of the admission.   Pertinent labs:     Glucose 106 BUN 18/Creatinine 1.30/GFR 59 - stable WBC 10.7  Condition at discharge: improving  The results of significant diagnostics from this  hospitalization (including imaging, microbiology, ancillary and laboratory) are listed below for reference.   Imaging Studies: CT ABDOMEN PELVIS W CONTRAST  Result Date: 08/23/2022 CLINICAL DATA:  Lower GI bleed.  Abdominal pain and constipation. EXAM: CT ABDOMEN AND PELVIS WITH CONTRAST TECHNIQUE: Multidetector CT imaging of the abdomen and pelvis was performed using the standard protocol following bolus administration of intravenous contrast. RADIATION DOSE REDUCTION: This exam was performed according to the departmental dose-optimization program which includes automated exposure control, adjustment of the mA and/or kV according to patient size and/or use of iterative reconstruction technique. CONTRAST:  140m OMNIPAQUE IOHEXOL 300 MG/ML  SOLN COMPARISON:  None Available. FINDINGS: Lower chest: Mild atelectasis is present at the lung bases. Hepatobiliary: No focal liver abnormality is seen. No gallstones, gallbladder wall thickening, or biliary dilatation. Pancreas: Unremarkable. No pancreatic ductal dilatation or surrounding inflammatory changes. Spleen: Normal in size without focal abnormality. Adrenals/Urinary Tract: The adrenal glands are within normal limits. The kidneys enhance symmetrically. There is a tiny cyst  containing a calcification in the mid right kidney. No hydronephrosis bilaterally. The bladder is unremarkable. Stomach/Bowel: The stomach is within normal limits. No bowel obstruction, free air, or pneumatosis. There is marked thickening of the walls of the rectum with surrounding fat stranding, compatible with proctitis. The appendix is not visualized on exam. Vascular/Lymphatic: Aortic atherosclerosis. No enlarged abdominal or pelvic lymph nodes. Reproductive: Prostate gland is mildly enlarged. Other: No abdominopelvic ascites. Musculoskeletal: Degenerative changes are present in the thoracolumbar spine. No acute or suspicious osseous abnormality. IMPRESSION: 1. March rectal wall thickening  with surrounding fat stranding, compatible with proctitis. 2. Aortic atherosclerosis. Electronically Signed   By: Brett Fairy M.D.   On: 08/23/2022 04:56    Microbiology: Results for orders placed or performed during the hospital encounter of 08/22/22  Blood culture (routine x 2)     Status: None (Preliminary result)   Collection Time: 08/23/22  6:40 AM   Specimen: BLOOD  Result Value Ref Range Status   Specimen Description   Final    BLOOD RIGHT ANTECUBITAL Performed at [facility] [address] [place], [place] [address]    Special Requests   Final    BOTTLES DRAWN AEROBIC AND ANAEROBIC Blood Culture results may not be optimal due to an excessive volume of blood received in culture bottles Performed at [facility] [address], [place], [place] [address]    Culture   Final    NO GROWTH 2 DAYS Performed at [facility] [address], [place], [place] [address]    Report Status PENDING  Incomplete  Blood culture (routine x 2)     Status: None (Preliminary result)   Collection Time: 08/23/22  6:45 AM   Specimen: BLOOD  Result Value Ref Range Status   Specimen Description   Final    BLOOD BLOOD LEFT FOREARM Performed at [facility] [address], [place], [place] [address]    Special Requests   Final    BOTTLES DRAWN AEROBIC AND ANAEROBIC Blood Culture results may not be optimal due to an excessive volume of blood received in culture bottles Performed at [facility] [address] [place], [place] [address]    Culture   Final    NO GROWTH 2 DAYS Performed at [facility] [address], [place], [place] [address]    Report Status PENDING  Incomplete      Discharge time spent: greater than 30 minutes.  Signed: Karmen Bongo, MD [facility] 08/25/2022

## 2022-08-25 NOTE — Progress Notes (Signed)
Mobility Specialist - Progress Note   08/25/22 0937  Mobility  Activity Ambulated independently in hallway  Level of Assistance Independent  Assistive Device None  Distance Ambulated (ft) 1900 ft  Activity Response Tolerated well  Mobility Referral Yes  $Mobility charge 1 Mobility   Pt received in bed and agreeable to mobility. No complaints during session. Pt to room standing after session with all needs met.    [facility]

## 2022-08-28 ENCOUNTER — Ambulatory Visit: Payer: Medicare Other | Admitting: Radiation Oncology

## 2022-08-28 LAB — CULTURE, BLOOD (ROUTINE X 2)
Culture: NO GROWTH
Culture: NO GROWTH

## 2022-09-03 ENCOUNTER — Ambulatory Visit: Payer: Medicare Other | Admitting: Radiation Oncology

## 2022-09-10 NOTE — Progress Notes (Signed)
RN spoke with Pearson Grippe at [facility] to follow up with referral on patient's behalf.   Pearson Grippe did not receive referral from hospitalist.  RN faxed new referral and Pearson Grippe confirmed she did receive.   Referral will be reviewed and they will contact patient to schedule.   RN spoke with patient and updated him on status.  Verbalized understanding.  Will continue to follow to ensure GI Consult is scheduled, and follow up with MD's to ensure treatment is resumed.

## 2022-09-14 NOTE — Progress Notes (Addendum)
RN left message with referral specialist at [facility] to follow up to ensure referral was received and make appointment.  Referral specialist did return call and confirmed patient is scheduled for 4/15 @ 12:30pm with Dr. Michail Sermon.    RN will update MD's and coordinate for follow up with urology after GI consult per recommendations.   Patient notified.

## 2022-10-06 NOTE — Progress Notes (Signed)
RN left voicemail with medical records @ [facility] to follow with recent consult on 4/15.

## 2022-10-16 NOTE — Progress Notes (Signed)
I called pt to introduce myself as the Prostate Nurse Navigator and the Coordinator of the Prostate MDC.   1. I confirmed with the patient he is aware of his referral to the clinic on 5/28, arriving @ 12:45 pm.    2. I discussed the format of the clinic and the physicians he will be seeing that day.   3. I discussed where the clinic is located and how to contact me.   4. I confirmed his address and informed him I would be mailing a packet of information and forms to be completed. I asked him to bring them with him the day of his appointment.    He voiced understanding of the above. I asked him to call me if he has any questions or concerns regarding his appointments or the forms he needs to complete.

## 2022-10-19 ENCOUNTER — Emergency Department (HOSPITAL_COMMUNITY): Payer: Medicare Other

## 2022-10-19 ENCOUNTER — Other Ambulatory Visit: Payer: Self-pay

## 2022-10-19 ENCOUNTER — Inpatient Hospital Stay (HOSPITAL_COMMUNITY): Payer: Medicare Other

## 2022-10-19 ENCOUNTER — Encounter (HOSPITAL_COMMUNITY): Payer: Self-pay

## 2022-10-19 ENCOUNTER — Inpatient Hospital Stay (HOSPITAL_COMMUNITY)
Admission: EM | Admit: 2022-10-19 | Discharge: 2022-10-21 | DRG: 309 | Disposition: A | Discharge status: Home / Self Care | Payer: Medicare Other | Attending: Family Medicine | Admitting: Family Medicine

## 2022-10-19 DIAGNOSIS — R4701 Aphasia: Secondary | ICD-10-CM | POA: Diagnosis present

## 2022-10-19 DIAGNOSIS — Z8546 Personal history of malignant neoplasm of prostate: Secondary | ICD-10-CM | POA: Diagnosis not present

## 2022-10-19 DIAGNOSIS — R000 Tachycardia, unspecified: Secondary | ICD-10-CM | POA: Diagnosis present

## 2022-10-19 DIAGNOSIS — Z79899 Other long term (current) drug therapy: Secondary | ICD-10-CM

## 2022-10-19 DIAGNOSIS — I480 Paroxysmal atrial fibrillation: Secondary | ICD-10-CM | POA: Diagnosis present

## 2022-10-19 DIAGNOSIS — R471 Dysarthria and anarthria: Secondary | ICD-10-CM | POA: Diagnosis present

## 2022-10-19 DIAGNOSIS — K6289 Other specified diseases of anus and rectum: Secondary | ICD-10-CM | POA: Diagnosis present

## 2022-10-19 DIAGNOSIS — I6523 Occlusion and stenosis of bilateral carotid arteries: Secondary | ICD-10-CM | POA: Diagnosis not present

## 2022-10-19 DIAGNOSIS — D72829 Elevated white blood cell count, unspecified: Secondary | ICD-10-CM | POA: Diagnosis present

## 2022-10-19 DIAGNOSIS — Z8042 Family history of malignant neoplasm of prostate: Secondary | ICD-10-CM

## 2022-10-19 DIAGNOSIS — N401 Enlarged prostate with lower urinary tract symptoms: Secondary | ICD-10-CM | POA: Diagnosis present

## 2022-10-19 DIAGNOSIS — I639 Cerebral infarction, unspecified: Secondary | ICD-10-CM

## 2022-10-19 DIAGNOSIS — K627 Radiation proctitis: Secondary | ICD-10-CM | POA: Diagnosis present

## 2022-10-19 DIAGNOSIS — I2489 Other forms of acute ischemic heart disease: Secondary | ICD-10-CM | POA: Diagnosis present

## 2022-10-19 DIAGNOSIS — I63421 Cerebral infarction due to embolism of right anterior cerebral artery: Secondary | ICD-10-CM

## 2022-10-19 DIAGNOSIS — D509 Iron deficiency anemia, unspecified: Secondary | ICD-10-CM | POA: Diagnosis present

## 2022-10-19 DIAGNOSIS — E1122 Type 2 diabetes mellitus with diabetic chronic kidney disease: Secondary | ICD-10-CM | POA: Diagnosis present

## 2022-10-19 DIAGNOSIS — R351 Nocturia: Secondary | ICD-10-CM | POA: Diagnosis present

## 2022-10-19 DIAGNOSIS — R001 Bradycardia, unspecified: Secondary | ICD-10-CM | POA: Diagnosis present

## 2022-10-19 DIAGNOSIS — M1990 Unspecified osteoarthritis, unspecified site: Secondary | ICD-10-CM | POA: Diagnosis present

## 2022-10-19 DIAGNOSIS — G459 Transient cerebral ischemic attack, unspecified: Secondary | ICD-10-CM

## 2022-10-19 DIAGNOSIS — I10 Essential (primary) hypertension: Secondary | ICD-10-CM

## 2022-10-19 DIAGNOSIS — I2510 Atherosclerotic heart disease of native coronary artery without angina pectoris: Secondary | ICD-10-CM | POA: Diagnosis not present

## 2022-10-19 DIAGNOSIS — Z882 Allergy status to sulfonamides status: Secondary | ICD-10-CM

## 2022-10-19 DIAGNOSIS — R42 Dizziness and giddiness: Secondary | ICD-10-CM | POA: Diagnosis present

## 2022-10-19 DIAGNOSIS — Z8673 Personal history of transient ischemic attack (TIA), and cerebral infarction without residual deficits: Secondary | ICD-10-CM | POA: Diagnosis not present

## 2022-10-19 DIAGNOSIS — E871 Hypo-osmolality and hyponatremia: Secondary | ICD-10-CM | POA: Diagnosis present

## 2022-10-19 DIAGNOSIS — Z8249 Family history of ischemic heart disease and other diseases of the circulatory system: Secondary | ICD-10-CM

## 2022-10-19 DIAGNOSIS — D649 Anemia, unspecified: Secondary | ICD-10-CM | POA: Diagnosis not present

## 2022-10-19 DIAGNOSIS — Y842 Radiological procedure and radiotherapy as the cause of abnormal reaction of the patient, or of later complication, without mention of misadventure at the time of the procedure: Secondary | ICD-10-CM | POA: Diagnosis present

## 2022-10-19 DIAGNOSIS — I6389 Other cerebral infarction: Secondary | ICD-10-CM | POA: Diagnosis not present

## 2022-10-19 DIAGNOSIS — R948 Abnormal results of function studies of other organs and systems: Secondary | ICD-10-CM | POA: Diagnosis present

## 2022-10-19 DIAGNOSIS — R931 Abnormal findings on diagnostic imaging of heart and coronary circulation: Secondary | ICD-10-CM | POA: Diagnosis not present

## 2022-10-19 DIAGNOSIS — I4891 Unspecified atrial fibrillation: Secondary | ICD-10-CM | POA: Diagnosis not present

## 2022-10-19 DIAGNOSIS — I129 Hypertensive chronic kidney disease with stage 1 through stage 4 chronic kidney disease, or unspecified chronic kidney disease: Secondary | ICD-10-CM | POA: Diagnosis present

## 2022-10-19 DIAGNOSIS — R29701 NIHSS score 1: Secondary | ICD-10-CM | POA: Diagnosis present

## 2022-10-19 DIAGNOSIS — N1831 Chronic kidney disease, stage 3a: Secondary | ICD-10-CM | POA: Diagnosis present

## 2022-10-19 DIAGNOSIS — I739 Peripheral vascular disease, unspecified: Secondary | ICD-10-CM | POA: Diagnosis not present

## 2022-10-19 DIAGNOSIS — I25118 Atherosclerotic heart disease of native coronary artery with other forms of angina pectoris: Secondary | ICD-10-CM | POA: Diagnosis present

## 2022-10-19 DIAGNOSIS — K921 Melena: Secondary | ICD-10-CM | POA: Diagnosis present

## 2022-10-19 DIAGNOSIS — R079 Chest pain, unspecified: Secondary | ICD-10-CM

## 2022-10-19 DIAGNOSIS — R29810 Facial weakness: Secondary | ICD-10-CM | POA: Diagnosis present

## 2022-10-19 DIAGNOSIS — E785 Hyperlipidemia, unspecified: Secondary | ICD-10-CM | POA: Diagnosis present

## 2022-10-19 DIAGNOSIS — I6522 Occlusion and stenosis of left carotid artery: Secondary | ICD-10-CM | POA: Diagnosis present

## 2022-10-19 LAB — CBC
HCT: 50.3 % (ref 39.0–52.0)
Hemoglobin: 16.3 g/dL (ref 13.0–17.0)
MCH: 28.2 pg (ref 26.0–34.0)
MCHC: 32.4 g/dL (ref 30.0–36.0)
MCV: 87 fL (ref 80.0–100.0)
Platelets: 268 10*3/uL (ref 150–400)
RBC: 5.78 MIL/uL (ref 4.22–5.81)
RDW: 14.3 % (ref 11.5–15.5)
WBC: 12.5 10*3/uL — ABNORMAL HIGH (ref 4.0–10.5)
nRBC: 0 % (ref 0.0–0.2)

## 2022-10-19 LAB — I-STAT CHEM 8, ED
BUN: 26 mg/dL — ABNORMAL HIGH (ref 8–23)
Calcium, Ion: 1.15 mmol/L (ref 1.15–1.40)
Chloride: 104 mmol/L (ref 98–111)
Creatinine, Ser: 1.4 mg/dL — ABNORMAL HIGH (ref 0.61–1.24)
Glucose, Bld: 112 mg/dL — ABNORMAL HIGH (ref 70–99)
HCT: 52 % (ref 39.0–52.0)
Hemoglobin: 17.7 g/dL — ABNORMAL HIGH (ref 13.0–17.0)
Potassium: 4.2 mmol/L (ref 3.5–5.1)
Sodium: 138 mmol/L (ref 135–145)
TCO2: 22 mmol/L (ref 22–32)

## 2022-10-19 LAB — DIFFERENTIAL
Abs Immature Granulocytes: 0.03 10*3/uL (ref 0.00–0.07)
Basophils Absolute: 0.1 10*3/uL (ref 0.0–0.1)
Basophils Relative: 1 %
Eosinophils Absolute: 0.1 10*3/uL (ref 0.0–0.5)
Eosinophils Relative: 1 %
Immature Granulocytes: 0 %
Lymphocytes Relative: 12 %
Lymphs Abs: 1.5 10*3/uL (ref 0.7–4.0)
Monocytes Absolute: 0.7 10*3/uL (ref 0.1–1.0)
Monocytes Relative: 6 %
Neutro Abs: 10.1 10*3/uL — ABNORMAL HIGH (ref 1.7–7.7)
Neutrophils Relative %: 80 %

## 2022-10-19 LAB — COMPREHENSIVE METABOLIC PANEL
ALT: 37 U/L (ref 0–44)
AST: 37 U/L (ref 15–41)
Albumin: 4.3 g/dL (ref 3.5–5.0)
Alkaline Phosphatase: 78 U/L (ref 38–126)
Anion gap: 12 (ref 5–15)
BUN: 24 mg/dL — ABNORMAL HIGH (ref 8–23)
CO2: 21 mmol/L — ABNORMAL LOW (ref 22–32)
Calcium: 9.6 mg/dL (ref 8.9–10.3)
Chloride: 101 mmol/L (ref 98–111)
Creatinine, Ser: 1.41 mg/dL — ABNORMAL HIGH (ref 0.61–1.24)
GFR, Estimated: 54 mL/min — ABNORMAL LOW (ref >60)
Glucose, Bld: 113 mg/dL — ABNORMAL HIGH (ref 70–99)
Potassium: 4.3 mmol/L (ref 3.5–5.1)
Sodium: 134 mmol/L — ABNORMAL LOW (ref 135–145)
Total Bilirubin: 0.9 mg/dL (ref 0.3–1.2)
Total Protein: 7.9 g/dL (ref 6.5–8.1)

## 2022-10-19 LAB — PROTIME-INR
INR: 1.1 (ref 0.8–1.2)
Prothrombin Time: 14.2 seconds (ref 11.4–15.2)

## 2022-10-19 LAB — CBG MONITORING, ED: Glucose-Capillary: 98 mg/dL (ref 70–99)

## 2022-10-19 LAB — MAGNESIUM: Magnesium: 2.1 mg/dL (ref 1.7–2.4)

## 2022-10-19 LAB — HEMOGLOBIN A1C
Hgb A1c MFr Bld: 6 % — ABNORMAL HIGH (ref 4.8–5.6)
Mean Plasma Glucose: 125.5 mg/dL

## 2022-10-19 LAB — APTT: aPTT: 31 seconds (ref 24–36)

## 2022-10-19 LAB — ETHANOL: Alcohol, Ethyl (B): <10 mg/dL (ref <10)

## 2022-10-19 MED ORDER — ROSUVASTATIN CALCIUM 20 MG PO TABS
20.0000 mg | ORAL_TABLET | Freq: Every evening | ORAL | Status: DC
  Administered 2022-10-19 – 2022-10-21 (×3): 20 mg via ORAL
  Filled 2022-10-19 (×3): qty 1

## 2022-10-19 MED ORDER — IOHEXOL 350 MG/ML SOLN
75.0000 mL | Freq: Once | INTRAVENOUS | Status: AC | PRN
  Administered 2022-10-19: 75 mL via INTRAVENOUS

## 2022-10-19 MED ORDER — METOPROLOL TARTRATE 5 MG/5ML IV SOLN
2.5000 mg | INTRAVENOUS | Status: AC | PRN
  Administered 2022-10-19 (×3): 2.5 mg via INTRAVENOUS
  Filled 2022-10-19 (×2): qty 5

## 2022-10-19 MED ORDER — ACETAMINOPHEN 650 MG RE SUPP: 650.0000 mg | RECTAL | Status: DC | PRN

## 2022-10-19 MED ORDER — HEPARIN (PORCINE) 25000 UT/250ML-% IV SOLN
1250.0000 [IU]/h | INTRAVENOUS | Status: DC
  Administered 2022-10-19: 1000 [IU]/h via INTRAVENOUS
  Filled 2022-10-19: qty 250

## 2022-10-19 MED ORDER — SODIUM CHLORIDE 0.9 % IV SOLN: INTRAVENOUS | Status: DC

## 2022-10-19 MED ORDER — ACETAMINOPHEN 160 MG/5ML PO SOLN: 650.0000 mg | ORAL | Status: DC | PRN

## 2022-10-19 MED ORDER — STROKE: EARLY STAGES OF RECOVERY BOOK
Freq: Once | Status: AC
  Filled 2022-10-19: qty 1

## 2022-10-19 MED ORDER — SODIUM CHLORIDE 0.9% FLUSH: 3.0000 mL | Freq: Once | INTRAVENOUS | Status: DC

## 2022-10-19 MED ORDER — DILTIAZEM HCL-DEXTROSE 125-5 MG/125ML-% IV SOLN (PREMIX)
5.0000 mg/h | INTRAVENOUS | Status: DC
  Administered 2022-10-19 – 2022-10-20 (×2): 5 mg/h via INTRAVENOUS
  Filled 2022-10-19 (×2): qty 125

## 2022-10-19 MED ORDER — ACETAMINOPHEN 325 MG PO TABS: 650.0000 mg | ORAL_TABLET | ORAL | Status: DC | PRN

## 2022-10-19 MED ORDER — DOCUSATE SODIUM 100 MG PO CAPS
100.0000 mg | ORAL_CAPSULE | Freq: Two times a day (BID) | ORAL | Status: DC
  Administered 2022-10-19 – 2022-10-20 (×2): 100 mg via ORAL
  Filled 2022-10-19 (×2): qty 1

## 2022-10-19 NOTE — Progress Notes (Signed)
ANTICOAGULATION CONSULT NOTE - Initial Consult  Pharmacy Consult for Heparin Indication:  AF / Stroke protocol (pending MRI for code stroke) - stroke protocol no bolus  Allergies  Allergen Reactions   Sulfa Antibiotics Hives    Patient Measurements: Height: 5\' 7"  (170.2 cm) Weight: 83 kg (182 lb 15.7 oz) IBW/kg (Calculated) : 66.1 Heparin Dosing Weight: 82.7 kg  Vital Signs: Temp: 98.4 F (36.9 C) (04/29 1324) Temp Source: Oral (04/29 1324) BP: 135/78 (04/29 1445) Pulse Rate: 40 (04/29 1445)  Labs: Recent Labs    10/19/22 1300  HGB 16.3  17.7*  HCT 50.3  52.0  PLT 268  APTT 31  LABPROT 14.2  INR 1.1  CREATININE 1.41*  1.40*    Estimated Creatinine Clearance: 51 mL/min (A) (by C-G formula based on SCr of 1.41 mg/dL (H)).   Medical History: Past Medical History:  Diagnosis Date   Arthritis    BPH associated with nocturia    Carotid stenosis, asymptomatic, bilateral 2017   followed by pcp and vascular dr Chestine Spore;    s/p left CEA 02-25-2022 for stenosis >80%; (post op note in epic 03-17-2022 healing well)   last duplex in epic 02-03-2022 right ICA 40-59% and left ICA 80-99%   Elevated blood pressure reading without diagnosis of hypertension    Family history of prostate cancer    History of anal fissures    Hyperlipidemia    Malignant neoplasm prostate [facility]) 04/2022   urologist--- dr newsome/  radiation oncologist--- dr Kathrynn Running;  dx 11/ 2023,  Gleason 4+3   Microcytic anemia    Assessment: 71 yom with a history of left carotid stenosis status post endarterectomy, HTN, HLD, prostate cancer. Patient is presenting as a code stroke. No TNK administered. Anticoagulation needed for AF. Neurology recommending stroke protocol heparin given pending MRI and uncertain presence/size of stroke.   Heparin per pharmacy consult placed for  AF / Stroke protocol (pending MRI for code stroke) - stroke protocol no bolus . Patient is not on anticoagulation prior to arrival.  Hgb  17.7; plt 268 aPTT 31 PT/INR 14.2/1.1  Goal of Therapy:  Heparin level 0.3-0.5 units/ml Monitor platelets by anticoagulation protocol: Yes   Plan:  Heparin stroke protocol no bolus Start heparin infusion at 1000 units/hr Check anti-Xa level in 8 hours and daily while on heparin Continue to monitor H&H and platelets  Delmar Landau, PharmD, BCPS 10/19/2022 3:24 PM ED Clinical Pharmacist -  [phone]

## 2022-10-19 NOTE — ED Provider Notes (Signed)
[place] EMERGENCY DEPARTMENT AT [facility] Provider Note   CSN: [id number] Arrival date & time: 10/19/22  1251     History  Chief Complaint  Patient presents with   Code Stroke   Irregular Heart Beat    Peter Roberts is a 72 y.o. male with prostate cancer, HTN, CKD stage III who presents with stroke code.  Patient presents with acute onset dizziness after going for a walk associated with chest pressure. He has never felt this before. This was at 1145 AM. When EMS arrived, he was having difficulty speaking. Felt normal at 1000 AM. Also c/o headache as well. When he arrived to the ED, his speech was back to normal. EMS thought they noticed some facial droop which was also not present on arrival to the ED. Patient denied any N/T, asymmetric weakness, history of stroke. He was scheduled for a sigmoidoscopy today d/t rectal bleeding, and he is currently  not receiving any treatment for his prostate cancer but will soon be starting chemo/radiation. He states he feels very slight chest pressure now but otherwise feels back to his normal.  EMS notes patient with Afib w/ RVR on rhythm strips. Patient states he has no history of this. He states that yesterday he played out in the yard with his family, had no dizziness/chest pressure at all. He was stroke-coded from triage.  HPI     Home Medications Prior to Admission medications   Medication Sig Start Date End Date Taking? Authorizing Provider  apixaban (ELIQUIS) 5 MG TABS tablet Take 1 tablet (5 mg total) by mouth 2 (two) times daily. 10/28/22 11/27/22  Eustace Pen, PA-C  Cholecalciferol (VITAMIN D3) 125 MCG (5000 UT) capsule Take 5,000 Units by mouth daily.    [provider]  diclofenac Sodium (VOLTAREN) 1 % GEL Apply 4 g topically 4 (four) times daily as needed (pain).    [provider]  Ferrous Sulfate (IRON) 90 (18 Fe) MG TABS Take 90 mg by mouth every evening.    [provider]  Magnesium 500 MG  CAPS Take 1 capsule by mouth every evening.    [provider]  metoprolol succinate (TOPROL-XL) 25 MG 24 hr tablet Take 1 tablet (25 mg total) by mouth daily. 10/28/22 11/27/22  Eustace Pen, PA-C  Oil of Oregano 1500 MG CAPS Take 1 capsule by mouth daily.    [provider]  OVER THE COUNTER MEDICATION Take 3 capsules by mouth daily after lunch. Prostagenix    [provider]  OVER THE COUNTER MEDICATION Take 1 tablet by mouth daily. Circ O2 Patient not taking: Reported on 10/28/2022    [provider]  Probiotic Product (PROBIOTIC DAILY) CAPS Take 1 capsule by mouth daily.    [provider]  rosuvastatin (CRESTOR) 20 MG tablet Take 1 tablet (20 mg total) by mouth every evening. Patient taking differently: Take 20 mg by mouth every evening. 02/26/22   Cephus Shelling, MD      Allergies    Sulfa antibiotics    Review of Systems   Review of Systems Review of systems Negative for f/c.  A 10 point review of systems was performed and is negative unless otherwise reported in HPI.  Physical Exam Updated Vital Signs BP (!) 160/89 (BP Location: Left Arm)   Pulse 65   Temp 97.9 F (36.6 C) (Temporal)   Resp 14   Ht 5\' 7"  (1.702 m)   Wt 83 kg   SpO2 100%  BMI 28.66 kg/m  Physical Exam General: Normal appearing male, lying in bed.  HEENT: PERRLA, EOMI, no nystagmus, Sclera anicteric, MMM, trachea midline. Symmetric facial features. Tongue protrudes midline. Cardiology: Tachcyardic irregularly irregular rhythm, no murmurs/rubs/gallops. BL radial and DP pulses equal bilaterally.  Resp: Normal respiratory rate and effort. CTAB, no wheezes, rhonchi, crackles.  Abd: Soft, non-tender, non-distended. No rebound tenderness or guarding.  GU: Deferred. MSK: No peripheral edema or signs of trauma. Extremities without deformity or TTP. No cyanosis or clubbing. Skin: warm, dry. No rashes or lesions. Back: No CVA tenderness Neuro: A&Ox4, CNs II-XII  grossly intact. 5/5 strength in all extremities. Sensation grossly intact. Normal speech. Psych: Normal mood and affect.   1a  Level of consciousness: 0=alert; keenly responsive  1b. LOC questions:  0=Performs both tasks correctly  1c. LOC commands: 0=Performs both tasks correctly  2.  Best Gaze: 0=normal  3.  Visual: 0=No visual loss  4. Facial Palsy: 0=Normal symmetric movement  5a.  Motor left arm: 0=No drift, limb holds 90 (or 45) degrees for full 10 seconds  5b.  Motor right arm: 0=No drift, limb holds 90 (or 45) degrees for full 10 seconds  6a. motor left leg: 0=No drift, limb holds 90 (or 45) degrees for full 10 seconds  6b  Motor right leg:  0=No drift, limb holds 90 (or 45) degrees for full 10 seconds  7. Limb Ataxia: 0=Absent  8.  Sensory: 0=Normal; no sensory loss  9. Best Language:  0=No aphasia, normal  10. Dysarthria: 0=Normal  11. Extinction and Inattention: 0=No abnormality   Total:   0        ED Results / Procedures / Treatments   Labs (all labs ordered are listed, but only abnormal results are displayed) Labs Reviewed  CBC - Abnormal; Notable for the following components:      Result Value   WBC 12.5 (*)    All other components within normal limits  DIFFERENTIAL - Abnormal; Notable for the following components:   Neutro Abs 10.1 (*)    All other components within normal limits  COMPREHENSIVE METABOLIC PANEL - Abnormal; Notable for the following components:   Sodium 134 (*)    CO2 21 (*)    Glucose, Bld 113 (*)    BUN 24 (*)    Creatinine, Ser 1.41 (*)    GFR, Estimated 54 (*)    All other components within normal limits  HEPARIN LEVEL (UNFRACTIONATED) - Abnormal; Notable for the following components:   Heparin Unfractionated <0.10 (*)    All other components within normal limits  HEMOGLOBIN A1C - Abnormal; Notable for the following components:   Hgb A1c MFr Bld 6.0 (*)    All other components within normal limits  HEPARIN LEVEL (UNFRACTIONATED) -  Abnormal; Notable for the following components:   Heparin Unfractionated 0.21 (*)    All other components within normal limits  HEPARIN LEVEL (UNFRACTIONATED) - Abnormal; Notable for the following components:   Heparin Unfractionated 0.82 (*)    All other components within normal limits  BASIC METABOLIC PANEL - Abnormal; Notable for the following components:   Glucose, Bld 109 (*)    All other components within normal limits  I-STAT CHEM 8, ED - Abnormal; Notable for the following components:   BUN 26 (*)    Creatinine, Ser 1.40 (*)    Glucose, Bld 112 (*)    Hemoglobin 17.7 (*)    All other components within normal limits  TROPONIN I (HIGH SENSITIVITY) -  Abnormal; Notable for the following components:   Troponin I (High Sensitivity) 140 (*)    All other components within normal limits  TROPONIN I (HIGH SENSITIVITY) - Abnormal; Notable for the following components:   Troponin I (High Sensitivity) 126 (*)    All other components within normal limits  PROTIME-INR  APTT  ETHANOL  MAGNESIUM  LIPID PANEL  RAPID URINE DRUG SCREEN, HOSP PERFORMED  HEPARIN LEVEL (UNFRACTIONATED)  CBC WITH DIFFERENTIAL/PLATELET  CBG MONITORING, ED  CBG MONITORING, ED  SURGICAL PATHOLOGY    EKG None  Radiology See ED course  Procedures .Critical Care  Performed by: Loetta Rough, MD Authorized by: Loetta Rough, MD   Critical care provider statement:    Critical care time (minutes):  42   Critical care was necessary to treat or prevent imminent or life-threatening deterioration of the following conditions:  CNS failure or compromise and cardiac failure   Critical care was time spent personally by me on the following activities:  Development of treatment plan with patient or surrogate, discussions with consultants, evaluation of patient's response to treatment, examination of patient, ordering and review of laboratory studies, ordering and review of radiographic studies, ordering and  performing treatments and interventions, pulse oximetry, re-evaluation of patient's condition, review of old charts and obtaining history from patient or surrogate   Care discussed with: admitting provider       Medications Ordered in ED Medications  heparin ADULT infusion 100 units/mL (25000 units/267mL) (0 Units/hr Intravenous Stopped 10/21/22 0906)  perflutren lipid microspheres (DEFINITY) IV suspension (2 mLs Intravenous Given 10/20/22 1102)  iohexol (OMNIPAQUE) 350 MG/ML injection 75 mL (75 mLs Intravenous Contrast Given 10/19/22 1310)  metoprolol tartrate (LOPRESSOR) injection 2.5 mg (2.5 mg Intravenous Given 10/19/22 1621)   stroke: early stages of recovery book ( Does not apply Given 10/20/22 1209)  iohexol (OMNIPAQUE) 350 MG/ML injection 75 mL (75 mLs Intravenous Contrast Given 10/19/22 1709)  iohexol (OMNIPAQUE) 350 MG/ML injection 95 mL (95 mLs Intravenous Contrast Given 10/20/22 1137)  heparin bolus via infusion 1,000 Units (1,000 Units Intravenous Bolus from Bag 10/20/22 1210)    ED Course/ Medical Decision Making/ A&P                          Medical Decision Making Amount and/or Complexity of Data Reviewed Labs: ordered. Decision-making details documented in ED Course. Radiology: ordered. Decision-making details documented in ED Course.  Risk Prescription drug management. Decision regarding hospitalization.    This patient presents to the ED for concern of dizziness, chest pressure, headache, difficulty speaking; this involves an extensive number of treatment options, and is a complaint that carries with it a high risk of complications and morbidity.  I considered the following differential and admission for this acute, potentially life threatening condition.   MDM:    Given the acute onset of neurological symptoms that have now resolved, TIA vs CVA is the most concerning etiology of these acute symptoms. The neuro exam is wnl at this time. Additionally, patient found to have  Afib w/ RVR. It is entirely possible that his symptoms he described are actually due to his arrhythmia. Also possible that his arrhythmia caused an embolus that caused a TIA or a stroke. Consider also electrolyte abnormalities, hypo/hyperglycemia especially with preparation for sigmoidoscopy, ACS, metastasis from known cancer.   I had long discussion with patient and his daughters as well as the neurologist Dr. Wilford Corner about patient's Afib w/ RVR. His blood  pressure is stable and patient is overall well-appearing, do not believe he requires emergent cardioversion. We initially considered electively cardioverting this patient given that he is very sure that his symptoms just started this morning, that yesterday he was fine. He doesn't take a blood thinner but this would be within 48 hour window. However, given his possible TIA/stroke, potentially less sure that this hasn't been caused by pre-existing Afib that patient is unaware of. Will forego cardioversion d/t risk of embolic stroke and instead treat with IV metoprolol 2.5 mg  x3 doses q46min PRN.   Clinical Course as of 10/28/22 1832  Mon Oct 19, 2022  1354 Glucose-Capillary: 98 [HN]  1354 CT HEAD CODE STROKE WO CONTRAST 1. Possible acute to subacute infarct in the left corona radiata. Consider MRI for further evaluation. 2. No emergent vascular finding. 3. 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 Mild atherosclerotic irregularity in the left without significant stenosis. 4. 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 5. Patent intracranial vasculature.  Findings communicated to Dr. Wilford Corner at 1:15 pm.   [HN]  1646 HR still in 130s. Starting dilt gtt for afib w/ RVR as well as heparin. Consulted and admitted to hospitalist.  [HN]    Clinical Course User Index [HN] Loetta Rough, MD    Labs: I Ordered, and personally interpreted labs.  The pertinent results include:  those  listed above,  BMP showed CKD stage IIIa at his baseline.  Mild hyponatremia.    Imaging Studies ordered: I ordered imaging studies including CTH, CTA H&N, MRI Brain wo contrast I independently visualized and interpreted imaging. I agree with the radiologist interpretation  Additional history obtained from chart review, EMS, daughters at bedside.    Cardiac Monitoring: The patient was maintained on a cardiac monitor.  I personally viewed and interpreted the cardiac monitored which showed an underlying rhythm of: Afib w/ RVR  Reevaluation: After the interventions noted above, I reevaluated the patient and found that they have :improved  Social Determinants of Health: Patient lives independently   Disposition:  Admit on dilt gtt  Co morbidities that complicate the patient evaluation  Past Medical History:  Diagnosis Date   Arthritis    BPH associated with nocturia    Carotid stenosis, asymptomatic, bilateral 2017   followed by pcp and vascular dr Chestine Spore;    s/p left CEA 02-25-2022 for stenosis >80%; (post op note in epic 03-17-2022 healing well)   last duplex in epic 02-03-2022 right ICA 40-59% and left ICA 80-99%   Elevated blood pressure reading without diagnosis of hypertension    Family history of prostate cancer    History of anal fissures    Hyperlipidemia    Malignant neoplasm prostate (HCC) 04/2022   urologist--- dr newsome/  radiation oncologist--- dr Kathrynn Running;  dx 11/ 2023,  Gleason 4+3   Microcytic anemia      Medicines Meds ordered this encounter  Medications   DISCONTD: sodium chloride flush (NS) 0.9 % injection 3 mL   iohexol (OMNIPAQUE) 350 MG/ML injection 75 mL   metoprolol tartrate (LOPRESSOR) injection 2.5 mg   DISCONTD: heparin ADULT infusion 100 units/mL (25000 units/260mL)   DISCONTD: diltiazem (CARDIZEM) 125 mg in dextrose 5% 125 mL (1 mg/mL) infusion   DISCONTD: rosuvastatin (CRESTOR) tablet 20 mg   DISCONTD: docusate sodium (COLACE) capsule 100 mg     stroke: early stages of recovery book   DISCONTD: 0.9 %  sodium chloride infusion   DISCONTD:  acetaminophen (TYLENOL) tablet 650 mg   DISCONTD: acetaminophen (TYLENOL) 160 MG/5ML solution 650 mg   DISCONTD: acetaminophen (TYLENOL) suppository 650 mg   iohexol (OMNIPAQUE) 350 MG/ML injection 75 mL   DISCONTD: apixaban (ELIQUIS) tablet 5 mg   heparin ADULT infusion 100 units/mL (25000 units/297mL)   perflutren lipid microspheres (DEFINITY) IV suspension   iohexol (OMNIPAQUE) 350 MG/ML injection 95 mL   heparin bolus via infusion 1,000 Units   DISCONTD: 0.9 %  sodium chloride infusion   DISCONTD: metoprolol succinate (TOPROL-XL) 24 hr tablet 25 mg   DISCONTD: metoprolol succinate (TOPROL-XL) 25 MG 24 hr tablet    Sig: Take 1 tablet (25 mg total) by mouth daily.    Dispense:  30 tablet    Refill:  0   DISCONTD: apixaban (ELIQUIS) 5 MG TABS tablet    Sig: Take 1 tablet (5 mg total) by mouth 2 (two) times daily.    Dispense:  60 tablet    Refill:  0   DISCONTD: lactated ringers infusion    I have reviewed the patients home medicines and have made adjustments as needed  Problem List / ED Course: Problem List Items Addressed This Visit       Cardiovascular and Mediastinum   Atrial fibrillation with RVR (HCC) - Primary   Other Visit Diagnoses     Dizziness                       This note was created using dictation software, which may contain spelling or grammatical errors.    Loetta Rough, MD 10/31/22 1400

## 2022-10-19 NOTE — Significant Event (Signed)
Accepted patient into 3W14 at 1815pm. Patient arrived via stretcher. Saw patient ambulated from stretcher into room with ED RN; noted patient was disconnected from his Heparin drip and Cardizem drip by the ED RN. This RN reconnected the drips back to patient's PIVs.   Per ED RN, MRI is ready for patient but she did not take him there from ED due to patient receiving a bed at 3W. This RN called MRI to take him there, however, MRI staff says they were ready for patient more than two hours ago and not at this as they are scanning other patients at this time. MRI says they will call unit when they are ready for patient again.

## 2022-10-19 NOTE — Consult Note (Addendum)
Neurology Consultation  Reason for Consult: Code stroke Referring Physician: Dr. Derrek Monaco  CC: Code stroke-dizziness, aphasia  History is obtained from: Patient, chart  HPI: Peter Roberts is a 72 y.o. male past medical history of prostate cancer, BPH, hypertension, hyperlipidemia, hypertension, hyperlipidemia, left carotid endarterectomy last year, presenting to the emergency room for evaluation of dizziness and aphasia. Was taking a walk at 10 AM, normal and around 11 noticed some dizziness.  Called EMS and was having a hard time getting his words out.  EMS brought him as a code stroke. On initial evaluation, no deficits noted-NIH stroke scale 0.  On further evaluation with the stroke cards there was a question of mild aphasia with an NIH stroke scale of 1. Importantly-new onset A-fib noted on telemetry.  Currently retired-prior to that worked at [employer].  LKW: 10 AM IV thrombolysis given?: no, mild symptoms EVT: No ELVO Premorbid modified Rankin scale (mRS): 0   ROS: Full ROS was performed and is negative except as noted in the HPI.   Past Medical History:  Diagnosis Date   Arthritis    BPH associated with nocturia    Carotid stenosis, asymptomatic, bilateral 2017   followed by pcp and vascular dr Chestine Spore;    s/p left CEA 02-25-2022 for stenosis >80%; (post op note in epic 03-17-2022 healing well)   last duplex in epic 02-03-2022 right ICA 40-59% and left ICA 80-99%   Elevated blood pressure reading without diagnosis of hypertension    Family history of prostate cancer    History of anal fissures    Hyperlipidemia    Malignant neoplasm prostate [facility]) 04/2022   urologist--- dr newsome/  radiation oncologist--- dr Kathrynn Running;  dx 11/ 2023,  Gleason 4+3   Microcytic anemia    Family History  Problem Relation Age of Onset   Lung disease Mother    Heart attack Father    Heart disease Father     Social History:   reports that he has never smoked. He has never been exposed to  tobacco smoke. He has never used smokeless tobacco. He reports that he does not drink alcohol and does not use drugs.  Medications  Current Facility-Administered Medications:    sodium chloride flush (NS) 0.9 % injection 3 mL, 3 mL, Intravenous, Once, Loetta Rough, MD  Current Outpatient Medications:    amLODipine (NORVASC) 5 MG tablet, Take 1 tablet (5 mg total) by mouth daily., Disp: 30 tablet, Rfl: 11   Cholecalciferol (VITAMIN D3) 125 MCG (5000 UT) capsule, Take 5,000 Units by mouth daily., Disp: , Rfl:    diclofenac Sodium (VOLTAREN) 1 % GEL, Apply 4 g topically 4 (four) times daily as needed (pain)., Disp: , Rfl:    Docusate Calcium (STOOL SOFTENER PO), Take 1 capsule by mouth daily as needed (constipation)., Disp: , Rfl:    Ferrous Sulfate (IRON) 90 (18 Fe) MG TABS, Take 90 mg by mouth every evening., Disp: , Rfl:    Magnesium 500 MG CAPS, Take 1 capsule by mouth every evening., Disp: , Rfl:    Oil of Oregano 1500 MG CAPS, Take 1 capsule by mouth daily., Disp: , Rfl:    OVER THE COUNTER MEDICATION, Take 3 capsules by mouth daily after lunch. Prostagenix, Disp: , Rfl:    OVER THE COUNTER MEDICATION, Take 1 tablet by mouth daily. Circ O2, Disp: , Rfl:    Probiotic Product (PROBIOTIC DAILY) CAPS, Take 1 capsule by mouth daily., Disp: , Rfl:    rosuvastatin (  CRESTOR) 20 MG tablet, Take 1 tablet (20 mg total) by mouth every evening. (Patient taking differently: Take 20 mg by mouth every evening.), Disp: 90 tablet, Rfl: 2   Exam: Current vital signs: Temp 98.4 F (36.9 C) (Oral)   Ht 5\' 7"  (1.702 m)   Wt 83 kg   BMI 28.66 kg/m  Vital signs in last 24 hours: Temp:  [98.4 F (36.9 C)] 98.4 F (36.9 C) (04/29 1324) Weight:  [83 kg-83.8 kg] 83 kg (04/29 1325)  GENERAL: Awake, alert in NAD HEENT: - Normocephalic and atraumatic, dry mm, no LN++, no Thyromegally LUNGS - Clear to auscultation bilaterally with no wheezes CV - S1S2 RRR, no m/r/g, equal pulses bilaterally. ABDOMEN -  Soft, nontender, nondistended with normoactive BS Ext: warm, well perfused, intact peripheral pulses, __ edema  NEURO:  Mental Status: AA&Ox3  Language: speech is fluent and nondysarthric.very mild difficulty in naming a couple of objects on the stroke scale cards, otherwise naming comprehension repetition intact. Cranial Nerves: 2-12 intact Motor: No drift in any of the 4 extremities Tone: is normal and bulk is normal Sensation- Intact to light touch bilaterally Coordination: FTN intact bilaterally, no ataxia in BLE. Gait- deferred  NIHSS-1 Labs I have reviewed labs in epic and the results pertinent to this consultation are:  CBC    Component Value Date/Time   WBC 12.5 (H) 10/19/2022 1300   RBC 5.78 10/19/2022 1300   HGB 16.3 10/19/2022 1300   HGB 17.7 (H) 10/19/2022 1300   HCT 50.3 10/19/2022 1300   HCT 52.0 10/19/2022 1300   PLT 268 10/19/2022 1300   MCV 87.0 10/19/2022 1300   MCH 28.2 10/19/2022 1300   MCHC 32.4 10/19/2022 1300   RDW 14.3 10/19/2022 1300   LYMPHSABS 1.5 10/19/2022 1300   MONOABS 0.7 10/19/2022 1300   EOSABS 0.1 10/19/2022 1300   BASOSABS 0.1 10/19/2022 1300    CMP     Component Value Date/Time   NA 138 10/19/2022 1300   K 4.2 10/19/2022 1300   CL 104 10/19/2022 1300   CO2 23 08/25/2022 0451   GLUCOSE 112 (H) 10/19/2022 1300   BUN 26 (H) 10/19/2022 1300   CREATININE 1.40 (H) 10/19/2022 1300   CALCIUM 8.6 (L) 08/25/2022 0451   PROT 7.5 08/23/2022 0323   ALBUMIN 3.8 08/23/2022 0323   AST 18 08/23/2022 0323   ALT 15 08/23/2022 0323   ALKPHOS 72 08/23/2022 0323   BILITOT 0.6 08/23/2022 0323   GFRNONAA 59 (L) 08/25/2022 0451    Lipid Panel     Component Value Date/Time   CHOL 140 02/26/2022 0422   TRIG 36 02/26/2022 0422   HDL 57 02/26/2022 0422   CHOLHDL 2.5 02/26/2022 0422   VLDL 7 02/26/2022 0422   LDLCALC 76 02/26/2022 0422   Imaging I have reviewed the images obtained:  CT-head-possible acute to subacute left corona radiata  infarct. CTA head and neck-no ELVO.  MRI examination of the brain  Assessment: 72 year old with history of left carotid stenosis status post endarterectomy, hypertension, hyperlipidemia, prostate cancer presenting for evaluation of dizziness followed by some aphasia which has been intermittent.  On my examination I question very subtle expressive aphasia. Symptoms too mild to treat for TNKase. No ELVO on vessel imaging. Has new onset A-fib Differentials include small cardioembolic stroke versus TIA.  Dizziness could be secondary to the arrhythmia but the aphasia symptoms make me worried about the stroke/TIA.  Recommendations: -Admit to hospitalist or observation -Telemetry monitoring -Allow for permissive hypertension  for the first 24-48h - only treat PRN if SBP >220 mmHg. Blood pressures can be gradually normalized to SBP<140 upon discharge. -MRI brain without contrast -Echocardiogram -HgbA1c, fasting lipid panel -Frequent neuro checks -Prophylactic therapy-long-term will be anticoagulation with DOAC.  Given the fact that we do not have an MRI to look at whether there is a stroke or what size it is, I would recommend heparin drip-stroke protocol no bolus. -Atorvastatin 80 mg PO daily -Risk factor modification -PT consult, OT consult, Speech consult  Stroke team to follow Plan discussed with the EDP.  -- Milon Dikes, MD Neurologist [facility] Pager: [phone]

## 2022-10-19 NOTE — H&P (Signed)
History and Physical    Peter Roberts NUU:[id number] DOB: [birth year] DOA: 10/19/2022  PCP: Elias Else, MD (Inactive)  Patient coming from: Home  I have personally briefly reviewed patient's old medical records in [facility]  Chief Complaint: Dizziness, chest pain and dysarthria  HPI: Peter Roberts is a 72 y.o. male with medical history significant of hypertension, hyperlipidemia recent diagnosis of prostate cancer in November and multiple other comorbidities was brought into the emergency department with several complaints.  History was mostly obtained by the daughter who is at bedside.  According to her, scheduled to have sigmoidoscopy today so he has been n.p.o. since last night, this morning before going for sigmoidoscopy, he decided to take a walk and while he was doing that, he started having chest pressure and decided to go back home, when he arrived home, he felt dizziness.  He called his daughter and his son-in-law went to check on him.  This was around 11:45 AM.  EMS was called and when EMS arrived, patient started having some difficulty speaking.  Per daughter, this lasted only about half an hour and by the time patient arrived to the ED, his speech was back to normal.  Patient never had any deficit or weakness to any part of the body.  Per daughter, EMS also noted some facial droop but that also disappeared upon arrival to the ED.  No other complaint.  No recent history of TAVR, and history of sick contact and no previous history of thromboembolism.  Per daughter, he was going to get sigmoidoscopy due to him having some rectal bleeding and March and he was going to start treatment, either radiation or chemo for his prostate cancer.  No history of fever, chills, sweating, abdominal pain, any problem with urination or bowel movement.  ED Course: Upon arrival to ED, patient was in atrial fibrillation with slightly elevated blood pressure with mild tachycardia.  CBC shows mild  leukocytosis.  BMP showed CKD stage IIIa at his baseline.  Mild hyponatremia.  Due to stroke features, code stroke was called, patient underwent CT head as well as CT angio head and neck and was found to have "Possible acute to subacute infarct in the left corona radiata".  MRI recommended.  Neurology consulted.  Patient was also started on heparin drip as well as diltiazem drip.  Since patient's symptoms started to improve, thrombolytics/tPA were not recommended by neurology.  Review of Systems: As per HPI otherwise negative.    Past Medical History:  Diagnosis Date   Arthritis    BPH associated with nocturia    Carotid stenosis, asymptomatic, bilateral 2017   followed by pcp and vascular dr Chestine Spore;    s/p left CEA 02-25-2022 for stenosis >80%; (post op note in epic 03-17-2022 healing well)   last duplex in epic 02-03-2022 right ICA 40-59% and left ICA 80-99%   Elevated blood pressure reading without diagnosis of hypertension    Family history of prostate cancer    History of anal fissures    Hyperlipidemia    Malignant neoplasm prostate (HCC) 04/2022   urologist--- dr newsome/  radiation oncologist--- dr Kathrynn Running;  dx 11/ 2023,  Gleason 4+3   Microcytic anemia     Past Surgical History:  Procedure Laterality Date   CARPAL TUNNEL RELEASE Left 12/2021   COLONOSCOPY  2019   ENDARTERECTOMY Left 02/25/2022   Procedure: LEFT CAROTID ENDARTERECTOMY;  Surgeon: Cephus Shelling, MD;  Location: [facility] OR;  Service: Vascular;  Laterality: Left;  KNEE ARTHROSCOPY Right 2002     reports that he has never smoked. He has never been exposed to tobacco smoke. He has never used smokeless tobacco. He reports that he does not drink alcohol and does not use drugs.  Allergies  Allergen Reactions   Sulfa Antibiotics Hives    Family History  Problem Relation Age of Onset   Lung disease Mother    Heart attack Father    Heart disease Father     Prior to Admission medications   Medication Sig Start  Date End Date Taking? Authorizing Provider  amLODipine (NORVASC) 5 MG tablet Take 1 tablet (5 mg total) by mouth daily. 08/25/22 08/25/23  Jonah Blue, MD  Cholecalciferol (VITAMIN D3) 125 MCG (5000 UT) capsule Take 5,000 Units by mouth daily.    [provider]  diclofenac Sodium (VOLTAREN) 1 % GEL Apply 4 g topically 4 (four) times daily as needed (pain).    [provider]  Docusate Calcium (STOOL SOFTENER PO) Take 1 capsule by mouth daily as needed (constipation).    [provider]  Ferrous Sulfate (IRON) 90 (18 Fe) MG TABS Take 90 mg by mouth every evening.    [provider]  Magnesium 500 MG CAPS Take 1 capsule by mouth every evening.    [provider]  Oil of Oregano 1500 MG CAPS Take 1 capsule by mouth daily.    [provider]  OVER THE COUNTER MEDICATION Take 3 capsules by mouth daily after lunch. Prostagenix    [provider]  OVER THE COUNTER MEDICATION Take 1 tablet by mouth daily. Circ O2    [provider]  Probiotic Product (PROBIOTIC DAILY) CAPS Take 1 capsule by mouth daily.    [provider]  rosuvastatin (CRESTOR) 20 MG tablet Take 1 tablet (20 mg total) by mouth every evening. Patient taking differently: Take 20 mg by mouth every evening. 02/26/22   Cephus Shelling, MD    Physical Exam: Vitals:   10/19/22 1332 10/19/22 1445 10/19/22 1500 10/19/22 1600  BP:  135/78 124/86 131/70  Pulse:  (!) 40 (!) 39 (!) 132  Resp:  (!) 25 (!) 25 (!) 28  Temp:      TempSrc:      SpO2: 100% 100% 100% 99%  Weight:      Height:        Constitutional: NAD, calm, comfortable Vitals:   10/19/22 1332 10/19/22 1445 10/19/22 1500 10/19/22 1600  BP:  135/78 124/86 131/70  Pulse:  (!) 40 (!) 39 (!) 132  Resp:  (!) 25 (!) 25 (!) 28  Temp:      TempSrc:      SpO2: 100% 100% 100% 99%  Weight:      Height:       Eyes: PERRL, lids and conjunctivae normal ENMT: Mucous membranes are moist. Posterior  pharynx clear of any exudate or lesions.Normal dentition.  Neck: normal, supple, no masses, no thyromegaly Respiratory: clear to auscultation bilaterally, no wheezing, no crackles. Normal respiratory effort. No accessory muscle use.  Cardiovascular: Regular rate and rhythm, no murmurs / rubs / gallops. No extremity edema. 2+ pedal pulses. No carotid bruits.  Abdomen: no tenderness, no masses palpated. No hepatosplenomegaly. Bowel sounds positive.  Musculoskeletal: no clubbing / cyanosis. No joint deformity upper and lower extremities. Good ROM, no contractures. Normal muscle tone.  Skin: no rashes, lesions, ulcers. No induration Neurologic: CN 2-12 grossly intact. Sensation intact, DTR normal. Strength 5/5 in all 4.  Psychiatric: Normal judgment and insight. Alert and oriented x 3. Normal mood.    Labs on Admission: I have personally reviewed following labs and imaging studies  CBC: Recent Labs  Lab 10/19/22 1300  WBC 12.5*  NEUTROABS 10.1*  HGB 16.3  17.7*  HCT 50.3  52.0  MCV 87.0  PLT 268   Basic Metabolic Panel: Recent Labs  Lab 10/19/22 1300  NA 134*  138  K 4.3  4.2  CL 101  104  CO2 21*  GLUCOSE 113*  112*  BUN 24*  26*  CREATININE 1.41*  1.40*  CALCIUM 9.6   GFR: Estimated Creatinine Clearance: 51 mL/min (A) (by C-G formula based on SCr of 1.41 mg/dL (H)). Liver Function Tests: Recent Labs  Lab 10/19/22 1300  AST 37  ALT 37  ALKPHOS 78  BILITOT 0.9  PROT 7.9  ALBUMIN 4.3   No results for input(s): "LIPASE", "AMYLASE" in the last 168 hours. No results for input(s): "AMMONIA" in the last 168 hours. Coagulation Profile: Recent Labs  Lab 10/19/22 1300  INR 1.1   Cardiac Enzymes: No results for input(s): "CKTOTAL", "CKMB", "CKMBINDEX", "TROPONINI" in the last 168 hours. BNP (last 3 results) No results for input(s): "PROBNP" in the last 8760 hours. HbA1C: No results for input(s): "HGBA1C" in the last 72 hours. CBG: Recent Labs  Lab  10/19/22 1253  GLUCAP 98   Lipid Profile: No results for input(s): "CHOL", "HDL", "LDLCALC", "TRIG", "CHOLHDL", "LDLDIRECT" in the last 72 hours. Thyroid Function Tests: No results for input(s): "TSH", "T4TOTAL", "FREET4", "T3FREE", "THYROIDAB" in the last 72 hours. Anemia Panel: No results for input(s): "VITAMINB12", "FOLATE", "FERRITIN", "TIBC", "IRON", "RETICCTPCT" in the last 72 hours. Urine analysis:    Component Value Date/Time   COLORURINE YELLOW 02/19/2022 1024   APPEARANCEUR CLEAR 02/19/2022 1024   LABSPEC 1.018 02/19/2022 1024   PHURINE 5.0 02/19/2022 1024   GLUCOSEU NEGATIVE 02/19/2022 1024   HGBUR NEGATIVE 02/19/2022 1024   BILIRUBINUR NEGATIVE 02/19/2022 1024   KETONESUR 5 (A) 02/19/2022 1024   PROTEINUR NEGATIVE 02/19/2022 1024   NITRITE NEGATIVE 02/19/2022 1024   LEUKOCYTESUR NEGATIVE 02/19/2022 1024    Radiological Exams on Admission: CT HEAD CODE STROKE WO CONTRAST  Result Date: 10/19/2022 CLINICAL DATA:  Code stroke. New onset dizziness and intermittent aphasia, resolved. EXAM: CT ANGIOGRAPHY HEAD AND NECK TECHNIQUE: Multidetector CT imaging of the head and neck was performed using the standard protocol during bolus administration of intravenous contrast. Multiplanar CT image reconstructions and MIPs were obtained to evaluate the vascular anatomy. Carotid stenosis measurements (when applicable) are obtained utilizing NASCET criteria, using the distal internal carotid diameter as the denominator. RADIATION DOSE REDUCTION: This exam was performed according to the departmental dose-optimization program which includes automated exposure control, adjustment of the mA and/or kV according to patient size and/or use of iterative reconstruction technique. CONTRAST:  75mL OMNIPAQUE IOHEXOL 350 MG/ML SOLN COMPARISON:  None Available. FINDINGS: CT HEAD FINDINGS Brain: There is no evidence of acute intracranial hemorrhage or extra-axial fluid collection. There is no evolved large  vessel territorial infarct. There is hypodensity in the left corona radiata best seen in the coronal sequence which could reflect an acute infarct in the left MCA distribution (4-32). ASPECTS is 9 Background parenchymal volume is normal. The ventricles are normal in size. Additional hypodensity in the supratentorial white matter is nonspecific but likely reflects sequela of underlying chronic small-vessel ischemic change. The pituitary and suprasellar region are normal. There is no mass lesion. There is no mass  effect or midline shift. Vascular: There is calcification of the bilateral carotid siphons. Skull: Normal. Negative for fracture or focal lesion. Sinuses/Orbits: The paranasal sinuses are clear. The globes and orbits are unremarkable. Other: None. Review of the MIP images confirms the above findings CTA NECK FINDINGS Aortic arch: The imaged aortic arch is normal. The origins of the major branch vessels are patent. The subclavian arteries are patent to the level imaged. Right carotid system: The right common carotid artery is patent. There is mixed plaque of the bifurcation resulting in approximately 30-40% stenosis. The distal internal carotid artery is patent. The external carotid artery is patent. There is no evidence of dissection or aneurysm. Left carotid system: The left common carotid artery is patent. There is mild atherosclerotic irregularity at the bifurcation without significant stenosis or occlusion. The distal internal carotid artery is patent. The external carotid artery is patent. There is no evidence of dissection or aneurysm. Vertebral arteries: Plaque of the right vertebral artery origin resulting in moderate stenosis. The vertebral arteries are otherwise patent, without hemodynamically significant stenosis or occlusion. There is no evidence of dissection or aneurysm. Skeleton: There is no acute osseous abnormality or suspicious osseous lesion. Other neck: The soft tissues of the neck are  unremarkable. Upper chest: The imaged lung apices are clear. Review of the MIP images confirms the above findings CTA HEAD FINDINGS Anterior circulation: There is calcified plaque in the intracranial ICAs without greater than mild stenosis. The bilateral MCAs are patent, without proximal stenosis or occlusion. The bilateral ACAs are patent, without proximal stenosis or occlusion. The anterior communicating artery is normal. There is no aneurysm or AVM. Posterior circulation: The bilateral V4 segments are patent. The basilar artery is patent. The major cerebellar arteries appear patent. The bilateral PCAs are patent, without proximal stenosis or occlusion. A small left posterior communicating artery is identified. There is no aneurysm or AVM. Venous sinuses: As permitted by contrast timing, patent. Anatomic variants: None. Review of the MIP images confirms the above findings IMPRESSION: 1. Possible acute to subacute infarct in the left corona radiata. Consider MRI for further evaluation. 2. No emergent vascular finding. 3. 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 Mild atherosclerotic irregularity in the left without significant stenosis. 4. 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 5. Patent intracranial vasculature. Findings communicated to Dr. Wilford Corner at 1:15 pm. Electronically Signed   By: Lesia Hausen M.D.   On: 10/19/2022 13:21   CT ANGIO HEAD NECK W WO CM (CODE STROKE)  Result Date: 10/19/2022 CLINICAL DATA:  Code stroke. New onset dizziness and intermittent aphasia, resolved. EXAM: CT ANGIOGRAPHY HEAD AND NECK TECHNIQUE: Multidetector CT imaging of the head and neck was performed using the standard protocol during bolus administration of intravenous contrast. Multiplanar CT image reconstructions and MIPs were obtained to evaluate the vascular anatomy. Carotid stenosis measurements (when applicable) are obtained utilizing NASCET criteria,  using the distal internal carotid diameter as the denominator. RADIATION DOSE REDUCTION: This exam was performed according to the departmental dose-optimization program which includes automated exposure control, adjustment of the mA and/or kV according to patient size and/or use of iterative reconstruction technique. CONTRAST:  75mL OMNIPAQUE IOHEXOL 350 MG/ML SOLN COMPARISON:  None Available. FINDINGS: CT HEAD FINDINGS Brain: There is no evidence of acute intracranial hemorrhage or extra-axial fluid collection. There is no evolved large vessel territorial infarct. There is hypodensity in the left corona radiata best seen in the coronal sequence which could reflect an  acute infarct in the left MCA distribution (4-32). ASPECTS is 9 Background parenchymal volume is normal. The ventricles are normal in size. Additional hypodensity in the supratentorial white matter is nonspecific but likely reflects sequela of underlying chronic small-vessel ischemic change. The pituitary and suprasellar region are normal. There is no mass lesion. There is no mass effect or midline shift. Vascular: There is calcification of the bilateral carotid siphons. Skull: Normal. Negative for fracture or focal lesion. Sinuses/Orbits: The paranasal sinuses are clear. The globes and orbits are unremarkable. Other: None. Review of the MIP images confirms the above findings CTA NECK FINDINGS Aortic arch: The imaged aortic arch is normal. The origins of the major branch vessels are patent. The subclavian arteries are patent to the level imaged. Right carotid system: The right common carotid artery is patent. There is mixed plaque of the bifurcation resulting in approximately 30-40% stenosis. The distal internal carotid artery is patent. The external carotid artery is patent. There is no evidence of dissection or aneurysm. Left carotid system: The left common carotid artery is patent. There is mild atherosclerotic irregularity at the bifurcation  without significant stenosis or occlusion. The distal internal carotid artery is patent. The external carotid artery is patent. There is no evidence of dissection or aneurysm. Vertebral arteries: Plaque of the right vertebral artery origin resulting in moderate stenosis. The vertebral arteries are otherwise patent, without hemodynamically significant stenosis or occlusion. There is no evidence of dissection or aneurysm. Skeleton: There is no acute osseous abnormality or suspicious osseous lesion. Other neck: The soft tissues of the neck are unremarkable. Upper chest: The imaged lung apices are clear. Review of the MIP images confirms the above findings CTA HEAD FINDINGS Anterior circulation: There is calcified plaque in the intracranial ICAs without greater than mild stenosis. The bilateral MCAs are patent, without proximal stenosis or occlusion. The bilateral ACAs are patent, without proximal stenosis or occlusion. The anterior communicating artery is normal. There is no aneurysm or AVM. Posterior circulation: The bilateral V4 segments are patent. The basilar artery is patent. The major cerebellar arteries appear patent. The bilateral PCAs are patent, without proximal stenosis or occlusion. A small left posterior communicating artery is identified. There is no aneurysm or AVM. Venous sinuses: As permitted by contrast timing, patent. Anatomic variants: None. Review of the MIP images confirms the above findings IMPRESSION: 1. Possible acute to subacute infarct in the left corona radiata. Consider MRI for further evaluation. 2. No emergent vascular finding. 3. 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 Mild atherosclerotic irregularity in the left without significant stenosis. 4. 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 5. Patent intracranial vasculature. Findings communicated to Dr. Wilford Corner at 1:15 pm. Electronically Signed   By: Lesia Hausen M.D.   On:  10/19/2022 13:21    EKG is not available in chart for me to review.  Assessment/Plan Principal Problem:   Acute ischemic stroke [facility]) Active Problems:   Asymptomatic carotid artery stenosis without infarction, left   Chronic kidney disease, stage 3a (HCC)   Essential hypertension   Acute/septic.  Ischemic stroke of left corona radiator: Fortunately, patient does not have any focal deficit, no dysarthria anymore.  Neurology has been consulted.  CT head and CTA of the head and neck are done, no large vessel occlusion.  MRI brain is ordered. -admit forTelemetry monitoring -Stroke protocol -Allow for permissive hypertension for the first 24-48h - only treat PRN if SBP >220 mmHg or diastolic blood pressure >  120. Blood pressures can be gradually normalized to SBP<140 upon discharge. -MRI brain without contrast and MRA head and neck  -Maintain Euthermia.  -ASA given -Echocardiogram to- rule out PFO -Lipid Panel, TSH and A1C -Frequent neuro checks -Resume home dose of Crestor.  May need escalation of the dose or transition to atorvastatin, will defer to neurology. -Risk factor modification -Consult Neurology -PT/OT eval, Speech consult  New onset atrial fibrillation with RVR: Patient has been started on a heparin drip, per ED physician, he will soon be started on Cardizem drip, rates are slightly elevated but patient is asymptomatic at the moment.  I have also consulted cardiology to help with management.  Chest pressure/pain: With him having history of prostate cancer, he is at high risk of thromboembolism.  His presentation can be due to PE as well.  CTA of the chest is pending.  Continue heparin.  History of prostate cancer: Follow-up with urology and oncology as outpatient.  DVT prophylaxis: Heparin drip Code Status: Full code Family Communication: 2 daughters present at bedside.  Plan of care discussed with patient in length and he verbalized understanding and agreed with  it. Disposition Plan: Home when medically optimized.  Likely in the next 2 days. Consults called: Neurology and cardiology  Hughie Closs MD [facility]  *Please note that this is a verbal dictation therefore any spelling or grammatical errors are due to the "[employer] One" system interpretation.  Please page via Amion and do not message via secure chat for urgent patient care matters. Secure chat can be used for non urgent patient care matters. 10/19/2022, 4:52 PM  To contact the attending provider between 7A-7P or the covering provider during after hours 7P-7A, please log into the web site [email or web address]

## 2022-10-19 NOTE — ED Triage Notes (Signed)
Pt to the ed from home with a CC of dizziness and  aphasia per ems. Pt relay she was feeling completely normal at 10 am. Pt relays he has a slight chest pressure and headache. Per ems pt was in a new onset of a fib with RVR. Pt denies and LOC, weakness or any other complaints at this time.

## 2022-10-19 NOTE — Consult Note (Signed)
Cardiology Consultation:   Patient ID: Peter Roberts MRN: [id number]; DOB: [birth year]  Admit date: 10/19/2022 Date of Consult: 10/19/2022  Primary Care Provider: Elias Else, MD (Inactive) [facility] Cardiologist: None  [facility] Electrophysiologist:  None    Patient Profile:   Peter Roberts is a 72 y.o. male with a hx of HLD, HTN, prostate CA, carotid artery stenosis s/p left CEA 9/23  who is being seen today for the evaluation of afib with RVR at the request of Peter Ard, MD.      History of Present Illness:   Mr. Stairs with a hx of HLD, prostate CA, CKD3a, HTN, carotid artery stenosis s/p left CEA 9/23 who was in his USOH until today.  He was supposed to have a sigmoidoscopy today for workup of rectal bleeding and had been NPO since last night.  Prior to going in for his procedure he decided to go out for a walk and on his way back started having CP and diaphoresis.  He also was having problems with his speech.  When he arrived at home he developed lightheadedness and called EMS.  On arrival he was in afib with RVR and intermittent expressive aphasia and a facial droop.  On arrival in ER the facial droop had resolved and there were no other neurological deficits. Code stroke was called and CT angio of the head and neck showed possible acute to subacute infarct in the left corona radiata.  Started on IV Heparin gtt and Cardizem gtt for BP control.  TPA not recommended as sx had improved.  Cardiology is now asked to consult for treatment of afib.   He currently denies any chest pain or pressure, SOB, PND, orthopnea, LE edema, palpitations or syncope. He was not aware of his afib with RVR  CHADS2VASC score 5 (age>65, HTN, PAD, CVA).    Past Medical History:  Diagnosis Date   Arthritis    BPH associated with nocturia    Carotid stenosis, asymptomatic, bilateral 2017   followed by pcp and vascular dr Chestine Spore;    s/p left CEA 02-25-2022 for stenosis >80%; (post op note in epic  03-17-2022 healing well)   last duplex in epic 02-03-2022 right ICA 40-59% and left ICA 80-99%   Elevated blood pressure reading without diagnosis of hypertension    Family history of prostate cancer    History of anal fissures    Hyperlipidemia    Malignant neoplasm prostate (HCC) 04/2022   urologist--- dr newsome/  radiation oncologist--- dr Kathrynn Running;  dx 11/ 2023,  Gleason 4+3   Microcytic anemia     Past Surgical History:  Procedure Laterality Date   CARPAL TUNNEL RELEASE Left 12/2021   COLONOSCOPY  2019   ENDARTERECTOMY Left 02/25/2022   Procedure: LEFT CAROTID ENDARTERECTOMY;  Surgeon: Cephus Shelling, MD;  Location: [facility] OR;  Service: Vascular;  Laterality: Left;   KNEE ARTHROSCOPY Right 2002     Home Medications:  Prior to Admission medications   Medication Sig Start Date End Date Taking? Authorizing Provider  amLODipine (NORVASC) 5 MG tablet Take 1 tablet (5 mg total) by mouth daily. 08/25/22 08/25/23  Jonah Blue, MD  Cholecalciferol (VITAMIN D3) 125 MCG (5000 UT) capsule Take 5,000 Units by mouth daily.    [provider]  diclofenac Sodium (VOLTAREN) 1 % GEL Apply 4 g topically 4 (four) times daily as needed (pain).    [provider]  Docusate Calcium (STOOL SOFTENER PO) Take 1 capsule by mouth daily as needed (  constipation).    [provider]  Ferrous Sulfate (IRON) 90 (18 Fe) MG TABS Take 90 mg by mouth every evening.    [provider]  Magnesium 500 MG CAPS Take 1 capsule by mouth every evening.    [provider]  Oil of Oregano 1500 MG CAPS Take 1 capsule by mouth daily.    [provider]  OVER THE COUNTER MEDICATION Take 3 capsules by mouth daily after lunch. Prostagenix    [provider]  OVER THE COUNTER MEDICATION Take 1 tablet by mouth daily. Circ O2    [provider]  Probiotic Product (PROBIOTIC DAILY) CAPS Take 1 capsule by mouth daily.    [provider]  rosuvastatin  (CRESTOR) 20 MG tablet Take 1 tablet (20 mg total) by mouth every evening. Patient taking differently: Take 20 mg by mouth every evening. 02/26/22   Cephus Shelling, MD    Inpatient Medications: Scheduled Meds:  [START ON 10/20/2022]  stroke: early stages of recovery book   Does not apply Once   docusate sodium  100 mg Oral BID   rosuvastatin  20 mg Oral QPM   sodium chloride flush  3 mL Intravenous Once   Continuous Infusions:  sodium chloride     diltiazem (CARDIZEM) infusion 5 mg/hr (10/19/22 1728)   heparin 1,000 Units/hr (10/19/22 1544)   PRN Meds: acetaminophen **OR** acetaminophen (TYLENOL) oral liquid 160 mg/5 mL **OR** acetaminophen  Allergies:    Allergies  Allergen Reactions   Sulfa Antibiotics Hives    Social History:   Social History   Socioeconomic History   Marital status: Widowed    Spouse name: Not on file   Number of children: Not on file   Years of education: Not on file   Highest education level: Not on file  Occupational History   Not on file  Tobacco Use   Smoking status: Never    Passive exposure: Never   Smokeless tobacco: Never  Vaping Use   Vaping Use: Never used  Substance and Sexual Activity   Alcohol use: No   Drug use: Never   Sexual activity: Not on file  Other Topics Concern   Not on file  Social History Narrative   Not on file   Social Determinants of Health   Financial Resource Strain: Not on file  Food Insecurity: No Food Insecurity (08/23/2022)   Hunger Vital Sign    Worried About Running Out of Food in the Last Year: Never true    Ran Out of Food in the Last Year: Never true  Transportation Needs: No Transportation Needs (08/23/2022)   PRAPARE - Administrator, Civil Service (Medical): No    Lack of Transportation (Non-Medical): No  Physical Activity: Not on file  Stress: Not on file  Social Connections: Not on file  Intimate Partner Violence: Not At Risk (08/23/2022)   Humiliation, Afraid, Rape, and Kick  questionnaire    Fear of Current or Ex-Partner: No    Emotionally Abused: No    Physically Abused: No    Sexually Abused: No    Family History:    Family History  Problem Relation Age of Onset   Lung disease Mother    Heart attack Father    Heart disease Father      ROS:  Please see the history of present illness.   All other ROS reviewed and negative.     Physical Exam/Data:   Vitals:   10/19/22 1530 10/19/22  1600 10/19/22 1645 10/19/22 1726  BP: (!) 147/94 131/70 (!) 144/68   Pulse: (!) 32 (!) 132 (!) 55   Resp: 17 (!) 28 (!) 26   Temp:    98.3 F (36.8 C)  TempSrc:    Oral  SpO2: 100% 99% 98%   Weight:      Height:       No intake or output data in the 24 hours ending 10/19/22 1752    10/19/2022    1:25 PM 10/19/2022   12:00 PM 08/23/2022    9:00 AM  Last 3 Weights  Weight (lbs) 182 lb 15.7 oz 184 lb 11.9 oz 184 lb 11.9 oz  Weight (kg) 83 kg 83.8 kg 83.8 kg     Body mass index is 28.66 kg/m.  General:  Well nourished, well developed, in no acute distress HEENT: normal Lymph: no adenopathy Neck: no JVD Endocrine:  No thryomegaly Vascular: No carotid bruits; FA pulses 2+ bilaterally without bruits  Cardiac:  normal S1, S2; RRR; no murmur  Lungs:  clear to auscultation bilaterally, no wheezing, rhonchi or rales  Abd: soft, nontender, no hepatomegaly  Ext: no edema Musculoskeletal:  No deformities, BUE and BLE strength normal and equal Skin: warm and dry  Neuro:  CNs 2-12 intact, no focal abnormalities noted Psych:  Normal affect   EKG:  The EKG was personally reviewed and demonstrates:  no EKG to review Telemetry:  Telemetry was personally reviewed and demonstrates:  atrial fibrillation with RVr  Laboratory Data:  High Sensitivity Troponin:  No results for input(s): "TROPONINIHS" in the last 720 hours.   Chemistry Recent Labs  Lab 10/19/22 1300  NA 134*  138  K 4.3  4.2  CL 101  104  CO2 21*  GLUCOSE 113*  112*  BUN 24*  26*  CREATININE  1.41*  1.40*  CALCIUM 9.6  GFRNONAA 54*  ANIONGAP 12    Recent Labs  Lab 10/19/22 1300  PROT 7.9  ALBUMIN 4.3  AST 37  ALT 37  ALKPHOS 78  BILITOT 0.9   Hematology Recent Labs  Lab 10/19/22 1300  WBC 12.5*  RBC 5.78  HGB 16.3  17.7*  HCT 50.3  52.0  MCV 87.0  MCH 28.2  MCHC 32.4  RDW 14.3  PLT 268   BNPNo results for input(s): "BNP", "PROBNP" in the last 168 hours.  DDimer No results for input(s): "DDIMER" in the last 168 hours.   Radiology/Studies:  CT Angio Chest PE W and/or Wo Contrast  Result Date: 10/19/2022 CLINICAL DATA:  History of prostate cancer. EXAM: CT ANGIOGRAPHY CHEST WITH CONTRAST TECHNIQUE: Multidetector CT imaging of the chest was performed using the standard protocol during bolus administration of intravenous contrast. Multiplanar CT image reconstructions and MIPs were obtained to evaluate the vascular anatomy. RADIATION DOSE REDUCTION: This exam was performed according to the departmental dose-optimization program which includes automated exposure control, adjustment of the mA and/or kV according to patient size and/or use of iterative reconstruction technique. CONTRAST:  75mL OMNIPAQUE IOHEXOL 350 MG/ML SOLN COMPARISON:  None Available. FINDINGS: Cardiovascular: Significant breathing motion seen throughout the examination. This limits evaluation for pulmonary emboli. Nondiagnostic for small and peripheral emboli. No large or central embolus. Normal caliber thoracic aorta with mild calcified plaque. Coronary artery calcifications are seen. The heart is nonenlarged. No pericardial effusion. Mediastinum/Nodes: Slightly patulous thoracic esophagus. No specific abnormal lymph node enlargement seen in the axillary region, hilum or mediastinum. There are some small lymph nodes identified in the  right lung hilum which are less than a cm in short axis and not pathologic by size criteria. Lungs/Pleura: Linear opacity lung bases likely scar or atelectasis. No  consolidation, pneumothorax or effusion. Upper Abdomen: Along the upper abdomen the adrenal glands are preserved. There is some high density noted within the left kidney. Please correlate with prior CT angiogram of the head from same day Musculoskeletal: Diffuse degenerative changes of the spine. There is multilevel Schmorl's node changes with bridging osteophytes and syndesmophytes. Mild compression noted along the lower thoracic spine vertebral levels. Review of the MIP images confirms the above findings. IMPRESSION: Extensive breathing motion.  No large or central embolus. Aortic Atherosclerosis (ICD10-I70.0). Electronically Signed   By: Karen Kays M.D.   On: 10/19/2022 17:25   CT HEAD CODE STROKE WO CONTRAST  Result Date: 10/19/2022 CLINICAL DATA:  Code stroke. New onset dizziness and intermittent aphasia, resolved. EXAM: CT ANGIOGRAPHY HEAD AND NECK TECHNIQUE: Multidetector CT imaging of the head and neck was performed using the standard protocol during bolus administration of intravenous contrast. Multiplanar CT image reconstructions and MIPs were obtained to evaluate the vascular anatomy. Carotid stenosis measurements (when applicable) are obtained utilizing NASCET criteria, using the distal internal carotid diameter as the denominator. RADIATION DOSE REDUCTION: This exam was performed according to the departmental dose-optimization program which includes automated exposure control, adjustment of the mA and/or kV according to patient size and/or use of iterative reconstruction technique. CONTRAST:  75mL OMNIPAQUE IOHEXOL 350 MG/ML SOLN COMPARISON:  None Available. FINDINGS: CT HEAD FINDINGS Brain: There is no evidence of acute intracranial hemorrhage or extra-axial fluid collection. There is no evolved large vessel territorial infarct. There is hypodensity in the left corona radiata best seen in the coronal sequence which could reflect an acute infarct in the left MCA distribution (4-32). ASPECTS is 9  Background parenchymal volume is normal. The ventricles are normal in size. Additional hypodensity in the supratentorial white matter is nonspecific but likely reflects sequela of underlying chronic small-vessel ischemic change. The pituitary and suprasellar region are normal. There is no mass lesion. There is no mass effect or midline shift. Vascular: There is calcification of the bilateral carotid siphons. Skull: Normal. Negative for fracture or focal lesion. Sinuses/Orbits: The paranasal sinuses are clear. The globes and orbits are unremarkable. Other: None. Review of the MIP images confirms the above findings CTA NECK FINDINGS Aortic arch: The imaged aortic arch is normal. The origins of the major branch vessels are patent. The subclavian arteries are patent to the level imaged. Right carotid system: The right common carotid artery is patent. There is mixed plaque of the bifurcation resulting in approximately 30-40% stenosis. The distal internal carotid artery is patent. The external carotid artery is patent. There is no evidence of dissection or aneurysm. Left carotid system: The left common carotid artery is patent. There is mild atherosclerotic irregularity at the bifurcation without significant stenosis or occlusion. The distal internal carotid artery is patent. The external carotid artery is patent. There is no evidence of dissection or aneurysm. Vertebral arteries: Plaque of the right vertebral artery origin resulting in moderate stenosis. The vertebral arteries are otherwise patent, without hemodynamically significant stenosis or occlusion. There is no evidence of dissection or aneurysm. Skeleton: There is no acute osseous abnormality or suspicious osseous lesion. Other neck: The soft tissues of the neck are unremarkable. Upper chest: The imaged lung apices are clear. Review of the MIP images confirms the above findings CTA HEAD FINDINGS Anterior circulation: There is calcified plaque  in the intracranial  ICAs without greater than mild stenosis. The bilateral MCAs are patent, without proximal stenosis or occlusion. The bilateral ACAs are patent, without proximal stenosis or occlusion. The anterior communicating artery is normal. There is no aneurysm or AVM. Posterior circulation: The bilateral V4 segments are patent. The basilar artery is patent. The major cerebellar arteries appear patent. The bilateral PCAs are patent, without proximal stenosis or occlusion. A small left posterior communicating artery is identified. There is no aneurysm or AVM. Venous sinuses: As permitted by contrast timing, patent. Anatomic variants: None. Review of the MIP images confirms the above findings IMPRESSION: 1. Possible acute to subacute infarct in the left corona radiata. Consider MRI for further evaluation. 2. No emergent vascular finding. 3. 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 Mild atherosclerotic irregularity in the left without significant stenosis. 4. 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 5. Patent intracranial vasculature. Findings communicated to Dr. Wilford Corner at 1:15 pm. Electronically Signed   By: Lesia Hausen M.D.   On: 10/19/2022 13:21   CT ANGIO HEAD NECK W WO CM (CODE STROKE)  Result Date: 10/19/2022 CLINICAL DATA:  Code stroke. New onset dizziness and intermittent aphasia, resolved. EXAM: CT ANGIOGRAPHY HEAD AND NECK TECHNIQUE: Multidetector CT imaging of the head and neck was performed using the standard protocol during bolus administration of intravenous contrast. Multiplanar CT image reconstructions and MIPs were obtained to evaluate the vascular anatomy. Carotid stenosis measurements (when applicable) are obtained utilizing NASCET criteria, using the distal internal carotid diameter as the denominator. RADIATION DOSE REDUCTION: This exam was performed according to the departmental dose-optimization program which includes automated exposure  control, adjustment of the mA and/or kV according to patient size and/or use of iterative reconstruction technique. CONTRAST:  75mL OMNIPAQUE IOHEXOL 350 MG/ML SOLN COMPARISON:  None Available. FINDINGS: CT HEAD FINDINGS Brain: There is no evidence of acute intracranial hemorrhage or extra-axial fluid collection. There is no evolved large vessel territorial infarct. There is hypodensity in the left corona radiata best seen in the coronal sequence which could reflect an acute infarct in the left MCA distribution (4-32). ASPECTS is 9 Background parenchymal volume is normal. The ventricles are normal in size. Additional hypodensity in the supratentorial white matter is nonspecific but likely reflects sequela of underlying chronic small-vessel ischemic change. The pituitary and suprasellar region are normal. There is no mass lesion. There is no mass effect or midline shift. Vascular: There is calcification of the bilateral carotid siphons. Skull: Normal. Negative for fracture or focal lesion. Sinuses/Orbits: The paranasal sinuses are clear. The globes and orbits are unremarkable. Other: None. Review of the MIP images confirms the above findings CTA NECK FINDINGS Aortic arch: The imaged aortic arch is normal. The origins of the major branch vessels are patent. The subclavian arteries are patent to the level imaged. Right carotid system: The right common carotid artery is patent. There is mixed plaque of the bifurcation resulting in approximately 30-40% stenosis. The distal internal carotid artery is patent. The external carotid artery is patent. There is no evidence of dissection or aneurysm. Left carotid system: The left common carotid artery is patent. There is mild atherosclerotic irregularity at the bifurcation without significant stenosis or occlusion. The distal internal carotid artery is patent. The external carotid artery is patent. There is no evidence of dissection or aneurysm. Vertebral arteries: Plaque of the  right vertebral artery origin resulting in moderate stenosis. The vertebral arteries are otherwise patent, without hemodynamically significant  stenosis or occlusion. There is no evidence of dissection or aneurysm. Skeleton: There is no acute osseous abnormality or suspicious osseous lesion. Other neck: The soft tissues of the neck are unremarkable. Upper chest: The imaged lung apices are clear. Review of the MIP images confirms the above findings CTA HEAD FINDINGS Anterior circulation: There is calcified plaque in the intracranial ICAs without greater than mild stenosis. The bilateral MCAs are patent, without proximal stenosis or occlusion. The bilateral ACAs are patent, without proximal stenosis or occlusion. The anterior communicating artery is normal. There is no aneurysm or AVM. Posterior circulation: The bilateral V4 segments are patent. The basilar artery is patent. The major cerebellar arteries appear patent. The bilateral PCAs are patent, without proximal stenosis or occlusion. A small left posterior communicating artery is identified. There is no aneurysm or AVM. Venous sinuses: As permitted by contrast timing, patent. Anatomic variants: None. Review of the MIP images confirms the above findings IMPRESSION: 1. Possible acute to subacute infarct in the left corona radiata. Consider MRI for further evaluation. 2. No emergent vascular finding. 3. 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 Mild atherosclerotic irregularity in the left without significant stenosis. 4. 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 5. Patent intracranial vasculature. Findings communicated to Dr. Wilford Corner at 1:15 pm. Electronically Signed   By: Lesia Hausen M.D.   On: 10/19/2022 13:21     Assessment and Plan:   New Onset Atrial Fibrillation with RVR Unknown duration -patient had no palpitations but complained of onset of CP while out for a walk and dizziness -found to  be in afib with RVR in ER -no on IV heparin gtt and IV Cardizem -CHADS2VASC score 5 (age>65,HTN,PAD,CVA) -recommend transitioning to DOAC when ok per Neuro -2D echo pending -check TSH -K+ 4.3 -check Mag -recommend outpt sleep study  2.  Chest pain -likely related to demand ischemia in the setting of afib with RVR -chest CTA with coronary artery calcifications -check hsTrop -continue IV Heparin gtt for now -2D echo pending to define coronary anatomy -will need ischemic workup at some point>>not candidate for cath due to acute CVA.  -consider outpatient Stress PET CT to assess for ischemia -check FLP and HbA1C in am -started on Atorvastatin 80mg  daily>>was on Crestor 20mg  daily at home -will need FLP and ALT in 6 weeks  3.  HTN -BP elevated at 163/41mmHg on arrival -Now on IV Cardizem gtt -Neuro wants permissive HTN due to acute CVA  4.  Carotid artery stenosis -s/p left CEA 9/23 -CT of head and neck today with 30-40% right carotid bifurcation stenosis and mild atherosclerosis of the left, moderate stenosis of right vertebral artery origin -continue statin -he has not been on ASA due to rectal bleeding  5.  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     CHA2DS2-VASc Score = 5   This indicates a 7.2% annual risk of stroke. The patient's score is based upon: CHF History: 0 HTN History: 1 Diabetes History: 0 Stroke History: 2 Vascular Disease History: 1 Age Score: 1 Gender Score: 0         For questions  or updates, please contact [facility] Please consult [email or web address] for contact info under    Signed, Armanda Magic, MD   10/19/2022 5:52 PM

## 2022-10-19 NOTE — Code Documentation (Addendum)
Stroke Response Nurse Documentation Code Documentation  Peter Roberts is a 72 y.o. male arriving to [facility]  via [facility] on 10/19/2022 with past medical hx of CKD, Carotid Stenosis, Prostate Cancer.  Code stroke was activated by EMS.   Patient from home where he was LKW at 1000 when he left for a walk. On his way back, he reported having chest discomfort and profuse sweating.After he arrived to his house, he noted dizziness and lightheadedness where he had to sit down. He called EMS for chest discomfort. While in transport, pt was noted to be in a new onset of Afib with intermittent speech difficulty and now complaining of very mild speech difficulty.  Stroke team at the bedside on patient arrival. Labs drawn and patient cleared for CT.  Patient to CT with team. NIHSS 1, see documentation for details and code stroke times. Patient with Expressive aphasia  on exam - Very very mild. The following imaging was completed:  CT Head and CTA. Patient is not a candidate for IV Thrombolytic due to too mild to treat. Patient is not a candidate for IR due to No LVO.   Care Plan: q30 NIHSS/VS until 1430, then q2 NIHSS/VS for 12 hours.   Bedside handoff with ED RN Mollie Germany, Ramon Dredge  Stroke Response RN

## 2022-10-19 NOTE — ED Notes (Signed)
Floor attempted to be called no answer, pt transported to floor via nurse

## 2022-10-19 NOTE — ED Notes (Signed)
ED TO INPATIENT HANDOFF REPORT  ED Nurse Name and Phone #: [phone]  S Name/Age/Gender Peter Roberts 72 y.o. male Room/Bed: 011C/011C  Code Status   Code Status: Full Code  Home/SNF/Other Home Patient oriented to: self, place, time, and situation Is this baseline? Yes   Triage Complete: Triage complete  Chief Complaint Acute ischemic stroke [facility]) [I63.9]  Triage Note Pt to the ed from home with a CC of dizziness and  aphasia per ems. Pt relay she was feeling completely normal at 10 am. Pt relays he has a slight chest pressure and headache. Per ems pt was in a new onset of a fib with RVR. Pt denies and LOC, weakness or any other complaints at this time.    Allergies Allergies  Allergen Reactions   Sulfa Antibiotics Hives    Level of Care/Admitting Diagnosis ED Disposition     ED Disposition  Admit   Condition  --   Comment  Hospital Area: [facility] [[id number]]  Level of Care: Progressive [102]  Admit to Progressive based on following criteria: CARDIOVASCULAR & THORACIC of moderate stability with acute coronary syndrome symptoms/low risk myocardial infarction/hypertensive urgency/arrhythmias/heart failure potentially compromising stability and stable post cardiovascular intervention patients.  May admit patient to [facility] or [facility] if equivalent level of care is available:: No  Covid Evaluation: Asymptomatic - no recent exposure (last 10 days) testing not required  Diagnosis: Acute ischemic stroke [facility]) [[id number]]  Admitting Physician: Hughie Closs [[id number]]  Attending Physician: Hughie Closs [phone]  Certification:: I certify this patient will need inpatient services for at least 2 midnights  Estimated Length of Stay: 2          B Medical/Surgery History Past Medical History:  Diagnosis Date   Arthritis    BPH associated with nocturia    Carotid stenosis, asymptomatic, bilateral 2017   followed by pcp and vascular dr Chestine Spore;    s/p  left CEA 02-25-2022 for stenosis >80%; (post op note in epic 03-17-2022 healing well)   last duplex in epic 02-03-2022 right ICA 40-59% and left ICA 80-99%   Elevated blood pressure reading without diagnosis of hypertension    Family history of prostate cancer    History of anal fissures    Hyperlipidemia    Malignant neoplasm prostate (HCC) 04/2022   urologist--- dr newsome/  radiation oncologist--- dr Kathrynn Running;  dx 11/ 2023,  Gleason 4+3   Microcytic anemia    Past Surgical History:  Procedure Laterality Date   CARPAL TUNNEL RELEASE Left 12/2021   COLONOSCOPY  2019   ENDARTERECTOMY Left 02/25/2022   Procedure: LEFT CAROTID ENDARTERECTOMY;  Surgeon: Cephus Shelling, MD;  Location: [facility] OR;  Service: Vascular;  Laterality: Left;   KNEE ARTHROSCOPY Right 2002     A IV Location/Drains/Wounds Patient Lines/Drains/Airways Status     Active Line/Drains/Airways     Name Placement date Placement time Site Days   Peripheral IV 10/19/22 20 G Right Antecubital 10/19/22  1300  Antecubital  less than 1   Peripheral IV 10/19/22 20 G Left Antecubital 10/19/22  1717  Antecubital  less than 1            Intake/Output Last 24 hours No intake or output data in the 24 hours ending 10/19/22 1754  Labs/Imaging Results for orders placed or performed during the hospital encounter of 10/19/22 (from the past 48 hour(s))  CBG monitoring, ED     Status: None   Collection Time: 10/19/22 12:53 PM  Result Value Ref Range   Glucose-Capillary 98 70 - 99 mg/dL    Comment: Glucose reference range applies only to samples taken after fasting for at least 8 hours.  Protime-INR     Status: None   Collection Time: 10/19/22  1:00 PM  Result Value Ref Range   Prothrombin Time 14.2 11.4 - 15.2 seconds   INR 1.1 0.8 - 1.2    Comment: (NOTE) INR goal varies based on device and disease states. Performed at [facility], [address], [place], [place] [address]   APTT     Status: None   Collection  Time: 10/19/22  1:00 PM  Result Value Ref Range   aPTT 31 24 - 36 seconds    Comment: Performed at [facility], [address] [place], [place] [address]  CBC     Status: Abnormal   Collection Time: 10/19/22  1:00 PM  Result Value Ref Range   WBC 12.5 (H) 4.0 - 10.5 K/uL   RBC 5.78 4.22 - 5.81 MIL/uL   Hemoglobin 16.3 13.0 - 17.0 g/dL   HCT 69.6 29.5 - 28.4 %   MCV 87.0 80.0 - 100.0 fL   MCH 28.2 26.0 - 34.0 pg   MCHC 32.4 30.0 - 36.0 g/dL   RDW 13.2 44.0 - 10.2 %   Platelets 268 150 - 400 K/uL   nRBC 0.0 0.0 - 0.2 %    Comment: Performed at [facility], [address] [place], [place] [address]  Differential     Status: Abnormal   Collection Time: 10/19/22  1:00 PM  Result Value Ref Range   Neutrophils Relative % 80 %   Neutro Abs 10.1 (H) 1.7 - 7.7 K/uL   Lymphocytes Relative 12 %   Lymphs Abs 1.5 0.7 - 4.0 K/uL   Monocytes Relative 6 %   Monocytes Absolute 0.7 0.1 - 1.0 K/uL   Eosinophils Relative 1 %   Eosinophils Absolute 0.1 0.0 - 0.5 K/uL   Basophils Relative 1 %   Basophils Absolute 0.1 0.0 - 0.1 K/uL   Immature Granulocytes 0 %   Abs Immature Granulocytes 0.03 0.00 - 0.07 K/uL    Comment: Performed at [facility], [address], [place], [place] [address]  Comprehensive metabolic panel     Status: Abnormal   Collection Time: 10/19/22  1:00 PM  Result Value Ref Range   Sodium 134 (L) 135 - 145 mmol/L   Potassium 4.3 3.5 - 5.1 mmol/L   Chloride 101 98 - 111 mmol/L   CO2 21 (L) 22 - 32 mmol/L   Glucose, Bld 113 (H) 70 - 99 mg/dL    Comment: Glucose reference range applies only to samples taken after fasting for at least 8 hours.   BUN 24 (H) 8 - 23 mg/dL   Creatinine, Ser 4.74 (H) 0.61 - 1.24 mg/dL   Calcium 9.6 8.9 - 25.9 mg/dL   Total Protein 7.9 6.5 - 8.1 g/dL   Albumin 4.3 3.5 - 5.0 g/dL   AST 37 15 - 41 U/L   ALT 37 0 - 44 U/L   Alkaline Phosphatase 78 38 - 126 U/L   Total Bilirubin 0.9 0.3 - 1.2 mg/dL   GFR, Estimated 54 (L) >60  mL/min    Comment: (NOTE) Calculated using the CKD-EPI Creatinine Equation (2021)    Anion gap 12 5 - 15    Comment: Performed at [facility], [address], [place], [place] [address]  Ethanol     Status: None   Collection Time: 10/19/22  1:00 PM  Result Value Ref Range   Alcohol, Ethyl (B) <10 <10 mg/dL    Comment: (NOTE) Lowest detectable limit for serum alcohol is 10 mg/dL.  For medical purposes only. Performed at [facility], [address], [place], [place] [address]   I-stat chem 8, ED     Status: Abnormal   Collection Time: 10/19/22  1:00 PM  Result Value Ref Range   Sodium 138 135 - 145 mmol/L   Potassium 4.2 3.5 - 5.1 mmol/L   Chloride 104 98 - 111 mmol/L   BUN 26 (H) 8 - 23 mg/dL   Creatinine, Ser 8.29 (H) 0.61 - 1.24 mg/dL   Glucose, Bld 562 (H) 70 - 99 mg/dL    Comment: Glucose reference range applies only to samples taken after fasting for at least 8 hours.   Calcium, Ion 1.15 1.15 - 1.40 mmol/L   TCO2 22 22 - 32 mmol/L   Hemoglobin 17.7 (H) 13.0 - 17.0 g/dL   HCT 13.0 86.5 - 78.4 %   CT Angio Chest PE W and/or Wo Contrast  Result Date: 10/19/2022 CLINICAL DATA:  History of prostate cancer. EXAM: CT ANGIOGRAPHY CHEST WITH CONTRAST TECHNIQUE: Multidetector CT imaging of the chest was performed using the standard protocol during bolus administration of intravenous contrast. Multiplanar CT image reconstructions and MIPs were obtained to evaluate the vascular anatomy. RADIATION DOSE REDUCTION: This exam was performed according to the departmental dose-optimization program which includes automated exposure control, adjustment of the mA and/or kV according to patient size and/or use of iterative reconstruction technique. CONTRAST:  75mL OMNIPAQUE IOHEXOL 350 MG/ML SOLN COMPARISON:  None Available. FINDINGS: Cardiovascular: Significant breathing motion seen throughout the examination. This limits evaluation for pulmonary emboli. Nondiagnostic for small and  peripheral emboli. No large or central embolus. Normal caliber thoracic aorta with mild calcified plaque. Coronary artery calcifications are seen. The heart is nonenlarged. No pericardial effusion. Mediastinum/Nodes: Slightly patulous thoracic esophagus. No specific abnormal lymph node enlargement seen in the axillary region, hilum or mediastinum. There are some small lymph nodes identified in the right lung hilum which are less than a cm in short axis and not pathologic by size criteria. Lungs/Pleura: Linear opacity lung bases likely scar or atelectasis. No consolidation, pneumothorax or effusion. Upper Abdomen: Along the upper abdomen the adrenal glands are preserved. There is some high density noted within the left kidney. Please correlate with prior CT angiogram of the head from same day Musculoskeletal: Diffuse degenerative changes of the spine. There is multilevel Schmorl's node changes with bridging osteophytes and syndesmophytes. Mild compression noted along the lower thoracic spine vertebral levels. Review of the MIP images confirms the above findings. IMPRESSION: Extensive breathing motion.  No large or central embolus. Aortic Atherosclerosis (ICD10-I70.0). Electronically Signed   By: Karen Kays M.D.   On: 10/19/2022 17:25   CT HEAD CODE STROKE WO CONTRAST  Result Date: 10/19/2022 CLINICAL DATA:  Code stroke. New onset dizziness and intermittent aphasia, resolved. EXAM: CT ANGIOGRAPHY HEAD AND NECK TECHNIQUE: Multidetector CT imaging of the head and neck was performed using the standard protocol during bolus administration of intravenous contrast. Multiplanar CT image reconstructions and MIPs were obtained to evaluate the vascular anatomy. Carotid stenosis measurements (when applicable) are obtained utilizing NASCET criteria, using the distal internal carotid diameter as the denominator. RADIATION DOSE REDUCTION: This exam was performed according to the departmental dose-optimization  program which  includes automated exposure control, adjustment of the mA and/or kV according to patient size and/or use of iterative reconstruction technique. CONTRAST:  75mL OMNIPAQUE IOHEXOL 350 MG/ML SOLN COMPARISON:  None Available. FINDINGS: CT HEAD FINDINGS Brain: There is no evidence of acute intracranial hemorrhage or extra-axial fluid collection. There is no evolved large vessel territorial infarct. There is hypodensity in the left corona radiata best seen in the coronal sequence which could reflect an acute infarct in the left MCA distribution (4-32). ASPECTS is 9 Background parenchymal volume is normal. The ventricles are normal in size. Additional hypodensity in the supratentorial white matter is nonspecific but likely reflects sequela of underlying chronic small-vessel ischemic change. The pituitary and suprasellar region are normal. There is no mass lesion. There is no mass effect or midline shift. Vascular: There is calcification of the bilateral carotid siphons. Skull: Normal. Negative for fracture or focal lesion. Sinuses/Orbits: The paranasal sinuses are clear. The globes and orbits are unremarkable. Other: None. Review of the MIP images confirms the above findings CTA NECK FINDINGS Aortic arch: The imaged aortic arch is normal. The origins of the major branch vessels are patent. The subclavian arteries are patent to the level imaged. Right carotid system: The right common carotid artery is patent. There is mixed plaque of the bifurcation resulting in approximately 30-40% stenosis. The distal internal carotid artery is patent. The external carotid artery is patent. There is no evidence of dissection or aneurysm. Left carotid system: The left common carotid artery is patent. There is mild atherosclerotic irregularity at the bifurcation without significant stenosis or occlusion. The distal internal carotid artery is patent. The external carotid artery is patent. There is no evidence of dissection or aneurysm.  Vertebral arteries: Plaque of the right vertebral artery origin resulting in moderate stenosis. The vertebral arteries are otherwise patent, without hemodynamically significant stenosis or occlusion. There is no evidence of dissection or aneurysm. Skeleton: There is no acute osseous abnormality or suspicious osseous lesion. Other neck: The soft tissues of the neck are unremarkable. Upper chest: The imaged lung apices are clear. Review of the MIP images confirms the above findings CTA HEAD FINDINGS Anterior circulation: There is calcified plaque in the intracranial ICAs without greater than mild stenosis. The bilateral MCAs are patent, without proximal stenosis or occlusion. The bilateral ACAs are patent, without proximal stenosis or occlusion. The anterior communicating artery is normal. There is no aneurysm or AVM. Posterior circulation: The bilateral V4 segments are patent. The basilar artery is patent. The major cerebellar arteries appear patent. The bilateral PCAs are patent, without proximal stenosis or occlusion. A small left posterior communicating artery is identified. There is no aneurysm or AVM. Venous sinuses: As permitted by contrast timing, patent. Anatomic variants: None. Review of the MIP images confirms the above findings IMPRESSION: 1. Possible acute to subacute infarct in the left corona radiata. Consider MRI for further evaluation. 2. No emergent vascular finding. 3. 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 Mild atherosclerotic irregularity in the left without significant stenosis. 4. 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 5. Patent intracranial vasculature. Findings communicated to Dr. Wilford Corner at 1:15 pm. Electronically Signed   By: Lesia Hausen M.D.   On: 10/19/2022 13:21   CT ANGIO HEAD NECK W WO CM (CODE STROKE)  Result Date: 10/19/2022 CLINICAL DATA:  Code stroke. New onset dizziness and intermittent aphasia, resolved.  EXAM: CT ANGIOGRAPHY HEAD AND NECK TECHNIQUE: Multidetector CT imaging of the head  and neck was performed using the standard protocol during bolus administration of intravenous contrast. Multiplanar CT image reconstructions and MIPs were obtained to evaluate the vascular anatomy. Carotid stenosis measurements (when applicable) are obtained utilizing NASCET criteria, using the distal internal carotid diameter as the denominator. RADIATION DOSE REDUCTION: This exam was performed according to the departmental dose-optimization program which includes automated exposure control, adjustment of the mA and/or kV according to patient size and/or use of iterative reconstruction technique. CONTRAST:  75mL OMNIPAQUE IOHEXOL 350 MG/ML SOLN COMPARISON:  None Available. FINDINGS: CT HEAD FINDINGS Brain: There is no evidence of acute intracranial hemorrhage or extra-axial fluid collection. There is no evolved large vessel territorial infarct. There is hypodensity in the left corona radiata best seen in the coronal sequence which could reflect an acute infarct in the left MCA distribution (4-32). ASPECTS is 9 Background parenchymal volume is normal. The ventricles are normal in size. Additional hypodensity in the supratentorial white matter is nonspecific but likely reflects sequela of underlying chronic small-vessel ischemic change. The pituitary and suprasellar region are normal. There is no mass lesion. There is no mass effect or midline shift. Vascular: There is calcification of the bilateral carotid siphons. Skull: Normal. Negative for fracture or focal lesion. Sinuses/Orbits: The paranasal sinuses are clear. The globes and orbits are unremarkable. Other: None. Review of the MIP images confirms the above findings CTA NECK FINDINGS Aortic arch: The imaged aortic arch is normal. The origins of the major branch vessels are patent. The subclavian arteries are patent to the level imaged. Right carotid system: The right common  carotid artery is patent. There is mixed plaque of the bifurcation resulting in approximately 30-40% stenosis. The distal internal carotid artery is patent. The external carotid artery is patent. There is no evidence of dissection or aneurysm. Left carotid system: The left common carotid artery is patent. There is mild atherosclerotic irregularity at the bifurcation without significant stenosis or occlusion. The distal internal carotid artery is patent. The external carotid artery is patent. There is no evidence of dissection or aneurysm. Vertebral arteries: Plaque of the right vertebral artery origin resulting in moderate stenosis. The vertebral arteries are otherwise patent, without hemodynamically significant stenosis or occlusion. There is no evidence of dissection or aneurysm. Skeleton: There is no acute osseous abnormality or suspicious osseous lesion. Other neck: The soft tissues of the neck are unremarkable. Upper chest: The imaged lung apices are clear. Review of the MIP images confirms the above findings CTA HEAD FINDINGS Anterior circulation: There is calcified plaque in the intracranial ICAs without greater than mild stenosis. The bilateral MCAs are patent, without proximal stenosis or occlusion. The bilateral ACAs are patent, without proximal stenosis or occlusion. The anterior communicating artery is normal. There is no aneurysm or AVM. Posterior circulation: The bilateral V4 segments are patent. The basilar artery is patent. The major cerebellar arteries appear patent. The bilateral PCAs are patent, without proximal stenosis or occlusion. A small left posterior communicating artery is identified. There is no aneurysm or AVM. Venous sinuses: As permitted by contrast timing, patent. Anatomic variants: None. Review of the MIP images confirms the above findings IMPRESSION: 1. Possible acute to subacute infarct in the left corona radiata. Consider MRI for further evaluation. 2. No emergent vascular  finding. 3. 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 Mild atherosclerotic irregularity in the left without significant stenosis. 4. 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 5. Patent intracranial vasculature. Findings communicated to Dr. Wilford Corner  at 1:15 pm. Electronically Signed   By: Lesia Hausen M.D.   On: 10/19/2022 13:21    Pending Labs Unresulted Labs (From admission, onward)     Start     Ordered   10/20/22 0500  Heparin level (unfractionated)  Daily,   R      10/19/22 1529   10/20/22 0500  Lipid panel  (Labs)  Tomorrow morning,   R       Comments: Fasting    10/19/22 1651   10/20/22 0000  Heparin level (unfractionated)  Once-Timed,   URGENT        10/19/22 1529   10/19/22 1650  Hemoglobin A1c  (Labs)  Once,   R       Comments: To assess prior glycemic control    10/19/22 1651   10/19/22 1349  Magnesium  Add-on,   AD        10/19/22 1348            Vitals/Pain Today's Vitals   10/19/22 1530 10/19/22 1600 10/19/22 1645 10/19/22 1726  BP: (!) 147/94 131/70 (!) 144/68   Pulse: (!) 32 (!) 132 (!) 55   Resp: 17 (!) 28 (!) 26   Temp:    98.3 F (36.8 C)  TempSrc:    Oral  SpO2: 100% 99% 98%   Weight:      Height:      PainSc:        Isolation Precautions No active isolations  Medications Medications  sodium chloride flush (NS) 0.9 % injection 3 mL (3 mLs Intravenous Not Given 10/19/22 1329)  heparin ADULT infusion 100 units/mL (25000 units/254mL) (1,000 Units/hr Intravenous New Bag/Given 10/19/22 1544)  diltiazem (CARDIZEM) 125 mg in dextrose 5% 125 mL (1 mg/mL) infusion (5 mg/hr Intravenous New Bag/Given 10/19/22 1728)  rosuvastatin (CRESTOR) tablet 20 mg (has no administration in time range)  docusate sodium (COLACE) capsule 100 mg (has no administration in time range)   stroke: early stages of recovery book (has no administration in time range)  0.9 %  sodium chloride infusion (has no administration  in time range)  acetaminophen (TYLENOL) tablet 650 mg (has no administration in time range)    Or  acetaminophen (TYLENOL) 160 MG/5ML solution 650 mg (has no administration in time range)    Or  acetaminophen (TYLENOL) suppository 650 mg (has no administration in time range)  iohexol (OMNIPAQUE) 350 MG/ML injection 75 mL (75 mLs Intravenous Contrast Given 10/19/22 1310)  metoprolol tartrate (LOPRESSOR) injection 2.5 mg (2.5 mg Intravenous Given 10/19/22 1621)  iohexol (OMNIPAQUE) 350 MG/ML injection 75 mL (75 mLs Intravenous Contrast Given 10/19/22 1709)    Mobility walks     Focused Assessments Neuro Assessment Handoff:  Swallow screen pass? Yes  Cardiac Rhythm: Atrial fibrillation NIH Stroke Scale  Dizziness Present: No Headache Present: Yes Interval: Other (Comment) Level of Consciousness (1a.)   : Alert, keenly responsive LOC Questions (1b. )   : Answers both questions correctly LOC Commands (1c. )   : Performs both tasks correctly Best Gaze (2. )  : Normal Visual (3. )  : No visual loss Facial Palsy (4. )    : Normal symmetrical movements Motor Arm, Left (5a. )   : No drift Motor Arm, Right (5b. ) : No drift Motor Leg, Left (6a. )  : No drift Motor Leg, Right (6b. ) : No drift Limb Ataxia (7. ): Absent Sensory (8. )  : Normal, no sensory loss Best Language (9. )  :  No aphasia Dysarthria (10. ): Normal Extinction/Inattention (11.)   : No Abnormality Complete NIHSS TOTAL: 0 Last date known well: 10/19/22 Last time known well: 1000 Neuro Assessment: Exceptions to WDL (dizziness, heaache,) Neuro Checks:   Initial (10/19/22 1300)  Has TPA been given? No If patient is a Neuro Trauma and patient is going to OR before floor call report to 4N Charge nurse: [phone] or [phone]   R Recommendations: See Admitting Provider Note  Report given to:   Additional Notes:

## 2022-10-19 NOTE — ED Notes (Signed)
CBG 98  

## 2022-10-19 NOTE — Progress Notes (Signed)
Pt back to room from MRI.

## 2022-10-20 ENCOUNTER — Inpatient Hospital Stay (HOSPITAL_COMMUNITY)
Admit: 2022-10-20 | Discharge: 2022-10-20 | Disposition: A | Discharge status: Home / Self Care | Payer: Medicare Other | Attending: Cardiology | Admitting: Cardiology

## 2022-10-20 ENCOUNTER — Other Ambulatory Visit: Payer: Self-pay | Admitting: Cardiology

## 2022-10-20 ENCOUNTER — Telehealth (HOSPITAL_COMMUNITY): Payer: Self-pay | Admitting: Pharmacy Technician

## 2022-10-20 ENCOUNTER — Inpatient Hospital Stay (HOSPITAL_COMMUNITY): Payer: Medicare Other

## 2022-10-20 ENCOUNTER — Other Ambulatory Visit (HOSPITAL_COMMUNITY): Payer: Self-pay

## 2022-10-20 DIAGNOSIS — R931 Abnormal findings on diagnostic imaging of heart and coronary circulation: Secondary | ICD-10-CM | POA: Diagnosis not present

## 2022-10-20 DIAGNOSIS — I63421 Cerebral infarction due to embolism of right anterior cerebral artery: Secondary | ICD-10-CM | POA: Diagnosis not present

## 2022-10-20 DIAGNOSIS — I4891 Unspecified atrial fibrillation: Secondary | ICD-10-CM | POA: Diagnosis not present

## 2022-10-20 DIAGNOSIS — I6389 Other cerebral infarction: Secondary | ICD-10-CM | POA: Diagnosis not present

## 2022-10-20 DIAGNOSIS — R079 Chest pain, unspecified: Secondary | ICD-10-CM | POA: Diagnosis not present

## 2022-10-20 DIAGNOSIS — G459 Transient cerebral ischemic attack, unspecified: Secondary | ICD-10-CM | POA: Diagnosis not present

## 2022-10-20 DIAGNOSIS — I2510 Atherosclerotic heart disease of native coronary artery without angina pectoris: Secondary | ICD-10-CM | POA: Diagnosis not present

## 2022-10-20 LAB — RAPID URINE DRUG SCREEN, HOSP PERFORMED
Amphetamines: NOT DETECTED
Barbiturates: NOT DETECTED
Benzodiazepines: NOT DETECTED
Cocaine: NOT DETECTED
Opiates: NOT DETECTED
Tetrahydrocannabinol: NOT DETECTED

## 2022-10-20 LAB — LIPID PANEL
Cholesterol: 135 mg/dL (ref 0–200)
HDL: 54 mg/dL (ref >40)
LDL Cholesterol: 59 mg/dL (ref 0–99)
Total CHOL/HDL Ratio: 2.5 RATIO
Triglycerides: 108 mg/dL (ref <150)
VLDL: 22 mg/dL (ref 0–40)

## 2022-10-20 LAB — ECHOCARDIOGRAM COMPLETE
Area-P 1/2: 3.42 cm2
Height: 67 in
S' Lateral: 2.8 cm
Weight: 2927.71 oz

## 2022-10-20 LAB — HEPARIN LEVEL (UNFRACTIONATED)
Heparin Unfractionated: <0.1 IU/mL — ABNORMAL LOW (ref 0.30–0.70)
Heparin Unfractionated: 0.21 IU/mL — ABNORMAL LOW (ref 0.30–0.70)
Heparin Unfractionated: 0.52 IU/mL (ref 0.30–0.70)

## 2022-10-20 LAB — TROPONIN I (HIGH SENSITIVITY)
Troponin I (High Sensitivity): 140 ng/L (ref <18)
Troponin I (High Sensitivity): 126 ng/L (ref <18)

## 2022-10-20 MED ORDER — HEPARIN BOLUS VIA INFUSION
1000.0000 [IU] | Freq: Once | INTRAVENOUS | Status: AC
  Administered 2022-10-20: 1000 [IU] via INTRAVENOUS
  Filled 2022-10-20: qty 1000

## 2022-10-20 MED ORDER — IOHEXOL 350 MG/ML SOLN
95.0000 mL | Freq: Once | INTRAVENOUS | Status: AC | PRN
  Administered 2022-10-20: 95 mL via INTRAVENOUS

## 2022-10-20 MED ORDER — APIXABAN 5 MG PO TABS
5.0000 mg | ORAL_TABLET | Freq: Two times a day (BID) | ORAL | Status: DC
  Filled 2022-10-20: qty 1

## 2022-10-20 MED ORDER — HEPARIN (PORCINE) 25000 UT/250ML-% IV SOLN
1300.0000 [IU]/h | INTRAVENOUS | Status: AC
  Administered 2022-10-20 – 2022-10-21 (×2): 1350 [IU]/h via INTRAVENOUS
  Filled 2022-10-20 (×2): qty 250

## 2022-10-20 MED ORDER — NITROGLYCERIN 0.4 MG SL SUBL
SUBLINGUAL_TABLET | SUBLINGUAL | Status: AC
  Filled 2022-10-20: qty 2

## 2022-10-20 MED ORDER — PERFLUTREN LIPID MICROSPHERE
1.0000 mL | INTRAVENOUS | Status: AC | PRN
  Administered 2022-10-20: 2 mL via INTRAVENOUS

## 2022-10-20 NOTE — Progress Notes (Signed)
  Echocardiogram 2D Echocardiogram has been performed.  Janalyn Harder 10/20/2022, 11:02 AM

## 2022-10-20 NOTE — Progress Notes (Signed)
Progress Note  Patient Name: Peter Roberts Date of Encounter: 10/20/2022  [facility] Cardiologist: None   Patient Profile      Subjective   Patient admitted with chest discomfort, diaphoresis, acute stroke symptoms and found to be in A-fib with RVR on arrival in ER.  Converted to normal sinus bradycardia early this morning on IV Cardizem drip.  Inpatient Medications    Scheduled Meds:   stroke: early stages of recovery book   Does not apply Once   docusate sodium  100 mg Oral BID   rosuvastatin  20 mg Oral QPM   sodium chloride flush  3 mL Intravenous Once   Continuous Infusions:  sodium chloride 75 mL/hr at 10/20/22 0700   diltiazem (CARDIZEM) infusion 5 mg/hr (10/20/22 0730)   heparin 1,250 Units/hr (10/20/22 0700)   PRN Meds: acetaminophen **OR** acetaminophen (TYLENOL) oral liquid 160 mg/5 mL **OR** acetaminophen   Vital Signs    Vitals:   10/20/22 0021 10/20/22 0331 10/20/22 0527 10/20/22 0715  BP: 124/73 125/62 110/68 131/76  Pulse: (!) 105 61  (!) 58  Resp: 17 16  18   Temp: 97.6 F (36.4 C) (!) 97.4 F (36.3 C)  (!) 97.4 F (36.3 C)  TempSrc: Oral Oral  Oral  SpO2: 96% 97%  98%  Weight:      Height:        Intake/Output Summary (Last 24 hours) at 10/20/2022 0740 Last data filed at 10/20/2022 0700 Gross per 24 hour  Intake 1531.29 ml  Output 400 ml  Net 1131.29 ml      10/19/2022    1:25 PM 10/19/2022   12:00 PM 08/23/2022    9:00 AM  Last 3 Weights  Weight (lbs) 182 lb 15.7 oz 184 lb 11.9 oz 184 lb 11.9 oz  Weight (kg) 83 kg 83.8 kg 83.8 kg      Telemetry    Sinus bradycardia- Personally Reviewed  ECG    No new EKG to review- Personally Reviewed  Physical Exam   GEN: No acute distress.   Neck: No JVD Cardiac: RRR, no murmurs, rubs, or gallops.  Respiratory: Clear to auscultation bilaterally. GI: Soft, nontender, non-distended  MS: No edema; No deformity. Neuro:  Nonfocal  Psych: Normal affect   Labs    High Sensitivity  Troponin:  No results for input(s): "TROPONINIHS" in the last 720 hours.    Chemistry Recent Labs  Lab 10/19/22 1300  NA 134*  138  K 4.3  4.2  CL 101  104  CO2 21*  GLUCOSE 113*  112*  BUN 24*  26*  CREATININE 1.41*  1.40*  CALCIUM 9.6  PROT 7.9  ALBUMIN 4.3  AST 37  ALT 37  ALKPHOS 78  BILITOT 0.9  GFRNONAA 54*  ANIONGAP 12     Hematology Recent Labs  Lab 10/19/22 1300  WBC 12.5*  RBC 5.78  HGB 16.3  17.7*  HCT 50.3  52.0  MCV 87.0  MCH 28.2  MCHC 32.4  RDW 14.3  PLT 268    BNPNo results for input(s): "BNP", "PROBNP" in the last 168 hours.   DDimer No results for input(s): "DDIMER" in the last 168 hours.   CHA2DS2-VASc Score = 5   This indicates a 7.2% annual risk of stroke. The patient's score is based upon: CHF History: 0 HTN History: 1 Diabetes History: 0 Stroke History: 2 Vascular Disease History: 1 Age Score: 1 Gender Score: 0      Radiology  MR BRAIN WO CONTRAST  Result Date: 10/19/2022 CLINICAL DATA:  Transient ischemic attack. Neuro deficit, acute, stroke suspected. EXAM: MRI HEAD WITHOUT CONTRAST TECHNIQUE: Multiplanar, multiecho pulse sequences of the brain and surrounding structures were obtained without intravenous contrast. COMPARISON:  CTA head/neck 10/19/2022. FINDINGS: Brain: No acute infarct or hemorrhage. Moderate chronic small-vessel disease. No hydrocephalus or extra-axial collection. No foci of abnormal susceptibility. No mass or midline shift. Vascular: Normal flow voids. Skull and upper cervical spine: Normal marrow signal. Sinuses/Orbits: Unremarkable. Other: None. IMPRESSION: 1. No acute intracranial process. 2. Moderate chronic small-vessel disease. Electronically Signed   By: Orvan Falconer M.D.   On: 10/19/2022 21:22   CT Angio Chest PE W and/or Wo Contrast  Result Date: 10/19/2022 CLINICAL DATA:  History of prostate cancer. EXAM: CT ANGIOGRAPHY CHEST WITH CONTRAST TECHNIQUE: Multidetector CT imaging of the  chest was performed using the standard protocol during bolus administration of intravenous contrast. Multiplanar CT image reconstructions and MIPs were obtained to evaluate the vascular anatomy. RADIATION DOSE REDUCTION: This exam was performed according to the departmental dose-optimization program which includes automated exposure control, adjustment of the mA and/or kV according to patient size and/or use of iterative reconstruction technique. CONTRAST:  75mL OMNIPAQUE IOHEXOL 350 MG/ML SOLN COMPARISON:  None Available. FINDINGS: Cardiovascular: Significant breathing motion seen throughout the examination. This limits evaluation for pulmonary emboli. Nondiagnostic for small and peripheral emboli. No large or central embolus. Normal caliber thoracic aorta with mild calcified plaque. Coronary artery calcifications are seen. The heart is nonenlarged. No pericardial effusion. Mediastinum/Nodes: Slightly patulous thoracic esophagus. No specific abnormal lymph node enlargement seen in the axillary region, hilum or mediastinum. There are some small lymph nodes identified in the right lung hilum which are less than a cm in short axis and not pathologic by size criteria. Lungs/Pleura: Linear opacity lung bases likely scar or atelectasis. No consolidation, pneumothorax or effusion. Upper Abdomen: Along the upper abdomen the adrenal glands are preserved. There is some high density noted within the left kidney. Please correlate with prior CT angiogram of the head from same day Musculoskeletal: Diffuse degenerative changes of the spine. There is multilevel Schmorl's node changes with bridging osteophytes and syndesmophytes. Mild compression noted along the lower thoracic spine vertebral levels. Review of the MIP images confirms the above findings. IMPRESSION: Extensive breathing motion.  No large or central embolus. Aortic Atherosclerosis (ICD10-I70.0). Electronically Signed   By: Karen Kays M.D.   On: 10/19/2022 17:25    CT HEAD CODE STROKE WO CONTRAST  Result Date: 10/19/2022 CLINICAL DATA:  Code stroke. New onset dizziness and intermittent aphasia, resolved. EXAM: CT ANGIOGRAPHY HEAD AND NECK TECHNIQUE: Multidetector CT imaging of the head and neck was performed using the standard protocol during bolus administration of intravenous contrast. Multiplanar CT image reconstructions and MIPs were obtained to evaluate the vascular anatomy. Carotid stenosis measurements (when applicable) are obtained utilizing NASCET criteria, using the distal internal carotid diameter as the denominator. RADIATION DOSE REDUCTION: This exam was performed according to the departmental dose-optimization program which includes automated exposure control, adjustment of the mA and/or kV according to patient size and/or use of iterative reconstruction technique. CONTRAST:  75mL OMNIPAQUE IOHEXOL 350 MG/ML SOLN COMPARISON:  None Available. FINDINGS: CT HEAD FINDINGS Brain: There is no evidence of acute intracranial hemorrhage or extra-axial fluid collection. There is no evolved large vessel territorial infarct. There is hypodensity in the left corona radiata best seen in the coronal sequence which could reflect an acute infarct in the left MCA  distribution (4-32). ASPECTS is 9 Background parenchymal volume is normal. The ventricles are normal in size. Additional hypodensity in the supratentorial white matter is nonspecific but likely reflects sequela of underlying chronic small-vessel ischemic change. The pituitary and suprasellar region are normal. There is no mass lesion. There is no mass effect or midline shift. Vascular: There is calcification of the bilateral carotid siphons. Skull: Normal. Negative for fracture or focal lesion. Sinuses/Orbits: The paranasal sinuses are clear. The globes and orbits are unremarkable. Other: None. Review of the MIP images confirms the above findings CTA NECK FINDINGS Aortic arch: The imaged aortic arch is normal. The  origins of the major branch vessels are patent. The subclavian arteries are patent to the level imaged. Right carotid system: The right common carotid artery is patent. There is mixed plaque of the bifurcation resulting in approximately 30-40% stenosis. The distal internal carotid artery is patent. The external carotid artery is patent. There is no evidence of dissection or aneurysm. Left carotid system: The left common carotid artery is patent. There is mild atherosclerotic irregularity at the bifurcation without significant stenosis or occlusion. The distal internal carotid artery is patent. The external carotid artery is patent. There is no evidence of dissection or aneurysm. Vertebral arteries: Plaque of the right vertebral artery origin resulting in moderate stenosis. The vertebral arteries are otherwise patent, without hemodynamically significant stenosis or occlusion. There is no evidence of dissection or aneurysm. Skeleton: There is no acute osseous abnormality or suspicious osseous lesion. Other neck: The soft tissues of the neck are unremarkable. Upper chest: The imaged lung apices are clear. Review of the MIP images confirms the above findings CTA HEAD FINDINGS Anterior circulation: There is calcified plaque in the intracranial ICAs without greater than mild stenosis. The bilateral MCAs are patent, without proximal stenosis or occlusion. The bilateral ACAs are patent, without proximal stenosis or occlusion. The anterior communicating artery is normal. There is no aneurysm or AVM. Posterior circulation: The bilateral V4 segments are patent. The basilar artery is patent. The major cerebellar arteries appear patent. The bilateral PCAs are patent, without proximal stenosis or occlusion. A small left posterior communicating artery is identified. There is no aneurysm or AVM. Venous sinuses: As permitted by contrast timing, patent. Anatomic variants: None. Review of the MIP images confirms the above findings  IMPRESSION: 1. Possible acute to subacute infarct in the left corona radiata. Consider MRI for further evaluation. 2. No emergent vascular finding. 3. 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 Mild atherosclerotic irregularity in the left without significant stenosis. 4. 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 5. Patent intracranial vasculature. Findings communicated to Dr. Wilford Corner at 1:15 pm. Electronically Signed   By: Lesia Hausen M.D.   On: 10/19/2022 13:21   CT ANGIO HEAD NECK W WO CM (CODE STROKE)  Result Date: 10/19/2022 CLINICAL DATA:  Code stroke. New onset dizziness and intermittent aphasia, resolved. EXAM: CT ANGIOGRAPHY HEAD AND NECK TECHNIQUE: Multidetector CT imaging of the head and neck was performed using the standard protocol during bolus administration of intravenous contrast. Multiplanar CT image reconstructions and MIPs were obtained to evaluate the vascular anatomy. Carotid stenosis measurements (when applicable) are obtained utilizing NASCET criteria, using the distal internal carotid diameter as the denominator. RADIATION DOSE REDUCTION: This exam was performed according to the departmental dose-optimization program which includes automated exposure control, adjustment of the mA and/or kV according to patient size and/or use of iterative reconstruction technique. CONTRAST:  75mL OMNIPAQUE  IOHEXOL 350 MG/ML SOLN COMPARISON:  None Available. FINDINGS: CT HEAD FINDINGS Brain: There is no evidence of acute intracranial hemorrhage or extra-axial fluid collection. There is no evolved large vessel territorial infarct. There is hypodensity in the left corona radiata best seen in the coronal sequence which could reflect an acute infarct in the left MCA distribution (4-32). ASPECTS is 9 Background parenchymal volume is normal. The ventricles are normal in size. Additional hypodensity in the supratentorial white matter is nonspecific  but likely reflects sequela of underlying chronic small-vessel ischemic change. The pituitary and suprasellar region are normal. There is no mass lesion. There is no mass effect or midline shift. Vascular: There is calcification of the bilateral carotid siphons. Skull: Normal. Negative for fracture or focal lesion. Sinuses/Orbits: The paranasal sinuses are clear. The globes and orbits are unremarkable. Other: None. Review of the MIP images confirms the above findings CTA NECK FINDINGS Aortic arch: The imaged aortic arch is normal. The origins of the major branch vessels are patent. The subclavian arteries are patent to the level imaged. Right carotid system: The right common carotid artery is patent. There is mixed plaque of the bifurcation resulting in approximately 30-40% stenosis. The distal internal carotid artery is patent. The external carotid artery is patent. There is no evidence of dissection or aneurysm. Left carotid system: The left common carotid artery is patent. There is mild atherosclerotic irregularity at the bifurcation without significant stenosis or occlusion. The distal internal carotid artery is patent. The external carotid artery is patent. There is no evidence of dissection or aneurysm. Vertebral arteries: Plaque of the right vertebral artery origin resulting in moderate stenosis. The vertebral arteries are otherwise patent, without hemodynamically significant stenosis or occlusion. There is no evidence of dissection or aneurysm. Skeleton: There is no acute osseous abnormality or suspicious osseous lesion. Other neck: The soft tissues of the neck are unremarkable. Upper chest: The imaged lung apices are clear. Review of the MIP images confirms the above findings CTA HEAD FINDINGS Anterior circulation: There is calcified plaque in the intracranial ICAs without greater than mild stenosis. The bilateral MCAs are patent, without proximal stenosis or occlusion. The bilateral ACAs are patent,  without proximal stenosis or occlusion. The anterior communicating artery is normal. There is no aneurysm or AVM. Posterior circulation: The bilateral V4 segments are patent. The basilar artery is patent. The major cerebellar arteries appear patent. The bilateral PCAs are patent, without proximal stenosis or occlusion. A small left posterior communicating artery is identified. There is no aneurysm or AVM. Venous sinuses: As permitted by contrast timing, patent. Anatomic variants: None. Review of the MIP images confirms the above findings IMPRESSION: 1. Possible acute to subacute infarct in the left corona radiata. Consider MRI for further evaluation. 2. No emergent vascular finding. 3. 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 Mild atherosclerotic irregularity in the left without significant stenosis. 4. 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 5. Patent intracranial vasculature. Findings communicated to Dr. Wilford Corner at 1:15 pm. Electronically Signed   By: Lesia Hausen M.D.   On: 10/19/2022 13:21    Patient Profile     72 y.o. male with a hx of HLD, HTN, prostate CA, carotid artery stenosis s/p left CEA 9/23  who is being seen today for the evaluation of afib with RVR at the request of Hervey Ard, MD.   Assessment & Plan    New Onset Atrial Fibrillation with RVR Unknown duration -patient had no palpitations  but complained of onset of CP while out for a walk and dizziness -found to be in afib with RVR in ER -Started on IV Cardizem drip and heparin drip yesterday -Early this morning converted to sinus bradycardia -CHADS2VASC score 5 (age>65,HTN,PAD,CVA) -recommend transitioning to DOAC when ok per Neuro -2D echo is pending this morning -Check TSH this morning -K+ 4.3 -Mag 2.1 -recommend outpt sleep study -Will transition to Lopressor today   2.  Chest pain -likely related to demand ischemia in the setting of afib with RVR -chest  CTA with coronary artery calcifications -check hsTrop -2D echo pending to define LV function -will need ischemic workup at some point>>not candidate for cath due to TIA -consider outpatient Stress PET CT to assess for ischemia -FLP this morning showed LDL 59, HDL 54 and triglycerides 108 -He was on Crestor 20 mg at home but has been transition to atorvastatin 80 mg daily.  Since LDL is at goal will transition back to Crestor 20 mg daily    3.  HTN -BP elevated at 163/72mmHg on arrival -BP controlled on exam at 131/76 mmHg -Neuro wants permissive HTN due to acute CVA -He was on amlodipine 5 mg daily at home>>will transition IV Cardizem to Toprol post CTA   4.  Carotid artery stenosis -s/p left CEA 9/23 -CT of head and neck today with 30-40% right carotid bifurcation stenosis and mild atherosclerosis of the left, moderate stenosis of right vertebral artery origin -continue statin -he has not been on ASA due to rectal bleeding   5.  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  TIA -admitted with stroke like sx and CT with ? CVA but MRI with no CVA -per Neuro      I have spent a total of 30 minutes with patient reviewing MRI , telemetry, EKGs, labs and examining patient as well as establishing an assessment and plan that was discussed with the patient.  > 50% of time was spent in direct patient care.    For questions or updates, please contact [facility] Please consult [email or web address] for contact info under        Signed, Armanda Magic, MD  10/20/2022, 7:40 AM

## 2022-10-20 NOTE — Progress Notes (Signed)
Central telemetry called pt had 2.26 second pause on the monitor. Current  HR  ranges from 70's-90's, Aflutter, Ongoing Cardizem drip at 39ml/hr. BP 127/73. Pt denies pain/discomfort. Will continue to monitor.

## 2022-10-20 NOTE — Progress Notes (Signed)
ANTICOAGULATION CONSULT NOTE - Initial Consult  Pharmacy Consult for Heparin Indication:  AF / Stroke protocol (pending MRI for code stroke) - stroke protocol no bolus  Allergies  Allergen Reactions   Sulfa Antibiotics Hives    Patient Measurements: Height: 5\' 7"  (170.2 cm) Weight: 83 kg (182 lb 15.7 oz) IBW/kg (Calculated) : 66.1 Heparin Dosing Weight: 82.7 kg  Vital Signs: Temp: 97.4 F (36.3 C) (04/30 0715) Temp Source: Oral (04/30 0715) BP: 131/76 (04/30 0715) Pulse Rate: 58 (04/30 0715)  Labs: Recent Labs    10/19/22 1300 10/20/22 0103  HGB 16.3  17.7*  --   HCT 50.3  52.0  --   PLT 268  --   APTT 31  --   LABPROT 14.2  --   INR 1.1  --   HEPARINUNFRC  --  <0.10*  CREATININE 1.41*  1.40*  --      Estimated Creatinine Clearance: 51 mL/min (A) (by C-G formula based on SCr of 1.41 mg/dL (H)).   Medical History: Past Medical History:  Diagnosis Date   Arthritis    BPH associated with nocturia    Carotid stenosis, asymptomatic, bilateral 2017   followed by pcp and vascular dr Chestine Spore;    s/p left CEA 02-25-2022 for stenosis >80%; (post op note in epic 03-17-2022 healing well)   last duplex in epic 02-03-2022 right ICA 40-59% and left ICA 80-99%   Elevated blood pressure reading without diagnosis of hypertension    Family history of prostate cancer    History of anal fissures    Hyperlipidemia    Malignant neoplasm prostate [facility]) 04/2022   urologist--- dr newsome/  radiation oncologist--- dr Kathrynn Running;  dx 11/ 2023,  Gleason 4+3   Microcytic anemia    Assessment: 71 yom with a history of left carotid stenosis status post endarterectomy, HTN, HLD, prostate cancer. Patient is presenting as a code stroke. No TNK administered. Anticoagulation needed for AF. Neurology recommending stroke protocol heparin given pending MRI and uncertain presence/size of stroke. Heparin per pharmacy consult placed for  AF / Stroke protocol (pending MRI for code stroke) - stroke  protocol no bolus . Patient is not on anticoagulation prior to arrival.  Heparin level subtherapeutic at 0.21 after rate increase to 1250 units/hr overnight. MRI this AM negative - ok to target full heparin level goal and bolus IV heparin as needed for subtherapeutic levels per discussion with TRH and neuro.  Goal of Therapy:  Heparin level 0.3-0.7 units/ml Monitor platelets by anticoagulation protocol: Yes   Plan:  Small heparin bolus of 1000 units IV x1 Increase heparin to 1350 units/hr Check heparin level in 6 hours F/u ability to transition to Eliquis after GI w/u  Rexford Maus, PharmD, BCPS 10/20/2022 10:08 AM

## 2022-10-20 NOTE — Care Management (Signed)
  Transition of Care [facility]) Screening Note   Patient Details  Name: Peter Roberts Date of Birth: 1950-11-27   Transition of Care [facility]) CM/SW Contact:    Gordy Clement, RN Phone Number: 10/20/2022, 8:38 AM    Transition of Care Department [facility]) has reviewed patient . Patient is from home alone. We will continue to monitor patient advancement through interdisciplinary progression rounds. If new patient transition needs arise, please place a TOC consult.

## 2022-10-20 NOTE — Progress Notes (Signed)
  Echo showed LVEF of 60-65% with normal wall motion and diastolic parameters and no significant valvular disease. Coronary CTA showed a coronary calcium score of 585 (78th percentile for age and gender) with moderate stenosis (50-69%) of ostial D1 and OM1 and mild stenosis (25-49% of mid LAD, D2, proximal Lcx, and distal RCA. FFR showed no hemodynamically significant lesion although FFR note able to be performed on OM1 and D1 lesions due to small vessel size. Called and notified patient of results. He states he is feeling well with no recurrent chest pain. Continue statin. He will need Eliquis at discharge but is currently on IV Heparin in anticipation for GI procedure tomorrow.  Corrin Parker, PA-C 10/20/2022 5:24 PM

## 2022-10-20 NOTE — Telephone Encounter (Signed)
Pharmacy Patient Advocate Encounter  Insurance verification completed.    The patient is insured through [employer]   The patient is currently admitted and ran test claims for the following: Eliquis.  Copays and coinsurance results were relayed to Inpatient clinical team.  

## 2022-10-20 NOTE — Progress Notes (Signed)
2D echo attempted, patient with therapy. Will try later

## 2022-10-20 NOTE — Evaluation (Signed)
Physical Therapy Evaluation and Discharge Patient Details Name: Peter Roberts MRN: [id number] DOB: 12/09/1950 Today's Date: 10/20/2022  History of Present Illness  Pt is a 72 y/o male presenting on 4/29 with dizziness, chest pain and dysarthria. CT, MRI negative. Found with new onset afib with RVR. PMH includes: arthritis, carotid stenosis, hypertension, L endarterectomy, proctitis.  Clinical Impression  Patient evaluated by Physical Therapy with no further acute PT needs identified. All education has been completed and the patient has no further questions. Pt from home alone but has supportive family. Retired but very active with grandkids and volunteering in the community.  Pt's stroke-like symptoms have resolved. Ambulating independently without deficits. HR remained stable in the 70's with ambulation and normal HR response to stairs. Pt relayed that he has intermittent sciatica and is noted to have forward head posture. Postural exercises given to perform at home.    See below for any follow-up Physical Therapy or equipment needs. PT is signing off. Thank you for this referral.        Recommendations for follow up therapy are one component of a multi-disciplinary discharge planning process, led by the attending physician.  Recommendations may be updated based on patient status, additional functional criteria and insurance authorization.  Follow Up Recommendations       Assistance Recommended at Discharge None  Patient can return home with the following       Equipment Recommendations None recommended by PT  Recommendations for Other Services       Functional Status Assessment Patient has not had a recent decline in their functional status     Precautions / Restrictions Precautions Precautions: None Restrictions Weight Bearing Restrictions: No      Mobility  Bed Mobility Overal bed mobility: Independent                  Transfers Overall transfer level:  Independent Equipment used: None                    Ambulation/Gait Ambulation/Gait assistance: Independent Gait Distance (Feet): 400 Feet Assistive device: None Gait Pattern/deviations: WFL(Within Functional Limits) Gait velocity: WFL Gait velocity interpretation: >4.37 ft/sec, indicative of normal walking speed   General Gait Details: normal gait pattern but with forward head and pt mentions that he struggles with sciatica at times  Stairs Stairs: Yes Stairs assistance: Independent Stair Management: No rails, Alternating pattern, Forwards Number of Stairs: 5 General stair comments: no difficulties on stairs  Wheelchair Mobility    Modified Rankin (Stroke Patients Only) Modified Rankin (Stroke Patients Only) Pre-Morbid Rankin Score: No symptoms Modified Rankin: No significant disability     Balance Overall balance assessment: No apparent balance deficits (not formally assessed)                                           Pertinent Vitals/Pain Pain Assessment Pain Assessment: Faces Faces Pain Scale: Hurts little more Pain Location: B knees (OA) Pain Descriptors / Indicators: Aching Pain Intervention(s): Monitored during session    Home Living Family/patient expects to be discharged to:: Private residence Living Arrangements: Alone Available Help at Discharge: Family;Friend(s) Type of Home: House Home Access: Stairs to enter Entrance Stairs-Rails: None Entrance Stairs-Number of Steps: 3   Home Layout: One level Home Equipment: None Additional Comments: pt very active with 9 grandkids and volunteering in the community.    Prior Function Prior  Level of Function : Independent/Modified Independent;Driving                     Hand Dominance   Dominant Hand: Right    Extremity/Trunk Assessment   Upper Extremity Assessment Upper Extremity Assessment: Overall WFL for tasks assessed    Lower Extremity Assessment Lower  Extremity Assessment: Overall WFL for tasks assessed    Cervical / Trunk Assessment Cervical / Trunk Assessment: Other exceptions Cervical / Trunk Exceptions: forward head posture  Communication   Communication: No difficulties  Cognition Arousal/Alertness: Awake/alert Behavior During Therapy: WFL for tasks assessed/performed Overall Cognitive Status: Within Functional Limits for tasks assessed                                          General Comments General comments (skin integrity, edema, etc.): HR 76 bpm    Exercises Other Exercises Other Exercises: standing chin tucks against wall x10 Other Exercises: supine chin tucks x10, no pillow Other Exercises: chest stretch x30 secs   Assessment/Plan    PT Assessment Patient does not need any further PT services  PT Problem List         PT Treatment Interventions      PT Goals (Current goals can be found in the Care Plan section)  Acute Rehab PT Goals Patient Stated Goal: return home PT Goal Formulation: All assessment and education complete, DC therapy    Frequency       Co-evaluation               AM-PAC PT "6 Clicks" Mobility  Outcome Measure Help needed turning from your back to your side while in a flat bed without using bedrails?: None Help needed moving from lying on your back to sitting on the side of a flat bed without using bedrails?: None Help needed moving to and from a bed to a chair (including a wheelchair)?: None Help needed standing up from a chair using your arms (e.g., wheelchair or bedside chair)?: None Help needed to walk in hospital room?: None Help needed climbing 3-5 steps with a railing? : None 6 Click Score: 24    End of Session Equipment Utilized During Treatment: Gait belt Activity Tolerance: Patient tolerated treatment well Patient left: in bed;with call bell/phone within reach Nurse Communication: Mobility status PT Visit Diagnosis: Difficulty in walking, not  elsewhere classified (R26.2)    Time: 1610-9604 PT Time Calculation (min) (ACUTE ONLY): 22 min   Charges:   PT Evaluation $PT Eval Moderate Complexity: 1 Mod          Lyanne Co, PT  Acute Rehab Services Secure chat preferred Office [phone]   Lawana Chambers Omnia Dollinger 10/20/2022, 11:53 AM

## 2022-10-20 NOTE — TOC Benefit Eligibility Note (Signed)
Patient Product/process development scientist completed.    The patient is currently admitted and upon discharge could be taking Elquis 5 mg.  The current 30 day co-pay is $172.93.   The patient is insured through [employer]  This test claim was processed through [employer]- copay amounts may vary at other pharmacies due to [employer], or as the patient moves through the different stages of their insurance plan.  Peter Roberts, CPHT Pharmacy Patient Advocate Specialist [facility] Patient Advocate Team Direct Number: [phone]  Fax: [phone]

## 2022-10-20 NOTE — H&P (View-Only) (Signed)
[facility] Gastroenterology Consult  Referring Provider: No ref. provider found Primary Care Physician:  Elias Else, MD (Inactive) Primary Gastroenterologist: Dr. Bosie Clos  Reason for Consultation: Rectal bleeding  SUBJECTIVE:   HPI: Peter Roberts is a 72 y.o. male with past medical history significant for, hyperlipidemia and prostate cancer diagnosed in November 2023.  CT imaging of abdomen and pelvis on 08/23/2022 showed rectal wall thickening and surrounding fat stranding compatible with proctitis.  He was planning to have flexible sigmoidoscopy with Dr. Bosie Clos in the outpatient setting.  He has not received radiation for prostate cancer.  He presented the hospital on 10/19/2022 with strokelike symptoms, dizziness and aphasia.  CT imaging showed possible acute to subacute left corona radiata infarct, MRI brain showed no acute intracranial process, moderate chronic small vessel disease.  He was found to have atrial fibrillation with rapid ventricular response on presentation, was started on Cardizem with plans to transition to oral anticoagulant.  Prior to initiating anticoagulation, gastroenterology was consulted to request flexible sigmoidoscopy.  Patient denied current rectal bleeding, noted that the last rectal bleeding he experienced was in March.  He has no abdominal pain.  No nausea or vomiting.  No chest pain or shortness of breath.  He is agreeable to flexible sigmoidoscopy on 10/21/2022.  Past Medical History:  Diagnosis Date   Arthritis    BPH associated with nocturia    Carotid stenosis, asymptomatic, bilateral 2017   followed by pcp and vascular dr Chestine Spore;    s/p left CEA 02-25-2022 for stenosis >80%; (post op note in epic 03-17-2022 healing well)   last duplex in epic 02-03-2022 right ICA 40-59% and left ICA 80-99%   Elevated blood pressure reading without diagnosis of hypertension    Family history of prostate cancer    History of anal fissures    Hyperlipidemia    Malignant  neoplasm prostate (HCC) 04/2022   urologist--- dr newsome/  radiation oncologist--- dr Kathrynn Running;  dx 11/ 2023,  Gleason 4+3   Microcytic anemia    Past Surgical History:  Procedure Laterality Date   CARPAL TUNNEL RELEASE Left 12/2021   COLONOSCOPY  2019   ENDARTERECTOMY Left 02/25/2022   Procedure: LEFT CAROTID ENDARTERECTOMY;  Surgeon: Cephus Shelling, MD;  Location: [facility] OR;  Service: Vascular;  Laterality: Left;   KNEE ARTHROSCOPY Right 2002   Prior to Admission medications   Medication Sig Start Date End Date Taking? Authorizing Provider  amLODipine (NORVASC) 5 MG tablet Take 1 tablet (5 mg total) by mouth daily. 08/25/22 08/25/23  Jonah Blue, MD  Cholecalciferol (VITAMIN D3) 125 MCG (5000 UT) capsule Take 5,000 Units by mouth daily.    [provider]  diclofenac Sodium (VOLTAREN) 1 % GEL Apply 4 g topically 4 (four) times daily as needed (pain).    [provider]  Docusate Calcium (STOOL SOFTENER PO) Take 1 capsule by mouth daily as needed (constipation).    [provider]  Ferrous Sulfate (IRON) 90 (18 Fe) MG TABS Take 90 mg by mouth every evening.    [provider]  Magnesium 500 MG CAPS Take 1 capsule by mouth every evening.    [provider]  Oil of Oregano 1500 MG CAPS Take 1 capsule by mouth daily.    [provider]  OVER THE COUNTER MEDICATION Take 3 capsules by mouth daily after lunch. Prostagenix    [provider]  OVER THE COUNTER MEDICATION Take 1 tablet by mouth daily. Circ O2    [provider]  Probiotic Product (PROBIOTIC DAILY) CAPS Take 1 capsule by mouth daily.    [provider]  rosuvastatin (CRESTOR) 20 MG tablet Take 1 tablet (20 mg total) by mouth every evening. Patient taking differently: Take 20 mg by mouth every evening. 02/26/22   Cephus Shelling, MD   Current Facility-Administered Medications  Medication Dose Route Frequency Provider Last Rate Last Admin   0.9 %   sodium chloride infusion   Intravenous Continuous Hughie Closs, MD 75 mL/hr at 10/20/22 0900 Infusion Verify at 10/20/22 0900   acetaminophen (TYLENOL) tablet 650 mg  650 mg Oral Q4H PRN Hughie Closs, MD       Or   acetaminophen (TYLENOL) 160 MG/5ML solution 650 mg  650 mg Per Tube Q4H PRN Hughie Closs, MD       Or   acetaminophen (TYLENOL) suppository 650 mg  650 mg Rectal Q4H PRN Hughie Closs, MD       diltiazem (CARDIZEM) 125 mg in dextrose 5% 125 mL (1 mg/mL) infusion  5-15 mg/hr Intravenous Continuous Loetta Rough, MD 5 mL/hr at 10/20/22 0800 5 mg/hr at 10/20/22 0800   docusate sodium (COLACE) capsule 100 mg  100 mg Oral BID Hughie Closs, MD   100 mg at 10/19/22 2141   heparin ADULT infusion 100 units/mL (25000 units/243mL)  1,350 Units/hr Intravenous Continuous Rexford Maus, RPH 13.5 mL/hr at 10/20/22 1219 1,350 Units/hr at 10/20/22 1219   rosuvastatin (CRESTOR) tablet 20 mg  20 mg Oral QPM Hughie Closs, MD   20 mg at 10/19/22 2141   sodium chloride flush (NS) 0.9 % injection 3 mL  3 mL Intravenous Once Loetta Rough, MD       Allergies as of 10/19/2022 - Reviewed 10/19/2022  Allergen Reaction Noted   Sulfa antibiotics Hives 02/16/2022   Family History  Problem Relation Age of Onset   Lung disease Mother    Heart attack Father    Heart disease Father    Social History   Socioeconomic History   Marital status: Widowed    Spouse name: Not on file   Number of children: Not on file   Years of education: Not on file   Highest education level: Not on file  Occupational History   Not on file  Tobacco Use   Smoking status: Never    Passive exposure: Never   Smokeless tobacco: Never  Vaping Use   Vaping Use: Never used  Substance and Sexual Activity   Alcohol use: No   Drug use: Never   Sexual activity: Not on file  Other Topics Concern   Not on file  Social History Narrative   Not on file   Social Determinants of Health   Financial Resource Strain: Not  on file  Food Insecurity: No Food Insecurity (10/19/2022)   Hunger Vital Sign    Worried About Running Out of Food in the Last Year: Never true    Ran Out of Food in the Last Year: Never true  Transportation Needs: No Transportation Needs (10/19/2022)   PRAPARE - Administrator, Civil Service (Medical): No    Lack of Transportation (Non-Medical): No  Physical Activity: Not on file  Stress: Not on file  Social Connections: Not on file  Intimate Partner Violence: Not At Risk (10/19/2022)   Humiliation, Afraid, Rape, and Kick questionnaire    Fear of Current or Ex-Partner: No    Emotionally Abused: No    Physically Abused: No    Sexually Abused: No  Review of Systems:  Review of Systems  Respiratory:  Negative for shortness of breath.   Cardiovascular:  Negative for chest pain.  Gastrointestinal:  Negative for abdominal pain, blood in stool, nausea and vomiting.    OBJECTIVE:   Temp:  [97.4 F (36.3 C)-98.3 F (36.8 C)] 97.4 F (36.3 C) (04/30 0715) Pulse Rate:  [32-132] 58 (04/30 0715) Resp:  [13-28] 18 (04/30 0715) BP: (109-150)/(62-100) 131/76 (04/30 0715) SpO2:  [96 %-100 %] 98 % (04/30 0715) Last BM Date : 10/19/22 Physical Exam Constitutional:      General: He is not in acute distress.    Appearance: He is not ill-appearing, toxic-appearing or diaphoretic.  Cardiovascular:     Rate and Rhythm: Normal rate and regular rhythm.  Pulmonary:     Effort: No respiratory distress.     Breath sounds: Normal breath sounds.  Abdominal:     General: Bowel sounds are normal. There is no distension.     Palpations: Abdomen is soft.     Tenderness: There is no abdominal tenderness. There is no guarding.  Musculoskeletal:     Right lower leg: No edema.     Left lower leg: No edema.  Neurological:     Mental Status: He is alert.     Labs: Recent Labs    10/19/22 1300  WBC 12.5*  HGB 16.3  17.7*  HCT 50.3  52.0  PLT 268   BMET Recent Labs     10/19/22 1300  NA 134*  138  K 4.3  4.2  CL 101  104  CO2 21*  GLUCOSE 113*  112*  BUN 24*  26*  CREATININE 1.41*  1.40*  CALCIUM 9.6   LFT Recent Labs    10/19/22 1300  PROT 7.9  ALBUMIN 4.3  AST 37  ALT 37  ALKPHOS 78  BILITOT 0.9   PT/INR Recent Labs    10/19/22 1300  LABPROT 14.2  INR 1.1    Diagnostic imaging: ECHOCARDIOGRAM COMPLETE  Result Date: 10/20/2022    ECHOCARDIOGRAM REPORT   Patient Name:   MASAICHI KRACHT Date of Exam: 10/20/2022 Medical Rec #:  [id number]    Height:       67.0 in Accession #:    [id number]   Weight:       183.0 lb Date of Birth:  1951/04/21    BSA:          1.947 m Patient Age:    71 years     BP:           131/76 mmHg Patient Gender: M            HR:           58 bpm. Exam Location:  Inpatient Procedure: 2D Echo, Cardiac Doppler, Color Doppler and Intracardiac            Opacification Agent Indications:    Stroke.  History:        Patient has no prior history of Echocardiogram examinations.                 Abnormal ECG, Stroke, Arrythmias:Atrial Fibrillation;                 Signs/Symptoms:Chest Pain.  Sonographer:    Sheralyn Boatman RDCS Referring Phys: [id number] [facility]  Sonographer Comments: Suboptimal apical window. Paused for stat lab draw IMPRESSIONS  1. Left ventricular ejection fraction, by estimation, is 60 to 65%. The left ventricle has normal function. The left  ventricle has no regional wall motion abnormalities. Left ventricular diastolic parameters were normal.  2. Right ventricular systolic function is normal. The right ventricular size is normal.  3. The mitral valve is normal in structure. Trivial mitral valve regurgitation. No evidence of mitral stenosis.  4. The aortic valve was not well visualized. Aortic valve regurgitation is not visualized. No aortic stenosis is present.  5. The inferior vena cava is normal in size with greater than 50% respiratory variability, suggesting right atrial pressure of 3 mmHg. Comparison(s): No prior  Echocardiogram. FINDINGS  Left Ventricle: Left ventricular ejection fraction, by estimation, is 60 to 65%. The left ventricle has normal function. The left ventricle has no regional wall motion abnormalities. Definity contrast agent was given IV to delineate the left ventricular  endocardial borders. The left ventricular internal cavity size was normal in size. There is no left ventricular hypertrophy. Left ventricular diastolic parameters were normal. Right Ventricle: The right ventricular size is normal. No increase in right ventricular wall thickness. Right ventricular systolic function is normal. Left Atrium: Left atrial size was normal in size. Right Atrium: Right atrial size was normal in size. Pericardium: There is no evidence of pericardial effusion. Mitral Valve: The mitral valve is normal in structure. Trivial mitral valve regurgitation. No evidence of mitral valve stenosis. Tricuspid Valve: The tricuspid valve is normal in structure. Tricuspid valve regurgitation is trivial. No evidence of tricuspid stenosis. Aortic Valve: The aortic valve was not well visualized. Aortic valve regurgitation is not visualized. No aortic stenosis is present. Pulmonic Valve: The pulmonic valve was normal in structure. Pulmonic valve regurgitation is not visualized. No evidence of pulmonic stenosis. Aorta: The aortic root and ascending aorta are structurally normal, with no evidence of dilitation. Venous: The inferior vena cava is normal in size with greater than 50% respiratory variability, suggesting right atrial pressure of 3 mmHg. IAS/Shunts: No atrial level shunt detected by color flow Doppler.  LEFT VENTRICLE PLAX 2D LVIDd:         4.30 cm   Diastology LVIDs:         2.80 cm   LV e' medial:    8.27 cm/s LV PW:         0.90 cm   LV E/e' medial:  9.4 LV IVS:        1.00 cm   LV e' lateral:   10.40 cm/s LVOT diam:     2.40 cm   LV E/e' lateral: 7.5 LV SV:         88 LV SV Index:   45 LVOT Area:     4.52 cm  RIGHT  VENTRICLE             IVC RV S prime:     14.60 cm/s  IVC diam: 1.60 cm TAPSE (M-mode): 2.2 cm LEFT ATRIUM             Index        RIGHT ATRIUM           Index LA diam:        3.90 cm 2.00 cm/m   RA Area:     15.80 cm LA Vol (A2C):   42.6 ml 21.88 ml/m  RA Volume:   34.60 ml  17.77 ml/m LA Vol (A4C):   27.0 ml 13.87 ml/m LA Biplane Vol: 33.7 ml 17.31 ml/m  AORTIC VALVE LVOT Vmax:   90.70 cm/s LVOT Vmean:  56.600 cm/s LVOT VTI:    0.195 m  AORTA Ao  Root diam: 2.90 cm Ao Asc diam:  3.10 cm MITRAL VALVE MV Area (PHT): 3.42 cm    SHUNTS MV Decel Time: 222 msec    Systemic VTI:  0.20 m MV E velocity: 78.10 cm/s  Systemic Diam: 2.40 cm MV A velocity: 81.20 cm/s MV E/A ratio:  0.96 Riley Lam MD Electronically signed by Riley Lam MD Signature Date/Time: 10/20/2022/1:21:47 PM    Final    MR BRAIN WO CONTRAST  Result Date: 10/19/2022 CLINICAL DATA:  Transient ischemic attack. Neuro deficit, acute, stroke suspected. EXAM: MRI HEAD WITHOUT CONTRAST TECHNIQUE: Multiplanar, multiecho pulse sequences of the brain and surrounding structures were obtained without intravenous contrast. COMPARISON:  CTA head/neck 10/19/2022. FINDINGS: Brain: No acute infarct or hemorrhage. Moderate chronic small-vessel disease. No hydrocephalus or extra-axial collection. No foci of abnormal susceptibility. No mass or midline shift. Vascular: Normal flow voids. Skull and upper cervical spine: Normal marrow signal. Sinuses/Orbits: Unremarkable. Other: None. IMPRESSION: 1. No acute intracranial process. 2. Moderate chronic small-vessel disease. Electronically Signed   By: Orvan Falconer M.D.   On: 10/19/2022 21:22   CT Angio Chest PE W and/or Wo Contrast  Result Date: 10/19/2022 CLINICAL DATA:  History of prostate cancer. EXAM: CT ANGIOGRAPHY CHEST WITH CONTRAST TECHNIQUE: Multidetector CT imaging of the chest was performed using the standard protocol during bolus administration of intravenous contrast. Multiplanar  CT image reconstructions and MIPs were obtained to evaluate the vascular anatomy. RADIATION DOSE REDUCTION: This exam was performed according to the departmental dose-optimization program which includes automated exposure control, adjustment of the mA and/or kV according to patient size and/or use of iterative reconstruction technique. CONTRAST:  75mL OMNIPAQUE IOHEXOL 350 MG/ML SOLN COMPARISON:  None Available. FINDINGS: Cardiovascular: Significant breathing motion seen throughout the examination. This limits evaluation for pulmonary emboli. Nondiagnostic for small and peripheral emboli. No large or central embolus. Normal caliber thoracic aorta with mild calcified plaque. Coronary artery calcifications are seen. The heart is nonenlarged. No pericardial effusion. Mediastinum/Nodes: Slightly patulous thoracic esophagus. No specific abnormal lymph node enlargement seen in the axillary region, hilum or mediastinum. There are some small lymph nodes identified in the right lung hilum which are less than a cm in short axis and not pathologic by size criteria. Lungs/Pleura: Linear opacity lung bases likely scar or atelectasis. No consolidation, pneumothorax or effusion. Upper Abdomen: Along the upper abdomen the adrenal glands are preserved. There is some high density noted within the left kidney. Please correlate with prior CT angiogram of the head from same day Musculoskeletal: Diffuse degenerative changes of the spine. There is multilevel Schmorl's node changes with bridging osteophytes and syndesmophytes. Mild compression noted along the lower thoracic spine vertebral levels. Review of the MIP images confirms the above findings. IMPRESSION: Extensive breathing motion.  No large or central embolus. Aortic Atherosclerosis (ICD10-I70.0). Electronically Signed   By: Karen Kays M.D.   On: 10/19/2022 17:25   CT HEAD CODE STROKE WO CONTRAST  Result Date: 10/19/2022 CLINICAL DATA:  Code stroke. New onset dizziness and  intermittent aphasia, resolved. EXAM: CT ANGIOGRAPHY HEAD AND NECK TECHNIQUE: Multidetector CT imaging of the head and neck was performed using the standard protocol during bolus administration of intravenous contrast. Multiplanar CT image reconstructions and MIPs were obtained to evaluate the vascular anatomy. Carotid stenosis measurements (when applicable) are obtained utilizing NASCET criteria, using the distal internal carotid diameter as the denominator. RADIATION DOSE REDUCTION: This exam was performed according to the departmental dose-optimization program which includes automated exposure control, adjustment of the  mA and/or kV according to patient size and/or use of iterative reconstruction technique. CONTRAST:  75mL OMNIPAQUE IOHEXOL 350 MG/ML SOLN COMPARISON:  None Available. FINDINGS: CT HEAD FINDINGS Brain: There is no evidence of acute intracranial hemorrhage or extra-axial fluid collection. There is no evolved large vessel territorial infarct. There is hypodensity in the left corona radiata best seen in the coronal sequence which could reflect an acute infarct in the left MCA distribution (4-32). ASPECTS is 9 Background parenchymal volume is normal. The ventricles are normal in size. Additional hypodensity in the supratentorial white matter is nonspecific but likely reflects sequela of underlying chronic small-vessel ischemic change. The pituitary and suprasellar region are normal. There is no mass lesion. There is no mass effect or midline shift. Vascular: There is calcification of the bilateral carotid siphons. Skull: Normal. Negative for fracture or focal lesion. Sinuses/Orbits: The paranasal sinuses are clear. The globes and orbits are unremarkable. Other: None. Review of the MIP images confirms the above findings CTA NECK FINDINGS Aortic arch: The imaged aortic arch is normal. The origins of the major branch vessels are patent. The subclavian arteries are patent to the level imaged. Right carotid  system: The right common carotid artery is patent. There is mixed plaque of the bifurcation resulting in approximately 30-40% stenosis. The distal internal carotid artery is patent. The external carotid artery is patent. There is no evidence of dissection or aneurysm. Left carotid system: The left common carotid artery is patent. There is mild atherosclerotic irregularity at the bifurcation without significant stenosis or occlusion. The distal internal carotid artery is patent. The external carotid artery is patent. There is no evidence of dissection or aneurysm. Vertebral arteries: Plaque of the right vertebral artery origin resulting in moderate stenosis. The vertebral arteries are otherwise patent, without hemodynamically significant stenosis or occlusion. There is no evidence of dissection or aneurysm. Skeleton: There is no acute osseous abnormality or suspicious osseous lesion. Other neck: The soft tissues of the neck are unremarkable. Upper chest: The imaged lung apices are clear. Review of the MIP images confirms the above findings CTA HEAD FINDINGS Anterior circulation: There is calcified plaque in the intracranial ICAs without greater than mild stenosis. The bilateral MCAs are patent, without proximal stenosis or occlusion. The bilateral ACAs are patent, without proximal stenosis or occlusion. The anterior communicating artery is normal. There is no aneurysm or AVM. Posterior circulation: The bilateral V4 segments are patent. The basilar artery is patent. The major cerebellar arteries appear patent. The bilateral PCAs are patent, without proximal stenosis or occlusion. A small left posterior communicating artery is identified. There is no aneurysm or AVM. Venous sinuses: As permitted by contrast timing, patent. Anatomic variants: None. Review of the MIP images confirms the above findings IMPRESSION: 1. Possible acute to subacute infarct in the left corona radiata. Consider MRI for further evaluation. 2. No  emergent vascular finding. 3. 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 Mild atherosclerotic irregularity in the left without significant stenosis. 4. 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 5. Patent intracranial vasculature. Findings communicated to Dr. Wilford Corner at 1:15 pm. Electronically Signed   By: Lesia Hausen M.D.   On: 10/19/2022 13:21   CT ANGIO HEAD NECK W WO CM (CODE STROKE)  Result Date: 10/19/2022 CLINICAL DATA:  Code stroke. New onset dizziness and intermittent aphasia, resolved. EXAM: CT ANGIOGRAPHY HEAD AND NECK TECHNIQUE: Multidetector CT imaging of the head and neck was performed using the standard protocol during bolus  administration of intravenous contrast. Multiplanar CT image reconstructions and MIPs were obtained to evaluate the vascular anatomy. Carotid stenosis measurements (when applicable) are obtained utilizing NASCET criteria, using the distal internal carotid diameter as the denominator. RADIATION DOSE REDUCTION: This exam was performed according to the departmental dose-optimization program which includes automated exposure control, adjustment of the mA and/or kV according to patient size and/or use of iterative reconstruction technique. CONTRAST:  75mL OMNIPAQUE IOHEXOL 350 MG/ML SOLN COMPARISON:  None Available. FINDINGS: CT HEAD FINDINGS Brain: There is no evidence of acute intracranial hemorrhage or extra-axial fluid collection. There is no evolved large vessel territorial infarct. There is hypodensity in the left corona radiata best seen in the coronal sequence which could reflect an acute infarct in the left MCA distribution (4-32). ASPECTS is 9 Background parenchymal volume is normal. The ventricles are normal in size. Additional hypodensity in the supratentorial white matter is nonspecific but likely reflects sequela of underlying chronic small-vessel ischemic change. The pituitary and suprasellar region are  normal. There is no mass lesion. There is no mass effect or midline shift. Vascular: There is calcification of the bilateral carotid siphons. Skull: Normal. Negative for fracture or focal lesion. Sinuses/Orbits: The paranasal sinuses are clear. The globes and orbits are unremarkable. Other: None. Review of the MIP images confirms the above findings CTA NECK FINDINGS Aortic arch: The imaged aortic arch is normal. The origins of the major branch vessels are patent. The subclavian arteries are patent to the level imaged. Right carotid system: The right common carotid artery is patent. There is mixed plaque of the bifurcation resulting in approximately 30-40% stenosis. The distal internal carotid artery is patent. The external carotid artery is patent. There is no evidence of dissection or aneurysm. Left carotid system: The left common carotid artery is patent. There is mild atherosclerotic irregularity at the bifurcation without significant stenosis or occlusion. The distal internal carotid artery is patent. The external carotid artery is patent. There is no evidence of dissection or aneurysm. Vertebral arteries: Plaque of the right vertebral artery origin resulting in moderate stenosis. The vertebral arteries are otherwise patent, without hemodynamically significant stenosis or occlusion. There is no evidence of dissection or aneurysm. Skeleton: There is no acute osseous abnormality or suspicious osseous lesion. Other neck: The soft tissues of the neck are unremarkable. Upper chest: The imaged lung apices are clear. Review of the MIP images confirms the above findings CTA HEAD FINDINGS Anterior circulation: There is calcified plaque in the intracranial ICAs without greater than mild stenosis. The bilateral MCAs are patent, without proximal stenosis or occlusion. The bilateral ACAs are patent, without proximal stenosis or occlusion. The anterior communicating artery is normal. There is no aneurysm or AVM. Posterior  circulation: The bilateral V4 segments are patent. The basilar artery is patent. The major cerebellar arteries appear patent. The bilateral PCAs are patent, without proximal stenosis or occlusion. A small left posterior communicating artery is identified. There is no aneurysm or AVM. Venous sinuses: As permitted by contrast timing, patent. Anatomic variants: None. Review of the MIP images confirms the above findings IMPRESSION: 1. Possible acute to subacute infarct in the left corona radiata. Consider MRI for further evaluation. 2. No emergent vascular finding. 3. 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 Mild atherosclerotic irregularity in the left without significant stenosis. 4. 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 5. Patent intracranial vasculature. Findings communicated to Dr. Wilford Corner at 1:15 pm. Electronically Signed   By: Theron Arista  Noone M.D.   On: 10/19/2022 13:21    IMPRESSION: Proctitis on imaging Rectal bleeding History prostate cancer Atrial fibrillation with rapid ventricular response  -Currently on heparin, plans to initiate oral anticoagulation Strokelike symptoms on admission  PLAN: -Recommend flexible sigmoidoscopy on 10/21/2022, patient in agreement -Request tapwater enema x 3 tomorrow morning, no other bowel preparation -N.p.o. at midnight for procedure -Will place order to hold heparin 6 hours prior to flexible sigmoidoscopy -Further recommendations to follow pending procedure   LOS: 1 day   Liliane Shi, DO [facility]

## 2022-10-20 NOTE — Evaluation (Signed)
Occupational Therapy Evaluation Patient Details Name: Peter Roberts MRN: [id number] DOB: 05-18-1951 Today's Date: 10/20/2022   History of Present Illness Pt is a 72 y/o male presenting on 4/29 with dizziness, chest pain and dysarthria. CT, MRI negative. Found with new onset afib with RVR. PMH includes: arthritis, carotid stenosis, hypertension, L endarterectomy.   Clinical Impression   PTA patient independent and driving. Admitted for above and presents at baseline independent level for ADLs, functional mobility and transfers. No deficits noted in cognition.  VSS during session. No further OT needs identified at this time.  OT will sign off.      Recommendations for follow up therapy are one component of a multi-disciplinary discharge planning process, led by the attending physician.  Recommendations may be updated based on patient status, additional functional criteria and insurance authorization.   Assistance Recommended at Discharge None  Patient can return home with the following      Functional Status Assessment     Equipment Recommendations  None recommended by OT    Recommendations for Other Services       Precautions / Restrictions Precautions Precautions: None Restrictions Weight Bearing Restrictions: No      Mobility Bed Mobility Overal bed mobility: Independent                  Transfers Overall transfer level: Independent                        Balance Overall balance assessment: No apparent balance deficits (not formally assessed)                                         ADL either performed or assessed with clinical judgement   ADL Overall ADL's : Independent                                             Vision   Vision Assessment?: No apparent visual deficits     Perception     Praxis      Pertinent Vitals/Pain Pain Assessment Pain Assessment: No/denies pain     Hand Dominance Right    Extremity/Trunk Assessment Upper Extremity Assessment Upper Extremity Assessment: Overall WFL for tasks assessed   Lower Extremity Assessment Lower Extremity Assessment: Defer to PT evaluation       Communication Communication Communication: No difficulties   Cognition Arousal/Alertness: Awake/alert Behavior During Therapy: WFL for tasks assessed/performed Overall Cognitive Status: Within Functional Limits for tasks assessed                                       General Comments  VSS    Exercises     Shoulder Instructions      Home Living Family/patient expects to be discharged to:: Private residence Living Arrangements: Alone Available Help at Discharge: Family;Friend(s) Type of Home: House Home Access: Stairs to enter [employer] of Steps: 2 Entrance Stairs-Rails: None Home Layout: One level     Bathroom Shower/Tub: Producer, television/film/video: Standard     Home Equipment: None          Prior Functioning/Environment Prior Level of Function : Independent/Modified Independent;Driving  OT Problem List:        OT Treatment/Interventions:      OT Goals(Current goals can be found in the care plan section) Acute Rehab OT Goals Patient Stated Goal: get back to normal OT Goal Formulation: With patient  OT Frequency:      Co-evaluation              AM-PAC OT "6 Clicks" Daily Activity     Outcome Measure Help from another person eating meals?: None Help from another person taking care of personal grooming?: None Help from another person toileting, which includes using toliet, bedpan, or urinal?: None Help from another person bathing (including washing, rinsing, drying)?: None Help from another person to put on and taking off regular upper body clothing?: None Help from another person to put on and taking off regular lower body clothing?: None 6 Click Score: 24   End of Session Nurse  Communication: Mobility status  Activity Tolerance: Patient tolerated treatment well Patient left: in bed;with call bell/phone within reach  OT Visit Diagnosis: Other abnormalities of gait and mobility (R26.89)                Time: 4098-1191 OT Time Calculation (min): 18 min Charges:  OT General Charges $OT Visit: 1 Visit OT Evaluation $OT Eval Low Complexity: 1 Low  Barry Brunner, OT Acute Rehabilitation Services Office [phone]   Chancy Milroy 10/20/2022, 10:32 AM

## 2022-10-20 NOTE — Progress Notes (Addendum)
STROKE TEAM PROGRESS NOTE   INTERVAL HISTORY His daughter is at the bedside.  MRI is negative.  Cardiology is working him up for his new onset of A-fib with RVR.  May transition to Eliquis when appropriate per primary and cardiology teams.  He did have a CEA last year with Dr. Chestine Spore. He does have recent GI bleed, GI consulted and planning for a sigmoidoscopy tomorrow.  He then transition to Eliquis if cleared by GI  Vitals:   10/20/22 0021 10/20/22 0331 10/20/22 0527 10/20/22 0715  BP: 124/73 125/62 110/68 131/76  Pulse: (!) 105 61  (!) 58  Resp: 17 16  18   Temp: 97.6 F (36.4 C) (!) 97.4 F (36.3 C)  (!) 97.4 F (36.3 C)  TempSrc: Oral Oral  Oral  SpO2: 96% 97%  98%  Weight:      Height:       CBC:  Recent Labs  Lab 10/19/22 1300  WBC 12.5*  NEUTROABS 10.1*  HGB 16.3  17.7*  HCT 50.3  52.0  MCV 87.0  PLT 268   Basic Metabolic Panel:  Recent Labs  Lab 10/19/22 1300  NA 134*  138  K 4.3  4.2  CL 101  104  CO2 21*  GLUCOSE 113*  112*  BUN 24*  26*  CREATININE 1.41*  1.40*  CALCIUM 9.6  MG 2.1   Lipid Panel:  Recent Labs  Lab 10/20/22 0103  CHOL 135  TRIG 108  HDL 54  CHOLHDL 2.5  VLDL 22  LDLCALC 59   HgbA1c:  Recent Labs  Lab 10/19/22 1300  HGBA1C 6.0*   Urine Drug Screen:  Recent Labs  Lab 10/20/22 0536  LABOPIA NONE DETECTED  COCAINSCRNUR NONE DETECTED  LABBENZ NONE DETECTED  AMPHETMU NONE DETECTED  THCU NONE DETECTED  LABBARB NONE DETECTED    Alcohol Level  Recent Labs  Lab 10/19/22 1300  ETH <10    IMAGING past 24 hours MR BRAIN WO CONTRAST  Result Date: 10/19/2022 CLINICAL DATA:  Transient ischemic attack. Neuro deficit, acute, stroke suspected. EXAM: MRI HEAD WITHOUT CONTRAST TECHNIQUE: Multiplanar, multiecho pulse sequences of the brain and surrounding structures were obtained without intravenous contrast. COMPARISON:  CTA head/neck 10/19/2022. FINDINGS: Brain: No acute infarct or hemorrhage. Moderate chronic  small-vessel disease. No hydrocephalus or extra-axial collection. No foci of abnormal susceptibility. No mass or midline shift. Vascular: Normal flow voids. Skull and upper cervical spine: Normal marrow signal. Sinuses/Orbits: Unremarkable. Other: None. IMPRESSION: 1. No acute intracranial process. 2. Moderate chronic small-vessel disease. Electronically Signed   By: Orvan Falconer M.D.   On: 10/19/2022 21:22   CT Angio Chest PE W and/or Wo Contrast  Result Date: 10/19/2022 CLINICAL DATA:  History of prostate cancer. EXAM: CT ANGIOGRAPHY CHEST WITH CONTRAST TECHNIQUE: Multidetector CT imaging of the chest was performed using the standard protocol during bolus administration of intravenous contrast. Multiplanar CT image reconstructions and MIPs were obtained to evaluate the vascular anatomy. RADIATION DOSE REDUCTION: This exam was performed according to the departmental dose-optimization program which includes automated exposure control, adjustment of the mA and/or kV according to patient size and/or use of iterative reconstruction technique. CONTRAST:  75mL OMNIPAQUE IOHEXOL 350 MG/ML SOLN COMPARISON:  None Available. FINDINGS: Cardiovascular: Significant breathing motion seen throughout the examination. This limits evaluation for pulmonary emboli. Nondiagnostic for small and peripheral emboli. No large or central embolus. Normal caliber thoracic aorta with mild calcified plaque. Coronary artery calcifications are seen. The heart is nonenlarged. No pericardial effusion. Mediastinum/Nodes:  Slightly patulous thoracic esophagus. No specific abnormal lymph node enlargement seen in the axillary region, hilum or mediastinum. There are some small lymph nodes identified in the right lung hilum which are less than a cm in short axis and not pathologic by size criteria. Lungs/Pleura: Linear opacity lung bases likely scar or atelectasis. No consolidation, pneumothorax or effusion. Upper Abdomen: Along the upper abdomen  the adrenal glands are preserved. There is some high density noted within the left kidney. Please correlate with prior CT angiogram of the head from same day Musculoskeletal: Diffuse degenerative changes of the spine. There is multilevel Schmorl's node changes with bridging osteophytes and syndesmophytes. Mild compression noted along the lower thoracic spine vertebral levels. Review of the MIP images confirms the above findings. IMPRESSION: Extensive breathing motion.  No large or central embolus. Aortic Atherosclerosis (ICD10-I70.0). Electronically Signed   By: Karen Kays M.D.   On: 10/19/2022 17:25   CT HEAD CODE STROKE WO CONTRAST  Result Date: 10/19/2022 CLINICAL DATA:  Code stroke. New onset dizziness and intermittent aphasia, resolved. EXAM: CT ANGIOGRAPHY HEAD AND NECK TECHNIQUE: Multidetector CT imaging of the head and neck was performed using the standard protocol during bolus administration of intravenous contrast. Multiplanar CT image reconstructions and MIPs were obtained to evaluate the vascular anatomy. Carotid stenosis measurements (when applicable) are obtained utilizing NASCET criteria, using the distal internal carotid diameter as the denominator. RADIATION DOSE REDUCTION: This exam was performed according to the departmental dose-optimization program which includes automated exposure control, adjustment of the mA and/or kV according to patient size and/or use of iterative reconstruction technique. CONTRAST:  75mL OMNIPAQUE IOHEXOL 350 MG/ML SOLN COMPARISON:  None Available. FINDINGS: CT HEAD FINDINGS Brain: There is no evidence of acute intracranial hemorrhage or extra-axial fluid collection. There is no evolved large vessel territorial infarct. There is hypodensity in the left corona radiata best seen in the coronal sequence which could reflect an acute infarct in the left MCA distribution (4-32). ASPECTS is 9 Background parenchymal volume is normal. The ventricles are normal in size.  Additional hypodensity in the supratentorial white matter is nonspecific but likely reflects sequela of underlying chronic small-vessel ischemic change. The pituitary and suprasellar region are normal. There is no mass lesion. There is no mass effect or midline shift. Vascular: There is calcification of the bilateral carotid siphons. Skull: Normal. Negative for fracture or focal lesion. Sinuses/Orbits: The paranasal sinuses are clear. The globes and orbits are unremarkable. Other: None. Review of the MIP images confirms the above findings CTA NECK FINDINGS Aortic arch: The imaged aortic arch is normal. The origins of the major branch vessels are patent. The subclavian arteries are patent to the level imaged. Right carotid system: The right common carotid artery is patent. There is mixed plaque of the bifurcation resulting in approximately 30-40% stenosis. The distal internal carotid artery is patent. The external carotid artery is patent. There is no evidence of dissection or aneurysm. Left carotid system: The left common carotid artery is patent. There is mild atherosclerotic irregularity at the bifurcation without significant stenosis or occlusion. The distal internal carotid artery is patent. The external carotid artery is patent. There is no evidence of dissection or aneurysm. Vertebral arteries: Plaque of the right vertebral artery origin resulting in moderate stenosis. The vertebral arteries are otherwise patent, without hemodynamically significant stenosis or occlusion. There is no evidence of dissection or aneurysm. Skeleton: There is no acute osseous abnormality or suspicious osseous lesion. Other neck: The soft tissues of the neck are  unremarkable. Upper chest: The imaged lung apices are clear. Review of the MIP images confirms the above findings CTA HEAD FINDINGS Anterior circulation: There is calcified plaque in the intracranial ICAs without greater than mild stenosis. The bilateral MCAs are patent,  without proximal stenosis or occlusion. The bilateral ACAs are patent, without proximal stenosis or occlusion. The anterior communicating artery is normal. There is no aneurysm or AVM. Posterior circulation: The bilateral V4 segments are patent. The basilar artery is patent. The major cerebellar arteries appear patent. The bilateral PCAs are patent, without proximal stenosis or occlusion. A small left posterior communicating artery is identified. There is no aneurysm or AVM. Venous sinuses: As permitted by contrast timing, patent. Anatomic variants: None. Review of the MIP images confirms the above findings IMPRESSION: 1. Possible acute to subacute infarct in the left corona radiata. Consider MRI for further evaluation. 2. No emergent vascular finding. 3. 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 Mild atherosclerotic irregularity in the left without significant stenosis. 4. 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 5. Patent intracranial vasculature. Findings communicated to Dr. Wilford Corner at 1:15 pm. Electronically Signed   By: Lesia Hausen M.D.   On: 10/19/2022 13:21   CT ANGIO HEAD NECK W WO CM (CODE STROKE)  Result Date: 10/19/2022 CLINICAL DATA:  Code stroke. New onset dizziness and intermittent aphasia, resolved. EXAM: CT ANGIOGRAPHY HEAD AND NECK TECHNIQUE: Multidetector CT imaging of the head and neck was performed using the standard protocol during bolus administration of intravenous contrast. Multiplanar CT image reconstructions and MIPs were obtained to evaluate the vascular anatomy. Carotid stenosis measurements (when applicable) are obtained utilizing NASCET criteria, using the distal internal carotid diameter as the denominator. RADIATION DOSE REDUCTION: This exam was performed according to the departmental dose-optimization program which includes automated exposure control, adjustment of the mA and/or kV according to patient size and/or  use of iterative reconstruction technique. CONTRAST:  75mL OMNIPAQUE IOHEXOL 350 MG/ML SOLN COMPARISON:  None Available. FINDINGS: CT HEAD FINDINGS Brain: There is no evidence of acute intracranial hemorrhage or extra-axial fluid collection. There is no evolved large vessel territorial infarct. There is hypodensity in the left corona radiata best seen in the coronal sequence which could reflect an acute infarct in the left MCA distribution (4-32). ASPECTS is 9 Background parenchymal volume is normal. The ventricles are normal in size. Additional hypodensity in the supratentorial white matter is nonspecific but likely reflects sequela of underlying chronic small-vessel ischemic change. The pituitary and suprasellar region are normal. There is no mass lesion. There is no mass effect or midline shift. Vascular: There is calcification of the bilateral carotid siphons. Skull: Normal. Negative for fracture or focal lesion. Sinuses/Orbits: The paranasal sinuses are clear. The globes and orbits are unremarkable. Other: None. Review of the MIP images confirms the above findings CTA NECK FINDINGS Aortic arch: The imaged aortic arch is normal. The origins of the major branch vessels are patent. The subclavian arteries are patent to the level imaged. Right carotid system: The right common carotid artery is patent. There is mixed plaque of the bifurcation resulting in approximately 30-40% stenosis. The distal internal carotid artery is patent. The external carotid artery is patent. There is no evidence of dissection or aneurysm. Left carotid system: The left common carotid artery is patent. There is mild atherosclerotic irregularity at the bifurcation without significant stenosis or occlusion. The distal internal carotid artery is patent. The external carotid artery is patent. There is no evidence  of dissection or aneurysm. Vertebral arteries: Plaque of the right vertebral artery origin resulting in moderate stenosis. The  vertebral arteries are otherwise patent, without hemodynamically significant stenosis or occlusion. There is no evidence of dissection or aneurysm. Skeleton: There is no acute osseous abnormality or suspicious osseous lesion. Other neck: The soft tissues of the neck are unremarkable. Upper chest: The imaged lung apices are clear. Review of the MIP images confirms the above findings CTA HEAD FINDINGS Anterior circulation: There is calcified plaque in the intracranial ICAs without greater than mild stenosis. The bilateral MCAs are patent, without proximal stenosis or occlusion. The bilateral ACAs are patent, without proximal stenosis or occlusion. The anterior communicating artery is normal. There is no aneurysm or AVM. Posterior circulation: The bilateral V4 segments are patent. The basilar artery is patent. The major cerebellar arteries appear patent. The bilateral PCAs are patent, without proximal stenosis or occlusion. A small left posterior communicating artery is identified. There is no aneurysm or AVM. Venous sinuses: As permitted by contrast timing, patent. Anatomic variants: None. Review of the MIP images confirms the above findings IMPRESSION: 1. Possible acute to subacute infarct in the left corona radiata. Consider MRI for further evaluation. 2. No emergent vascular finding. 3. 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 Mild atherosclerotic irregularity in the left without significant stenosis. 4. 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 5. Patent intracranial vasculature. Findings communicated to Dr. Wilford Corner at 1:15 pm. Electronically Signed   By: Lesia Hausen M.D.   On: 10/19/2022 13:21    PHYSICAL EXAM  Constitutional: Appears well-developed and well-nourished.   Cardiovascular: Normal rate and regular rhythm.  Respiratory: Effort normal, non-labored breathing  Neuro: Mental Status: Patient is awake, alert, oriented to person,  place, month, year, and situation. Patient is able to give a clear and coherent history. No signs of aphasia or neglect Cranial Nerves: II: Visual Fields are full. Pupils are equal, round, and reactive to light.   III,IV, VI: EOMI without ptosis or diploplia.  V: Facial sensation is symmetric to temperature VII: Facial movement is symmetric resting and smiling VIII: Hearing is intact to voice X: Palate elevates symmetrically XI: Shoulder shrug is symmetric. XII: Tongue protrudes midline without atrophy or fasciculations.  Motor: Tone is normal. Bulk is normal. 5/5 strength was present in all four extremities.  Sensory: Sensation is symmetric to light touch and temperature in the arms and legs. No extinction to DSS present.  Cerebellar: FNF and HKS are intact bilaterally    ASSESSMENT/PLAN Mr. Peter Roberts is a 72 y.o. male with history of prostate cancer, BPH, hypertension, hyperlipidemia, left carotid endarterectomy last year, presenting to the emergency room for evaluation of dizziness and aphasia.   TIA - left brain TIA, likely cardioembolic in the setting of new diagnosed afib  Code Stroke CT head -  Possible acute to subacute infarct in the left corona radiata.  CTA Head & Neck- Calcified plaque at the right carotid bifurcation resulting in 30-40% stenosis MRI no acute abnormality 2D Echo EF 60-65% LDL 59 HgbA1c 6.0 VTE prophylaxis -heparin IV No antithrombotic prior to admission, now on heparin IV. Plan to transition to DOAC once sigmoidoscopy done tomorrow Therapy recommendations: No follow-up recommended  Disposition: Pending  New diagnosed PAF with RVR On Heparin IV  On Diltiazem IV Cardiology on board Now in NSR Plan to transition to DOAC once sigmoidoscopy done tomorrow  Hypertension Home meds: Amlodipine Stable Long-term BP goal normotensive  Hyperlipidemia  Home meds:  Crestor 20mg , resumed in hospital LDL 59, goal < 70 Continue statin at  discharge  Other Stroke Risk Factors Advanced Age >/= 71  PVD s/p left CEA 02/2022  Other Active Problems Prostate cancer  BPH Recent GI bleed Sigmoidoscopy 5/1  Hospital day # 1  Patient seen and examined by NP/APP with MD. MD to update note as needed.   Elmer Picker, DNP, FNP-BC [facility] Pager: [phone]  ATTENDING NOTE: I reviewed above note and agree with the assessment and plan. Pt was seen and examined.   No family at bedside.  Patient lying in bed, neurologically intact, no focal deficit.  MRI no acute infarct.  Patient likely to have left brain TIA in the setting of new diagnosed A-fib RVR.  Currently on heparin IV, plan to transition to DOAC after sigmoidoscopy tomorrow.  Continue home statin.  Currently rate controlled, in NSR.  Still on IV Cardizem, recommend to transition to p.o.  Cardiology on board.  PT and OT no recommendation.  For detailed assessment and plan, please refer to above/below as I have made changes wherever appropriate.   Neurology will sign off. Please call with questions. Pt will follow up with stroke clinic NP at [facility] in about 4 weeks. Thanks for the consult.   Marvel Plan, MD PhD Stroke Neurology 10/20/2022 3:50 PM    To contact Stroke Continuity provider, please refer to [email or web address]. After hours, contact General Neurology

## 2022-10-20 NOTE — Progress Notes (Signed)
ANTICOAGULATION CONSULT NOTE - Follow Up Consult  Pharmacy Consult for heparin Indication: atrial fibrillation  Labs: Recent Labs    10/19/22 1300 10/20/22 0103  HGB 16.3  17.7*  --   HCT 50.3  52.0  --   PLT 268  --   APTT 31  --   LABPROT 14.2  --   INR 1.1  --   HEPARINUNFRC  --  <0.10*  CREATININE 1.41*  1.40*  --     Assessment: 72yo male subtherapeutic on heparin with initial dosing for new Afib; MRI negative for acute intracranial process; no infusion issues or signs of bleeding per RN.  Goal of Therapy:  Heparin level 0.3-0.5 units/ml   Plan:  Increase heparin infusion by 3 units/kg/hr to 1250 units/hr. Check level in 6-8 hours.   Vernard Gambles, PharmD, BCPS 10/20/2022 2:43 AM

## 2022-10-20 NOTE — Consult Note (Signed)
[facility] Consult  Referring Provider: No ref. provider found Primary Care Physician:  Reade, Robert, MD (Inactive) Primary Gastroenterologist: Dr. Schooler  Reason for Consultation: Rectal bleeding  SUBJECTIVE:   HPI: Peter Roberts is a 71 y.o. male with past medical history significant for, hyperlipidemia and prostate cancer diagnosed in November 2023.  CT imaging of abdomen and pelvis on 08/23/2022 showed rectal wall thickening and surrounding fat stranding compatible with proctitis.  He was planning to have flexible sigmoidoscopy with Dr. Schooler in the outpatient setting.  He has not received radiation for prostate cancer.  He presented the hospital on 10/19/2022 with strokelike symptoms, dizziness and aphasia.  CT imaging showed possible acute to subacute left corona radiata infarct, MRI brain showed no acute intracranial process, moderate chronic small vessel disease.  He was found to have atrial fibrillation with rapid ventricular response on presentation, was started on Cardizem with plans to transition to oral anticoagulant.  Prior to initiating anticoagulation, gastroenterology was consulted to request flexible sigmoidoscopy.  Patient denied current rectal bleeding, noted that the last rectal bleeding he experienced was in March.  He has no abdominal pain.  No nausea or vomiting.  No chest pain or shortness of breath.  He is agreeable to flexible sigmoidoscopy on 10/21/2022.  Past Medical History:  Diagnosis Date   Arthritis    BPH associated with nocturia    Carotid stenosis, asymptomatic, bilateral 2017   followed by pcp and vascular dr clark;    s/p left CEA 02-25-2022 for stenosis >80%; (post op note in epic 03-17-2022 healing well)   last duplex in epic 02-03-2022 right ICA 40-59% and left ICA 80-99%   Elevated blood pressure reading without diagnosis of hypertension    Family history of prostate cancer    History of anal fissures    Hyperlipidemia    Malignant  neoplasm prostate (HCC) 04/2022   urologist--- dr newsome/  radiation oncologist--- dr manning;  dx 11/ 2023,  Gleason 4+3   Microcytic anemia    Past Surgical History:  Procedure Laterality Date   CARPAL TUNNEL RELEASE Left 12/2021   COLONOSCOPY  2019   ENDARTERECTOMY Left 02/25/2022   Procedure: LEFT CAROTID ENDARTERECTOMY;  Surgeon: Clark, Christopher J, MD;  Location: [facility] OR;  Service: Vascular;  Laterality: Left;   KNEE ARTHROSCOPY Right 2002   Prior to Admission medications   Medication Sig Start Date End Date Taking? Authorizing Provider  amLODipine (NORVASC) 5 MG tablet Take 1 tablet (5 mg total) by mouth daily. 08/25/22 08/25/23  Yates, Jennifer, MD  Cholecalciferol (VITAMIN D3) 125 MCG (5000 UT) capsule Take 5,000 Units by mouth daily.    [provider]  diclofenac Sodium (VOLTAREN) 1 % GEL Apply 4 g topically 4 (four) times daily as needed (pain).    [provider]  Docusate Calcium (STOOL SOFTENER PO) Take 1 capsule by mouth daily as needed (constipation).    [provider]  Ferrous Sulfate (IRON) 90 (18 Fe) MG TABS Take 90 mg by mouth every evening.    [provider]  Magnesium 500 MG CAPS Take 1 capsule by mouth every evening.    [provider]  Oil of Oregano 1500 MG CAPS Take 1 capsule by mouth daily.    [provider]  OVER THE COUNTER MEDICATION Take 3 capsules by mouth daily after lunch. Prostagenix    [provider]  OVER THE COUNTER MEDICATION Take 1 tablet by mouth daily. Circ O2    [provider]    Probiotic Product (PROBIOTIC DAILY) CAPS Take 1 capsule by mouth daily.    [provider]  rosuvastatin (CRESTOR) 20 MG tablet Take 1 tablet (20 mg total) by mouth every evening. Patient taking differently: Take 20 mg by mouth every evening. 02/26/22   Clark, Christopher J, MD   Current Facility-Administered Medications  Medication Dose Route Frequency Provider Last Rate Last Admin   0.9 %   sodium chloride infusion   Intravenous Continuous Pahwani, Ravi, MD 75 mL/hr at 10/20/22 0900 Infusion Verify at 10/20/22 0900   acetaminophen (TYLENOL) tablet 650 mg  650 mg Oral Q4H PRN Pahwani, Ravi, MD       Or   acetaminophen (TYLENOL) 160 MG/5ML solution 650 mg  650 mg Per Tube Q4H PRN Pahwani, Ravi, MD       Or   acetaminophen (TYLENOL) suppository 650 mg  650 mg Rectal Q4H PRN Pahwani, Ravi, MD       diltiazem (CARDIZEM) 125 mg in dextrose 5% 125 mL (1 mg/mL) infusion  5-15 mg/hr Intravenous Continuous Naasz, Hayley N, MD 5 mL/hr at 10/20/22 0800 5 mg/hr at 10/20/22 0800   docusate sodium (COLACE) capsule 100 mg  100 mg Oral BID Pahwani, Ravi, MD   100 mg at 10/19/22 2141   heparin ADULT infusion 100 units/mL (25000 units/250mL)  1,350 Units/hr Intravenous Continuous Tucker, Mary-Ashlyn, RPH 13.5 mL/hr at 10/20/22 1219 1,350 Units/hr at 10/20/22 1219   rosuvastatin (CRESTOR) tablet 20 mg  20 mg Oral QPM Pahwani, Ravi, MD   20 mg at 10/19/22 2141   sodium chloride flush (NS) 0.9 % injection 3 mL  3 mL Intravenous Once Naasz, Hayley N, MD       Allergies as of 10/19/2022 - Reviewed 10/19/2022  Allergen Reaction Noted   Sulfa antibiotics Hives 02/16/2022   Family History  Problem Relation Age of Onset   Lung disease Mother    Heart attack Father    Heart disease Father    Social History   Socioeconomic History   Marital status: Widowed    Spouse name: Not on file   Number of children: Not on file   Years of education: Not on file   Highest education level: Not on file  Occupational History   Not on file  Tobacco Use   Smoking status: Never    Passive exposure: Never   Smokeless tobacco: Never  Vaping Use   Vaping Use: Never used  Substance and Sexual Activity   Alcohol use: No   Drug use: Never   Sexual activity: Not on file  Other Topics Concern   Not on file  Social History Narrative   Not on file   Social Determinants of Health   Financial Resource Strain: Not  on file  Food Insecurity: No Food Insecurity (10/19/2022)   Hunger Vital Sign    Worried About Running Out of Food in the Last Year: Never true    Ran Out of Food in the Last Year: Never true  Transportation Needs: No Transportation Needs (10/19/2022)   PRAPARE - Transportation    Lack of Transportation (Medical): No    Lack of Transportation (Non-Medical): No  Physical Activity: Not on file  Stress: Not on file  Social Connections: Not on file  Intimate Partner Violence: Not At Risk (10/19/2022)   Humiliation, Afraid, Rape, and Kick questionnaire    Fear of Current or Ex-Partner: No    Emotionally Abused: No    Physically Abused: No    Sexually Abused: No     Review of Systems:  Review of Systems  Respiratory:  Negative for shortness of breath.   Cardiovascular:  Negative for chest pain.  Gastrointestinal:  Negative for abdominal pain, blood in stool, nausea and vomiting.    OBJECTIVE:   Temp:  [97.4 F (36.3 C)-98.3 F (36.8 C)] 97.4 F (36.3 C) (04/30 0715) Pulse Rate:  [32-132] 58 (04/30 0715) Resp:  [13-28] 18 (04/30 0715) BP: (109-150)/(62-100) 131/76 (04/30 0715) SpO2:  [96 %-100 %] 98 % (04/30 0715) Last BM Date : 10/19/22 Physical Exam Constitutional:      General: He is not in acute distress.    Appearance: He is not ill-appearing, toxic-appearing or diaphoretic.  Cardiovascular:     Rate and Rhythm: Normal rate and regular rhythm.  Pulmonary:     Effort: No respiratory distress.     Breath sounds: Normal breath sounds.  Abdominal:     General: Bowel sounds are normal. There is no distension.     Palpations: Abdomen is soft.     Tenderness: There is no abdominal tenderness. There is no guarding.  Musculoskeletal:     Right lower leg: No edema.     Left lower leg: No edema.  Neurological:     Mental Status: He is alert.     Labs: Recent Labs    10/19/22 1300  WBC 12.5*  HGB 16.3  17.7*  HCT 50.3  52.0  PLT 268   BMET Recent Labs     10/19/22 1300  NA 134*  138  K 4.3  4.2  CL 101  104  CO2 21*  GLUCOSE 113*  112*  BUN 24*  26*  CREATININE 1.41*  1.40*  CALCIUM 9.6   LFT Recent Labs    10/19/22 1300  PROT 7.9  ALBUMIN 4.3  AST 37  ALT 37  ALKPHOS 78  BILITOT 0.9   PT/INR Recent Labs    10/19/22 1300  LABPROT 14.2  INR 1.1    Diagnostic imaging: ECHOCARDIOGRAM COMPLETE  Result Date: 10/20/2022    ECHOCARDIOGRAM REPORT   Patient Name:   Payden Hogeland Date of Exam: 10/20/2022 Medical Rec #:  [id number]    Height:       67.0 in Accession #:    [id number]   Weight:       183.0 lb Date of Birth:  09/13/1950    BSA:          1.947 m Patient Age:    71 years     BP:           131/76 mmHg Patient Gender: M            HR:           58 bpm. Exam Location:  Inpatient Procedure: 2D Echo, Cardiac Doppler, Color Doppler and Intracardiac            Opacification Agent Indications:    Stroke.  History:        Patient has no prior history of Echocardiogram examinations.                 Abnormal ECG, Stroke, Arrythmias:Atrial Fibrillation;                 Signs/Symptoms:Chest Pain.  Sonographer:    Tina West RDCS Referring Phys: [id number] RAVI PAHWANI  Sonographer Comments: Suboptimal apical window. Paused for stat lab draw IMPRESSIONS  1. Left ventricular ejection fraction, by estimation, is 60 to 65%. The left ventricle has normal function. The left   ventricle has no regional wall motion abnormalities. Left ventricular diastolic parameters were normal.  2. Right ventricular systolic function is normal. The right ventricular size is normal.  3. The mitral valve is normal in structure. Trivial mitral valve regurgitation. No evidence of mitral stenosis.  4. The aortic valve was not well visualized. Aortic valve regurgitation is not visualized. No aortic stenosis is present.  5. The inferior vena cava is normal in size with greater than 50% respiratory variability, suggesting right atrial pressure of 3 mmHg. Comparison(s): No prior  Echocardiogram. FINDINGS  Left Ventricle: Left ventricular ejection fraction, by estimation, is 60 to 65%. The left ventricle has normal function. The left ventricle has no regional wall motion abnormalities. Definity contrast agent was given IV to delineate the left ventricular  endocardial borders. The left ventricular internal cavity size was normal in size. There is no left ventricular hypertrophy. Left ventricular diastolic parameters were normal. Right Ventricle: The right ventricular size is normal. No increase in right ventricular wall thickness. Right ventricular systolic function is normal. Left Atrium: Left atrial size was normal in size. Right Atrium: Right atrial size was normal in size. Pericardium: There is no evidence of pericardial effusion. Mitral Valve: The mitral valve is normal in structure. Trivial mitral valve regurgitation. No evidence of mitral valve stenosis. Tricuspid Valve: The tricuspid valve is normal in structure. Tricuspid valve regurgitation is trivial. No evidence of tricuspid stenosis. Aortic Valve: The aortic valve was not well visualized. Aortic valve regurgitation is not visualized. No aortic stenosis is present. Pulmonic Valve: The pulmonic valve was normal in structure. Pulmonic valve regurgitation is not visualized. No evidence of pulmonic stenosis. Aorta: The aortic root and ascending aorta are structurally normal, with no evidence of dilitation. Venous: The inferior vena cava is normal in size with greater than 50% respiratory variability, suggesting right atrial pressure of 3 mmHg. IAS/Shunts: No atrial level shunt detected by color flow Doppler.  LEFT VENTRICLE PLAX 2D LVIDd:         4.30 cm   Diastology LVIDs:         2.80 cm   LV e' medial:    8.27 cm/s LV PW:         0.90 cm   LV E/e' medial:  9.4 LV IVS:        1.00 cm   LV e' lateral:   10.40 cm/s LVOT diam:     2.40 cm   LV E/e' lateral: 7.5 LV SV:         88 LV SV Index:   45 LVOT Area:     4.52 cm  RIGHT  VENTRICLE             IVC RV S prime:     14.60 cm/s  IVC diam: 1.60 cm TAPSE (M-mode): 2.2 cm LEFT ATRIUM             Index        RIGHT ATRIUM           Index LA diam:        3.90 cm 2.00 cm/m   RA Area:     15.80 cm LA Vol (A2C):   42.6 ml 21.88 ml/m  RA Volume:   34.60 ml  17.77 ml/m LA Vol (A4C):   27.0 ml 13.87 ml/m LA Biplane Vol: 33.7 ml 17.31 ml/m  AORTIC VALVE LVOT Vmax:   90.70 cm/s LVOT Vmean:  56.600 cm/s LVOT VTI:    0.195 m  AORTA Ao   Root diam: 2.90 cm Ao Asc diam:  3.10 cm MITRAL VALVE MV Area (PHT): 3.42 cm    SHUNTS MV Decel Time: 222 msec    Systemic VTI:  0.20 m MV E velocity: 78.10 cm/s  Systemic Diam: 2.40 cm MV A velocity: 81.20 cm/s MV E/A ratio:  0.96 Mahesh Chandrasekhar MD Electronically signed by Mahesh Chandrasekhar MD Signature Date/Time: 10/20/2022/1:21:47 PM    Final    MR BRAIN WO CONTRAST  Result Date: 10/19/2022 CLINICAL DATA:  Transient ischemic attack. Neuro deficit, acute, stroke suspected. EXAM: MRI HEAD WITHOUT CONTRAST TECHNIQUE: Multiplanar, multiecho pulse sequences of the brain and surrounding structures were obtained without intravenous contrast. COMPARISON:  CTA head/neck 10/19/2022. FINDINGS: Brain: No acute infarct or hemorrhage. Moderate chronic small-vessel disease. No hydrocephalus or extra-axial collection. No foci of abnormal susceptibility. No mass or midline shift. Vascular: Normal flow voids. Skull and upper cervical spine: Normal marrow signal. Sinuses/Orbits: Unremarkable. Other: None. IMPRESSION: 1. No acute intracranial process. 2. Moderate chronic small-vessel disease. Electronically Signed   By: Walter  Wiggins M.D.   On: 10/19/2022 21:22   CT Angio Chest PE W and/or Wo Contrast  Result Date: 10/19/2022 CLINICAL DATA:  History of prostate cancer. EXAM: CT ANGIOGRAPHY CHEST WITH CONTRAST TECHNIQUE: Multidetector CT imaging of the chest was performed using the standard protocol during bolus administration of intravenous contrast. Multiplanar  CT image reconstructions and MIPs were obtained to evaluate the vascular anatomy. RADIATION DOSE REDUCTION: This exam was performed according to the departmental dose-optimization program which includes automated exposure control, adjustment of the mA and/or kV according to patient size and/or use of iterative reconstruction technique. CONTRAST:  75mL OMNIPAQUE IOHEXOL 350 MG/ML SOLN COMPARISON:  None Available. FINDINGS: Cardiovascular: Significant breathing motion seen throughout the examination. This limits evaluation for pulmonary emboli. Nondiagnostic for small and peripheral emboli. No large or central embolus. Normal caliber thoracic aorta with mild calcified plaque. Coronary artery calcifications are seen. The heart is nonenlarged. No pericardial effusion. Mediastinum/Nodes: Slightly patulous thoracic esophagus. No specific abnormal lymph node enlargement seen in the axillary region, hilum or mediastinum. There are some small lymph nodes identified in the right lung hilum which are less than a cm in short axis and not pathologic by size criteria. Lungs/Pleura: Linear opacity lung bases likely scar or atelectasis. No consolidation, pneumothorax or effusion. Upper Abdomen: Along the upper abdomen the adrenal glands are preserved. There is some high density noted within the left kidney. Please correlate with prior CT angiogram of the head from same day Musculoskeletal: Diffuse degenerative changes of the spine. There is multilevel Schmorl's node changes with bridging osteophytes and syndesmophytes. Mild compression noted along the lower thoracic spine vertebral levels. Review of the MIP images confirms the above findings. IMPRESSION: Extensive breathing motion.  No large or central embolus. Aortic Atherosclerosis (ICD10-I70.0). Electronically Signed   By: Ashok  Gupta M.D.   On: 10/19/2022 17:25   CT HEAD CODE STROKE WO CONTRAST  Result Date: 10/19/2022 CLINICAL DATA:  Code stroke. New onset dizziness and  intermittent aphasia, resolved. EXAM: CT ANGIOGRAPHY HEAD AND NECK TECHNIQUE: Multidetector CT imaging of the head and neck was performed using the standard protocol during bolus administration of intravenous contrast. Multiplanar CT image reconstructions and MIPs were obtained to evaluate the vascular anatomy. Carotid stenosis measurements (when applicable) are obtained utilizing NASCET criteria, using the distal internal carotid diameter as the denominator. RADIATION DOSE REDUCTION: This exam was performed according to the departmental dose-optimization program which includes automated exposure control, adjustment of the   mA and/or kV according to patient size and/or use of iterative reconstruction technique. CONTRAST:  75mL OMNIPAQUE IOHEXOL 350 MG/ML SOLN COMPARISON:  None Available. FINDINGS: CT HEAD FINDINGS Brain: There is no evidence of acute intracranial hemorrhage or extra-axial fluid collection. There is no evolved large vessel territorial infarct. There is hypodensity in the left corona radiata best seen in the coronal sequence which could reflect an acute infarct in the left MCA distribution (4-32). ASPECTS is 9 Background parenchymal volume is normal. The ventricles are normal in size. Additional hypodensity in the supratentorial white matter is nonspecific but likely reflects sequela of underlying chronic small-vessel ischemic change. The pituitary and suprasellar region are normal. There is no mass lesion. There is no mass effect or midline shift. Vascular: There is calcification of the bilateral carotid siphons. Skull: Normal. Negative for fracture or focal lesion. Sinuses/Orbits: The paranasal sinuses are clear. The globes and orbits are unremarkable. Other: None. Review of the MIP images confirms the above findings CTA NECK FINDINGS Aortic arch: The imaged aortic arch is normal. The origins of the major branch vessels are patent. The subclavian arteries are patent to the level imaged. Right carotid  system: The right common carotid artery is patent. There is mixed plaque of the bifurcation resulting in approximately 30-40% stenosis. The distal internal carotid artery is patent. The external carotid artery is patent. There is no evidence of dissection or aneurysm. Left carotid system: The left common carotid artery is patent. There is mild atherosclerotic irregularity at the bifurcation without significant stenosis or occlusion. The distal internal carotid artery is patent. The external carotid artery is patent. There is no evidence of dissection or aneurysm. Vertebral arteries: Plaque of the right vertebral artery origin resulting in moderate stenosis. The vertebral arteries are otherwise patent, without hemodynamically significant stenosis or occlusion. There is no evidence of dissection or aneurysm. Skeleton: There is no acute osseous abnormality or suspicious osseous lesion. Other neck: The soft tissues of the neck are unremarkable. Upper chest: The imaged lung apices are clear. Review of the MIP images confirms the above findings CTA HEAD FINDINGS Anterior circulation: There is calcified plaque in the intracranial ICAs without greater than mild stenosis. The bilateral MCAs are patent, without proximal stenosis or occlusion. The bilateral ACAs are patent, without proximal stenosis or occlusion. The anterior communicating artery is normal. There is no aneurysm or AVM. Posterior circulation: The bilateral V4 segments are patent. The basilar artery is patent. The major cerebellar arteries appear patent. The bilateral PCAs are patent, without proximal stenosis or occlusion. A small left posterior communicating artery is identified. There is no aneurysm or AVM. Venous sinuses: As permitted by contrast timing, patent. Anatomic variants: None. Review of the MIP images confirms the above findings IMPRESSION: 1. Possible acute to subacute infarct in the left corona radiata. Consider MRI for further evaluation. 2. No  emergent vascular finding. 3. 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 Mild atherosclerotic irregularity in the left without significant stenosis. 4. 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 5. Patent intracranial vasculature. Findings communicated to Dr. Arora at 1:15 pm. Electronically Signed   By: Peter  Noone M.D.   On: 10/19/2022 13:21   CT ANGIO HEAD NECK W WO CM (CODE STROKE)  Result Date: 10/19/2022 CLINICAL DATA:  Code stroke. New onset dizziness and intermittent aphasia, resolved. EXAM: CT ANGIOGRAPHY HEAD AND NECK TECHNIQUE: Multidetector CT imaging of the head and neck was performed using the standard protocol during bolus   administration of intravenous contrast. Multiplanar CT image reconstructions and MIPs were obtained to evaluate the vascular anatomy. Carotid stenosis measurements (when applicable) are obtained utilizing NASCET criteria, using the distal internal carotid diameter as the denominator. RADIATION DOSE REDUCTION: This exam was performed according to the departmental dose-optimization program which includes automated exposure control, adjustment of the mA and/or kV according to patient size and/or use of iterative reconstruction technique. CONTRAST:  75mL OMNIPAQUE IOHEXOL 350 MG/ML SOLN COMPARISON:  None Available. FINDINGS: CT HEAD FINDINGS Brain: There is no evidence of acute intracranial hemorrhage or extra-axial fluid collection. There is no evolved large vessel territorial infarct. There is hypodensity in the left corona radiata best seen in the coronal sequence which could reflect an acute infarct in the left MCA distribution (4-32). ASPECTS is 9 Background parenchymal volume is normal. The ventricles are normal in size. Additional hypodensity in the supratentorial white matter is nonspecific but likely reflects sequela of underlying chronic small-vessel ischemic change. The pituitary and suprasellar region are  normal. There is no mass lesion. There is no mass effect or midline shift. Vascular: There is calcification of the bilateral carotid siphons. Skull: Normal. Negative for fracture or focal lesion. Sinuses/Orbits: The paranasal sinuses are clear. The globes and orbits are unremarkable. Other: None. Review of the MIP images confirms the above findings CTA NECK FINDINGS Aortic arch: The imaged aortic arch is normal. The origins of the major branch vessels are patent. The subclavian arteries are patent to the level imaged. Right carotid system: The right common carotid artery is patent. There is mixed plaque of the bifurcation resulting in approximately 30-40% stenosis. The distal internal carotid artery is patent. The external carotid artery is patent. There is no evidence of dissection or aneurysm. Left carotid system: The left common carotid artery is patent. There is mild atherosclerotic irregularity at the bifurcation without significant stenosis or occlusion. The distal internal carotid artery is patent. The external carotid artery is patent. There is no evidence of dissection or aneurysm. Vertebral arteries: Plaque of the right vertebral artery origin resulting in moderate stenosis. The vertebral arteries are otherwise patent, without hemodynamically significant stenosis or occlusion. There is no evidence of dissection or aneurysm. Skeleton: There is no acute osseous abnormality or suspicious osseous lesion. Other neck: The soft tissues of the neck are unremarkable. Upper chest: The imaged lung apices are clear. Review of the MIP images confirms the above findings CTA HEAD FINDINGS Anterior circulation: There is calcified plaque in the intracranial ICAs without greater than mild stenosis. The bilateral MCAs are patent, without proximal stenosis or occlusion. The bilateral ACAs are patent, without proximal stenosis or occlusion. The anterior communicating artery is normal. There is no aneurysm or AVM. Posterior  circulation: The bilateral V4 segments are patent. The basilar artery is patent. The major cerebellar arteries appear patent. The bilateral PCAs are patent, without proximal stenosis or occlusion. A small left posterior communicating artery is identified. There is no aneurysm or AVM. Venous sinuses: As permitted by contrast timing, patent. Anatomic variants: None. Review of the MIP images confirms the above findings IMPRESSION: 1. Possible acute to subacute infarct in the left corona radiata. Consider MRI for further evaluation. 2. No emergent vascular finding. 3. 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 Mild atherosclerotic irregularity in the left without significant stenosis. 4. 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 5. Patent intracranial vasculature. Findings communicated to Dr. Arora at 1:15 pm. Electronically Signed   By: Peter    Noone M.D.   On: 10/19/2022 13:21    IMPRESSION: Proctitis on imaging Rectal bleeding History prostate cancer Atrial fibrillation with rapid ventricular response  -Currently on heparin, plans to initiate oral anticoagulation Strokelike symptoms on admission  PLAN: -Recommend flexible sigmoidoscopy on 10/21/2022, patient in agreement -Request tapwater enema x 3 tomorrow morning, no other bowel preparation -N.p.o. at midnight for procedure -Will place order to hold heparin 6 hours prior to flexible sigmoidoscopy -Further recommendations to follow pending procedure   LOS: 1 day   Ferrin Liebig, DO [facility]    

## 2022-10-20 NOTE — Evaluation (Signed)
Speech Language Pathology Evaluation Patient Details Name: Peter Roberts MRN: [id number] DOB: [birth year] Today's Date: 10/20/2022 Time: 6295-2841 SLP Time Calculation (min) (ACUTE ONLY): 10 min  Problem List:  Patient Active Problem List   Diagnosis Date Noted   Cerebrovascular accident (CVA) (HCC) 10/19/2022   Atrial fibrillation with RVR (HCC) 10/19/2022   Chest pain of uncertain etiology 10/19/2022   Chronic kidney disease, stage 3a (HCC) 08/25/2022   Primary hypertension 08/25/2022   Proctitis 08/23/2022   Rectal bleed 08/23/2022   Hyponatremia 08/23/2022   Prostate cancer (HCC) 08/03/2022   Asymptomatic carotid artery stenosis without infarction, left 02/25/2022   Carotid stenosis 02/03/2022   Encounter for orthopedic follow-up care 01/12/2022   Carpal tunnel syndrome 10/27/2021   Pain in right hand 10/27/2021   Pain of left hand 10/27/2021   Past Medical History:  Past Medical History:  Diagnosis Date   Arthritis    BPH associated with nocturia    Carotid stenosis, asymptomatic, bilateral 2017   followed by pcp and vascular dr Chestine Spore;    s/p left CEA 02-25-2022 for stenosis >80%; (post op note in epic 03-17-2022 healing well)   last duplex in epic 02-03-2022 right ICA 40-59% and left ICA 80-99%   Elevated blood pressure reading without diagnosis of hypertension    Family history of prostate cancer    History of anal fissures    Hyperlipidemia    Malignant neoplasm prostate (HCC) 04/2022   urologist--- dr newsome/  radiation oncologist--- dr Kathrynn Running;  dx 11/ 2023,  Gleason 4+3   Microcytic anemia    Past Surgical History:  Past Surgical History:  Procedure Laterality Date   CARPAL TUNNEL RELEASE Left 12/2021   COLONOSCOPY  2019   ENDARTERECTOMY Left 02/25/2022   Procedure: LEFT CAROTID ENDARTERECTOMY;  Surgeon: Cephus Shelling, MD;  Location: [facility] OR;  Service: Vascular;  Laterality: Left;   KNEE ARTHROSCOPY Right 2002   HPI:  Peter Roberts is a 72 y.o. male  with medical history significant of hypertension, hyperlipidemia recent diagnosis of prostate cancer in November admittted for chest pain, dizziness and difficulty speaking.   No acute intracranial process.  2. Moderate chronic small-vessel disease.   Assessment / Plan / Recommendation Clinical Impression  Pt, who is retired seen for speech-language-cognitive evaluation. He feels that his speech is back to baseline and no difficulties with cognition.Oromotor exam was unremarkable. Speech is 100% intelligible. He was able to comprehend and express his thoughts without difficulty. Pt scored a  26/30 on the SLUMS that is is the mild impairment, losing points in memory and paragraph recall. Overall, his cognition appears within functional limits and discussed ways to facilitate memory if needed. No further ST needed.    SLP Assessment  SLP Recommendation/Assessment: Patient does not need any further Speech Lanaguage Pathology Services SLP Visit Diagnosis: Cognitive communication deficit (R41.841)    Recommendations for follow up therapy are one component of a multi-disciplinary discharge planning process, led by the attending physician.  Recommendations may be updated based on patient status, additional functional criteria and insurance authorization.    Follow Up Recommendations  No SLP follow up    Assistance Recommended at Discharge  None  Functional Status Assessment    Frequency and Duration           SLP Evaluation Cognition  Overall Cognitive Status: Within Functional Limits for tasks assessed Orientation Level: Oriented X4       Comprehension  Auditory Comprehension Overall Auditory Comprehension: Appears within functional limits for tasks  assessed Visual Recognition/Discrimination Discrimination: Not tested Reading Comprehension Reading Status: Not tested    Expression Expression Primary Mode of Expression: Verbal Verbal Expression Overall Verbal Expression: Appears within  functional limits for tasks assessed Written Expression Dominant Hand: Right Written Expression: Not tested   Oral / Motor  Oral Motor/Sensory Function Overall Oral Motor/Sensory Function: Within functional limits Motor Speech Overall Motor Speech: Appears within functional limits for tasks assessed            Peter Roberts 10/20/2022, 9:11 AM

## 2022-10-20 NOTE — Progress Notes (Signed)
Bilateral PIV's in [facility] are 18g. No need for additional access for CTA.   Ingra Rother Loyola Mast, RN

## 2022-10-20 NOTE — Progress Notes (Signed)
PROGRESS NOTE    Peter Roberts  ZOX:[id number] DOB: 1950-10-19 DOA: 10/19/2022 PCP: Elias Else, MD (Inactive)   Brief Narrative:  HPI: Peter Roberts is a 72 y.o. male with medical history significant of hypertension, hyperlipidemia recent diagnosis of prostate cancer in November and multiple other comorbidities was brought into the emergency department with several complaints.  History was mostly obtained by the daughter who is at bedside.  According to her, scheduled to have sigmoidoscopy today so he has been n.p.o. since last night, this morning before going for sigmoidoscopy, he decided to take a walk and while he was doing that, he started having chest pressure and decided to go back home, when he arrived home, he felt dizziness.  He called his daughter and his son-in-law went to check on him.  This was around 11:45 AM.  EMS was called and when EMS arrived, patient started having some difficulty speaking.  Per daughter, this lasted only about half an hour and by the time patient arrived to the ED, his speech was back to normal.  Patient never had any deficit or weakness to any part of the body.  Per daughter, EMS also noted some facial droop but that also disappeared upon arrival to the ED.  No other complaint.  No recent history of TAVR, and history of sick contact and no previous history of thromboembolism.  Per daughter, he was going to get sigmoidoscopy due to him having some rectal bleeding and March and he was going to start treatment, either radiation or chemo for his prostate cancer.  No history of fever, chills, sweating, abdominal pain, any problem with urination or bowel movement.   ED Course: Upon arrival to ED, patient was in atrial fibrillation with slightly elevated blood pressure with mild tachycardia.  CBC shows mild leukocytosis.  BMP showed CKD stage IIIa at his baseline.  Mild hyponatremia.  Due to stroke features, code stroke was called, patient underwent CT head as well as CT  angio head and neck and was found to have "Possible acute to subacute infarct in the left corona radiata".  MRI recommended.  Neurology consulted.  Patient was also started on heparin drip as well as diltiazem drip.  Since patient's symptoms started to improve, thrombolytics/tPA were not recommended by neurology.  Assessment & Plan:   Principal Problem:   Cerebrovascular accident (CVA) (HCC) Active Problems:   Asymptomatic carotid artery stenosis without infarction, left   Chronic kidney disease, stage 3a (HCC)   Primary hypertension   Atrial fibrillation with RVR (HCC)   Chest pain of uncertain etiology  Strokelike symptoms but stroke ruled out/TIA: Interestingly, patient's initial CT head and CT angiogram showed left coronary radiator acute ischemic stroke but MRI ruled out stroke.  Neurology on board.  Patient has no deficit.  No problem with speaking or any facial droop.  No problem swallowing.  Seen by neurology.  He is cleared from their standpoint.  LDL within range so Crestor was resumed back to 20 mg his previous home dose.  Transthoracic echo pending.     New onset atrial fibrillation with RVR: Patient has been started on a heparin drip, he was also started on Cardizem drip, has converted to sinus rhythm this morning.  I saw this patient with cardiology, they are planning to transition him to metoprolol.  Due to high CHA2DS2-VASc score, cardiology had recommended that he should not be off of anticoagulation for too long and thus they recommended that his sigmoidoscopy should be considered to be  done inpatient and recommended GI consultation.    Recent history of GI bleed: Patient was scheduled to have sigmoidoscopy yesterday.  Per cardiology recommendations, we have consulted GI however they are planning to do a sigmoidoscopy tomorrow morning.  We will continue him on heparin for now and if all goes well, will be transition to Eliquis after sigmoidoscopy tomorrow.  Chest  pressure/pain/demand ischemia: No more chest pain.  CTA ruled out PE.  Troponins elevated but flat.  Per cardiology, this is demand ischemia.  Echo is pending.     History of prostate cancer: Follow-up with urology and oncology as outpatient.  DVT prophylaxis: Heparin   Code Status: Full Code  Family Communication:  None present at bedside.  Plan of care discussed with patient in length and he/she verbalized understanding and agreed with it.  Status is: Inpatient Remains inpatient appropriate because: Patient is scheduled for sigmoidoscopy tomorrow morning.   Estimated body mass index is 28.66 kg/m as calculated from the following:   Height as of this encounter: 5\' 7"  (1.702 m).   Weight as of this encounter: 83 kg.    Nutritional Assessment: Body mass index is 28.66 kg/m.Marland Kitchen Seen by dietician.  I agree with the assessment and plan as outlined below: Nutrition Status:        . Skin Assessment: I have examined the patient's skin and I agree with the wound assessment as performed by the wound care RN as outlined below:    Consultants:  Cardiology, GI and neurology  Procedures:  As above  Antimicrobials:  Anti-infectives (From admission, onward)    None         Subjective: Patient seen and examined.  He has no complaints.  Objective: Vitals:   10/20/22 0021 10/20/22 0331 10/20/22 0527 10/20/22 0715  BP: 124/73 125/62 110/68 131/76  Pulse: (!) 105 61  (!) 58  Resp: 17 16  18   Temp: 97.6 F (36.4 C) (!) 97.4 F (36.3 C)  (!) 97.4 F (36.3 C)  TempSrc: Oral Oral  Oral  SpO2: 96% 97%  98%  Weight:      Height:        Intake/Output Summary (Last 24 hours) at 10/20/2022 1133 Last data filed at 10/20/2022 0800 Gross per 24 hour  Intake 1623.21 ml  Output 400 ml  Net 1223.21 ml   Filed Weights   10/19/22 1200 10/19/22 1325  Weight: 83.8 kg 83 kg    Examination:  General exam: Appears calm and comfortable  Respiratory system: Clear to auscultation.  Respiratory effort normal. Cardiovascular system: S1 & S2 heard, RRR. No JVD, murmurs, rubs, gallops or clicks. No pedal edema. Gastrointestinal system: Abdomen is nondistended, soft and nontender. No organomegaly or masses felt. Normal bowel sounds heard. Central nervous system: Alert and oriented. No focal neurological deficits. Extremities: Symmetric 5 x 5 power. Skin: No rashes, lesions or ulcers Psychiatry: Judgement and insight appear normal. Mood & affect appropriate.    Data Reviewed: I have personally reviewed following labs and imaging studies  CBC: Recent Labs  Lab 10/19/22 1300  WBC 12.5*  NEUTROABS 10.1*  HGB 16.3  17.7*  HCT 50.3  52.0  MCV 87.0  PLT 268   Basic Metabolic Panel: Recent Labs  Lab 10/19/22 1300  NA 134*  138  K 4.3  4.2  CL 101  104  CO2 21*  GLUCOSE 113*  112*  BUN 24*  26*  CREATININE 1.41*  1.40*  CALCIUM 9.6  MG 2.1   GFR:  Estimated Creatinine Clearance: 51 mL/min (A) (by C-G formula based on SCr of 1.41 mg/dL (H)). Liver Function Tests: Recent Labs  Lab 10/19/22 1300  AST 37  ALT 37  ALKPHOS 78  BILITOT 0.9  PROT 7.9  ALBUMIN 4.3   No results for input(s): "LIPASE", "AMYLASE" in the last 168 hours. No results for input(s): "AMMONIA" in the last 168 hours. Coagulation Profile: Recent Labs  Lab 10/19/22 1300  INR 1.1   Cardiac Enzymes: No results for input(s): "CKTOTAL", "CKMB", "CKMBINDEX", "TROPONINI" in the last 168 hours. BNP (last 3 results) No results for input(s): "PROBNP" in the last 8760 hours. HbA1C: Recent Labs    10/19/22 1300  HGBA1C 6.0*   CBG: Recent Labs  Lab 10/19/22 1253  GLUCAP 98   Lipid Profile: Recent Labs    10/20/22 0103  CHOL 135  HDL 54  LDLCALC 59  TRIG 108  CHOLHDL 2.5   Thyroid Function Tests: No results for input(s): "TSH", "T4TOTAL", "FREET4", "T3FREE", "THYROIDAB" in the last 72 hours. Anemia Panel: No results for input(s): "VITAMINB12", "FOLATE", "FERRITIN",  "TIBC", "IRON", "RETICCTPCT" in the last 72 hours. Sepsis Labs: No results for input(s): "PROCALCITON", "LATICACIDVEN" in the last 168 hours.  No results found for this or any previous visit (from the past 240 hour(s)).   Radiology Studies: MR BRAIN WO CONTRAST  Result Date: 10/19/2022 CLINICAL DATA:  Transient ischemic attack. Neuro deficit, acute, stroke suspected. EXAM: MRI HEAD WITHOUT CONTRAST TECHNIQUE: Multiplanar, multiecho pulse sequences of the brain and surrounding structures were obtained without intravenous contrast. COMPARISON:  CTA head/neck 10/19/2022. FINDINGS: Brain: No acute infarct or hemorrhage. Moderate chronic small-vessel disease. No hydrocephalus or extra-axial collection. No foci of abnormal susceptibility. No mass or midline shift. Vascular: Normal flow voids. Skull and upper cervical spine: Normal marrow signal. Sinuses/Orbits: Unremarkable. Other: None. IMPRESSION: 1. No acute intracranial process. 2. Moderate chronic small-vessel disease. Electronically Signed   By: Orvan Falconer M.D.   On: 10/19/2022 21:22   CT Angio Chest PE W and/or Wo Contrast  Result Date: 10/19/2022 CLINICAL DATA:  History of prostate cancer. EXAM: CT ANGIOGRAPHY CHEST WITH CONTRAST TECHNIQUE: Multidetector CT imaging of the chest was performed using the standard protocol during bolus administration of intravenous contrast. Multiplanar CT image reconstructions and MIPs were obtained to evaluate the vascular anatomy. RADIATION DOSE REDUCTION: This exam was performed according to the departmental dose-optimization program which includes automated exposure control, adjustment of the mA and/or kV according to patient size and/or use of iterative reconstruction technique. CONTRAST:  75mL OMNIPAQUE IOHEXOL 350 MG/ML SOLN COMPARISON:  None Available. FINDINGS: Cardiovascular: Significant breathing motion seen throughout the examination. This limits evaluation for pulmonary emboli. Nondiagnostic for small  and peripheral emboli. No large or central embolus. Normal caliber thoracic aorta with mild calcified plaque. Coronary artery calcifications are seen. The heart is nonenlarged. No pericardial effusion. Mediastinum/Nodes: Slightly patulous thoracic esophagus. No specific abnormal lymph node enlargement seen in the axillary region, hilum or mediastinum. There are some small lymph nodes identified in the right lung hilum which are less than a cm in short axis and not pathologic by size criteria. Lungs/Pleura: Linear opacity lung bases likely scar or atelectasis. No consolidation, pneumothorax or effusion. Upper Abdomen: Along the upper abdomen the adrenal glands are preserved. There is some high density noted within the left kidney. Please correlate with prior CT angiogram of the head from same day Musculoskeletal: Diffuse degenerative changes of the spine. There is multilevel Schmorl's node changes with bridging osteophytes and  syndesmophytes. Mild compression noted along the lower thoracic spine vertebral levels. Review of the MIP images confirms the above findings. IMPRESSION: Extensive breathing motion.  No large or central embolus. Aortic Atherosclerosis (ICD10-I70.0). Electronically Signed   By: Karen Kays M.D.   On: 10/19/2022 17:25   CT HEAD CODE STROKE WO CONTRAST  Result Date: 10/19/2022 CLINICAL DATA:  Code stroke. New onset dizziness and intermittent aphasia, resolved. EXAM: CT ANGIOGRAPHY HEAD AND NECK TECHNIQUE: Multidetector CT imaging of the head and neck was performed using the standard protocol during bolus administration of intravenous contrast. Multiplanar CT image reconstructions and MIPs were obtained to evaluate the vascular anatomy. Carotid stenosis measurements (when applicable) are obtained utilizing NASCET criteria, using the distal internal carotid diameter as the denominator. RADIATION DOSE REDUCTION: This exam was performed according to the departmental dose-optimization program  which includes automated exposure control, adjustment of the mA and/or kV according to patient size and/or use of iterative reconstruction technique. CONTRAST:  75mL OMNIPAQUE IOHEXOL 350 MG/ML SOLN COMPARISON:  None Available. FINDINGS: CT HEAD FINDINGS Brain: There is no evidence of acute intracranial hemorrhage or extra-axial fluid collection. There is no evolved large vessel territorial infarct. There is hypodensity in the left corona radiata best seen in the coronal sequence which could reflect an acute infarct in the left MCA distribution (4-32). ASPECTS is 9 Background parenchymal volume is normal. The ventricles are normal in size. Additional hypodensity in the supratentorial white matter is nonspecific but likely reflects sequela of underlying chronic small-vessel ischemic change. The pituitary and suprasellar region are normal. There is no mass lesion. There is no mass effect or midline shift. Vascular: There is calcification of the bilateral carotid siphons. Skull: Normal. Negative for fracture or focal lesion. Sinuses/Orbits: The paranasal sinuses are clear. The globes and orbits are unremarkable. Other: None. Review of the MIP images confirms the above findings CTA NECK FINDINGS Aortic arch: The imaged aortic arch is normal. The origins of the major branch vessels are patent. The subclavian arteries are patent to the level imaged. Right carotid system: The right common carotid artery is patent. There is mixed plaque of the bifurcation resulting in approximately 30-40% stenosis. The distal internal carotid artery is patent. The external carotid artery is patent. There is no evidence of dissection or aneurysm. Left carotid system: The left common carotid artery is patent. There is mild atherosclerotic irregularity at the bifurcation without significant stenosis or occlusion. The distal internal carotid artery is patent. The external carotid artery is patent. There is no evidence of dissection or aneurysm.  Vertebral arteries: Plaque of the right vertebral artery origin resulting in moderate stenosis. The vertebral arteries are otherwise patent, without hemodynamically significant stenosis or occlusion. There is no evidence of dissection or aneurysm. Skeleton: There is no acute osseous abnormality or suspicious osseous lesion. Other neck: The soft tissues of the neck are unremarkable. Upper chest: The imaged lung apices are clear. Review of the MIP images confirms the above findings CTA HEAD FINDINGS Anterior circulation: There is calcified plaque in the intracranial ICAs without greater than mild stenosis. The bilateral MCAs are patent, without proximal stenosis or occlusion. The bilateral ACAs are patent, without proximal stenosis or occlusion. The anterior communicating artery is normal. There is no aneurysm or AVM. Posterior circulation: The bilateral V4 segments are patent. The basilar artery is patent. The major cerebellar arteries appear patent. The bilateral PCAs are patent, without proximal stenosis or occlusion. A small left posterior communicating artery is identified. There is no aneurysm  or AVM. Venous sinuses: As permitted by contrast timing, patent. Anatomic variants: None. Review of the MIP images confirms the above findings IMPRESSION: 1. Possible acute to subacute infarct in the left corona radiata. Consider MRI for further evaluation. 2. No emergent vascular finding. 3. 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 Mild atherosclerotic irregularity in the left without significant stenosis. 4. 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 5. Patent intracranial vasculature. Findings communicated to Dr. Wilford Corner at 1:15 pm. Electronically Signed   By: Lesia Hausen M.D.   On: 10/19/2022 13:21   CT ANGIO HEAD NECK W WO CM (CODE STROKE)  Result Date: 10/19/2022 CLINICAL DATA:  Code stroke. New onset dizziness and intermittent aphasia, resolved.  EXAM: CT ANGIOGRAPHY HEAD AND NECK TECHNIQUE: Multidetector CT imaging of the head and neck was performed using the standard protocol during bolus administration of intravenous contrast. Multiplanar CT image reconstructions and MIPs were obtained to evaluate the vascular anatomy. Carotid stenosis measurements (when applicable) are obtained utilizing NASCET criteria, using the distal internal carotid diameter as the denominator. RADIATION DOSE REDUCTION: This exam was performed according to the departmental dose-optimization program which includes automated exposure control, adjustment of the mA and/or kV according to patient size and/or use of iterative reconstruction technique. CONTRAST:  75mL OMNIPAQUE IOHEXOL 350 MG/ML SOLN COMPARISON:  None Available. FINDINGS: CT HEAD FINDINGS Brain: There is no evidence of acute intracranial hemorrhage or extra-axial fluid collection. There is no evolved large vessel territorial infarct. There is hypodensity in the left corona radiata best seen in the coronal sequence which could reflect an acute infarct in the left MCA distribution (4-32). ASPECTS is 9 Background parenchymal volume is normal. The ventricles are normal in size. Additional hypodensity in the supratentorial white matter is nonspecific but likely reflects sequela of underlying chronic small-vessel ischemic change. The pituitary and suprasellar region are normal. There is no mass lesion. There is no mass effect or midline shift. Vascular: There is calcification of the bilateral carotid siphons. Skull: Normal. Negative for fracture or focal lesion. Sinuses/Orbits: The paranasal sinuses are clear. The globes and orbits are unremarkable. Other: None. Review of the MIP images confirms the above findings CTA NECK FINDINGS Aortic arch: The imaged aortic arch is normal. The origins of the major branch vessels are patent. The subclavian arteries are patent to the level imaged. Right carotid system: The right common  carotid artery is patent. There is mixed plaque of the bifurcation resulting in approximately 30-40% stenosis. The distal internal carotid artery is patent. The external carotid artery is patent. There is no evidence of dissection or aneurysm. Left carotid system: The left common carotid artery is patent. There is mild atherosclerotic irregularity at the bifurcation without significant stenosis or occlusion. The distal internal carotid artery is patent. The external carotid artery is patent. There is no evidence of dissection or aneurysm. Vertebral arteries: Plaque of the right vertebral artery origin resulting in moderate stenosis. The vertebral arteries are otherwise patent, without hemodynamically significant stenosis or occlusion. There is no evidence of dissection or aneurysm. Skeleton: There is no acute osseous abnormality or suspicious osseous lesion. Other neck: The soft tissues of the neck are unremarkable. Upper chest: The imaged lung apices are clear. Review of the MIP images confirms the above findings CTA HEAD FINDINGS Anterior circulation: There is calcified plaque in the intracranial ICAs without greater than mild stenosis. The bilateral MCAs are patent, without proximal stenosis or occlusion. The bilateral ACAs are patent, without  proximal stenosis or occlusion. The anterior communicating artery is normal. There is no aneurysm or AVM. Posterior circulation: The bilateral V4 segments are patent. The basilar artery is patent. The major cerebellar arteries appear patent. The bilateral PCAs are patent, without proximal stenosis or occlusion. A small left posterior communicating artery is identified. There is no aneurysm or AVM. Venous sinuses: As permitted by contrast timing, patent. Anatomic variants: None. Review of the MIP images confirms the above findings IMPRESSION: 1. Possible acute to subacute infarct in the left corona radiata. Consider MRI for further evaluation. 2. No emergent vascular  finding. 3. 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 Mild atherosclerotic irregularity in the left without significant stenosis. 4. 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 5. Patent intracranial vasculature. Findings communicated to Dr. Wilford Corner at 1:15 pm. Electronically Signed   By: Lesia Hausen M.D.   On: 10/19/2022 13:21    Scheduled Meds:   stroke: early stages of recovery book   Does not apply Once   docusate sodium  100 mg Oral BID   rosuvastatin  20 mg Oral QPM   sodium chloride flush  3 mL Intravenous Once   Continuous Infusions:  sodium chloride 75 mL/hr at 10/20/22 0800   diltiazem (CARDIZEM) infusion 5 mg/hr (10/20/22 0800)   heparin       LOS: 1 day   Hughie Closs, MD [facility]  10/20/2022, 11:33 AM   *Please note that this is a verbal dictation therefore any spelling or grammatical errors are due to the "Dragon [employer] One" system interpretation.  Please page via Amion and do not message via secure chat for urgent patient care matters. Secure chat can be used for non urgent patient care matters.  How to contact the [facility] Attending or Consulting provider 7A - 7P or covering provider during after hours 7P -7A, for this patient?  Check the care team in [facility] and look for a) attending/consulting [facility] provider listed and b) the [facility] team listed. Page or secure chat 7A-7P. Log into [email or web address] and use [place]'s universal password to access. If you do not have the password, please contact the hospital operator. Locate the [facility] provider you are looking for under [facility] and page to a number that you can be directly reached. If you still have difficulty reaching the provider, please page the [facility] (Director on Call) for the Hospitalists listed on amion for assistance.

## 2022-10-20 NOTE — Plan of Care (Signed)
  Problem: Education: Goal: Knowledge of disease or condition will improve Outcome: Progressing Goal: Knowledge of secondary prevention will improve (MUST DOCUMENT ALL) Outcome: Progressing Goal: Knowledge of patient specific risk factors will improve (Mark N/A or DELETE if not current risk factor) Outcome: Progressing   Problem: Ischemic Stroke/TIA Tissue Perfusion: Goal: Complications of ischemic stroke/TIA will be minimized Outcome: Progressing   Problem: Clinical Measurements: Goal: Cardiovascular complication will be avoided Outcome: Progressing   

## 2022-10-20 NOTE — Progress Notes (Signed)
ANTICOAGULATION CONSULT NOTE  Pharmacy Consult for Heparin Indication:  Afib  Allergies  Allergen Reactions   Sulfa Antibiotics Hives    Patient Measurements: Height: 5\' 7"  (170.2 cm) Weight: 83 kg (182 lb 15.7 oz) IBW/kg (Calculated) : 66.1 Heparin Dosing Weight: 82.7 kg  Vital Signs: Temp: 97.5 F (36.4 C) (04/30 1943) Temp Source: Oral (04/30 1943) BP: 159/70 (04/30 1943) Pulse Rate: 69 (04/30 1943)  Labs: Recent Labs    10/19/22 1300 10/20/22 0103 10/20/22 0935 10/20/22 1029 10/20/22 1730  HGB 16.3  17.7*  --   --   --   --   HCT 50.3  52.0  --   --   --   --   PLT 268  --   --   --   --   APTT 31  --   --   --   --   LABPROT 14.2  --   --   --   --   INR 1.1  --   --   --   --   HEPARINUNFRC  --  <0.10*  --  0.21* 0.52  CREATININE 1.41*  1.40*  --   --   --   --   TROPONINIHS  --   --  140* 126*  --      Estimated Creatinine Clearance: 51 mL/min (A) (by C-G formula based on SCr of 1.41 mg/dL (H)).   Assessment: 62 yom with a history of left carotid stenosis status post endarterectomy, HTN, HLD, prostate cancer. Patient is presenting as a code stroke; however, MRI negative.  OK to target full heparin level goal and bolus IV heparin as needed for subtherapeutic levels per discussion with [facility] and Neuro.  Heparin level therapeutic; no issue reported.  Goal of Therapy:  Heparin level 0.3-0.7 units/ml Monitor platelets by anticoagulation protocol: Yes   Plan:  Continue heparin gtt at 1350 units/hr F/u AM labs and ability to transition to Eliquis after GI w/u  Prudence Heiny D. Laney Potash, PharmD, BCPS, BCCCP 10/20/2022, 8:06 PM

## 2022-10-20 NOTE — Progress Notes (Signed)
Noted pt HR on the monitor ranges from 54-59 bpm. Currently on Cardizem drip at 5mg /hr. Latest BP 110/68. Pt denies any chest pain or discomfort. Confirmed to CCMD pt HR converted to sinus bradycardia around 5 am. On call cardio made aware, verbalized ok to continue the drip since pt is hemodynamically stable. Will continue to monitor.

## 2022-10-21 ENCOUNTER — Inpatient Hospital Stay (HOSPITAL_COMMUNITY): Payer: Medicare Other | Admitting: Anesthesiology

## 2022-10-21 ENCOUNTER — Other Ambulatory Visit (HOSPITAL_COMMUNITY): Payer: Self-pay

## 2022-10-21 ENCOUNTER — Encounter (HOSPITAL_COMMUNITY)
Admission: EM | Disposition: A | Discharge status: Home / Self Care | Payer: Self-pay | Source: Home / Self Care | Attending: Family Medicine

## 2022-10-21 DIAGNOSIS — I739 Peripheral vascular disease, unspecified: Secondary | ICD-10-CM

## 2022-10-21 DIAGNOSIS — I63421 Cerebral infarction due to embolism of right anterior cerebral artery: Secondary | ICD-10-CM | POA: Diagnosis not present

## 2022-10-21 DIAGNOSIS — I10 Essential (primary) hypertension: Secondary | ICD-10-CM

## 2022-10-21 DIAGNOSIS — R079 Chest pain, unspecified: Secondary | ICD-10-CM | POA: Diagnosis not present

## 2022-10-21 DIAGNOSIS — G459 Transient cerebral ischemic attack, unspecified: Secondary | ICD-10-CM | POA: Diagnosis not present

## 2022-10-21 DIAGNOSIS — D649 Anemia, unspecified: Secondary | ICD-10-CM

## 2022-10-21 DIAGNOSIS — K6289 Other specified diseases of anus and rectum: Secondary | ICD-10-CM

## 2022-10-21 DIAGNOSIS — I4891 Unspecified atrial fibrillation: Secondary | ICD-10-CM | POA: Diagnosis not present

## 2022-10-21 HISTORY — PX: FLEXIBLE SIGMOIDOSCOPY: SHX5431

## 2022-10-21 HISTORY — PX: BIOPSY: SHX5522

## 2022-10-21 LAB — BASIC METABOLIC PANEL
Anion gap: 7 (ref 5–15)
BUN: 15 mg/dL (ref 8–23)
CO2: 24 mmol/L (ref 22–32)
Calcium: 9 mg/dL (ref 8.9–10.3)
Chloride: 105 mmol/L (ref 98–111)
Creatinine, Ser: 1.21 mg/dL (ref 0.61–1.24)
GFR, Estimated: >60 mL/min (ref >60)
Glucose, Bld: 109 mg/dL — ABNORMAL HIGH (ref 70–99)
Potassium: 4 mmol/L (ref 3.5–5.1)
Sodium: 136 mmol/L (ref 135–145)

## 2022-10-21 LAB — CBC WITH DIFFERENTIAL/PLATELET
Abs Immature Granulocytes: 0.01 10*3/uL (ref 0.00–0.07)
Basophils Absolute: 0.1 10*3/uL (ref 0.0–0.1)
Basophils Relative: 1 %
Eosinophils Absolute: 0.3 10*3/uL (ref 0.0–0.5)
Eosinophils Relative: 4 %
HCT: 49.7 % (ref 39.0–52.0)
Hemoglobin: 15.8 g/dL (ref 13.0–17.0)
Immature Granulocytes: 0 %
Lymphocytes Relative: 32 %
Lymphs Abs: 2.3 10*3/uL (ref 0.7–4.0)
MCH: 27.9 pg (ref 26.0–34.0)
MCHC: 31.8 g/dL (ref 30.0–36.0)
MCV: 87.7 fL (ref 80.0–100.0)
Monocytes Absolute: 0.6 10*3/uL (ref 0.1–1.0)
Monocytes Relative: 8 %
Neutro Abs: 3.9 10*3/uL (ref 1.7–7.7)
Neutrophils Relative %: 55 %
Platelets: 219 10*3/uL (ref 150–400)
RBC: 5.67 MIL/uL (ref 4.22–5.81)
RDW: 14.8 % (ref 11.5–15.5)
WBC: 7.2 10*3/uL (ref 4.0–10.5)
nRBC: 0 % (ref 0.0–0.2)

## 2022-10-21 LAB — HEPARIN LEVEL (UNFRACTIONATED): Heparin Unfractionated: 0.82 IU/mL — ABNORMAL HIGH (ref 0.30–0.70)

## 2022-10-21 SURGERY — SIGMOIDOSCOPY, FLEXIBLE
Anesthesia: Monitor Anesthesia Care

## 2022-10-21 MED ORDER — LACTATED RINGERS IV SOLN: INTRAVENOUS | Status: DC

## 2022-10-21 MED ORDER — SODIUM CHLORIDE 0.9 % IV SOLN: INTRAVENOUS | Status: DC

## 2022-10-21 MED ORDER — APIXABAN 5 MG PO TABS
5.0000 mg | ORAL_TABLET | Freq: Two times a day (BID) | ORAL | 0 refills | Status: DC
  Filled 2022-10-21: qty 60, 30d supply, fill #0

## 2022-10-21 MED ORDER — METOPROLOL SUCCINATE ER 25 MG PO TB24
25.0000 mg | ORAL_TABLET | Freq: Every day | ORAL | 0 refills | Status: DC
  Filled 2022-10-21: qty 30, 30d supply, fill #0

## 2022-10-21 MED ORDER — PROPOFOL 500 MG/50ML IV EMUL
INTRAVENOUS | Status: DC | PRN
  Administered 2022-10-21: 150 ug/kg/min via INTRAVENOUS

## 2022-10-21 MED ORDER — METOPROLOL SUCCINATE ER 25 MG PO TB24
25.0000 mg | ORAL_TABLET | Freq: Every day | ORAL | Status: DC
  Administered 2022-10-21: 25 mg via ORAL
  Filled 2022-10-21: qty 1

## 2022-10-21 NOTE — Transfer of Care (Signed)
Immediate Anesthesia Transfer of Care Note  Patient: Peter Roberts  Procedure(s) Performed: FLEXIBLE SIGMOIDOSCOPY BIOPSY  Patient Location: PACU  Anesthesia Type:MAC  Level of Consciousness: drowsy  Airway & Oxygen Therapy: Patient Spontanous Breathing and Patient connected to face mask oxygen  Post-op Assessment: Report given to RN and Post -op Vital signs reviewed and stable  Post vital signs: Reviewed and stable  Last Vitals:  Vitals Value Taken Time  BP    Temp    Pulse    Resp    SpO2      Last Pain:  Vitals:   10/21/22 1557  TempSrc: Temporal  PainSc: 0-No pain      Patients Stated Pain Goal: 0 (10/20/22 1800)  Complications: No notable events documented.

## 2022-10-21 NOTE — Progress Notes (Signed)
TOC medications given to pt in anticipation for d/c home.  AVS reviewed.  Eliquis coupon given to pt.  Pt aware of BP re-check needed prior to discharge.

## 2022-10-21 NOTE — Op Note (Signed)
[facility] Patient Name: Jia Mohamed Procedure Date : 10/21/2022 MRN: [id number] Attending MD: Liliane Shi DO, DO, [id number] Date of Birth: [birth year] CSN: [id number] Age: 72 Admit Type: Inpatient Procedure:                Flexible Sigmoidoscopy Indications:              Hematochezia, Abnormal CT of the GI tract Providers:                Liliane Shi DO, DO, Fransisca Connors, Faustina                            Mbumina, Technician Referring MD:              Medicines:                See the Anesthesia note for documentation of the                            administered medications Complications:            No immediate complications. Estimated Blood Loss:     Estimated blood loss was minimal. Procedure:                Pre-Anesthesia Assessment:                           - ASA Grade Assessment: III - A patient with severe                            systemic disease.                           - The risks and benefits of the procedure and the                            sedation options and risks were discussed with the                            patient. All questions were answered and informed                            consent was obtained.                           After obtaining informed consent, the scope was                            passed under direct vision. The CF-[id number] ([id number])                            Olympus colonoscope was introduced through the anus                            and advanced to the 40 cm from the anal verge. The  flexible sigmoidoscopy was accomplished without                            difficulty. The patient tolerated the procedure                            well. The quality of the bowel preparation was good. Scope In: 4:49:28 PM Scope Out: 4:55:26 PM Total Procedure Duration: 0 hours 5 minutes 58 seconds  Findings:      The perianal and digital rectal examinations were normal.      A diffuse area  of mildly erythematous mucosa was found in the rectum.       Biopsies were taken with a cold forceps for histology. Impression:               - Erythematous mucosa in the rectum. Biopsied. Recommendation:           - Await pathology results.                           - Return patient to hospital ward for ongoing care.                           - Ok to start anticoagulation from GI perspective.                           - The findings and recommendations were discussed                            with the patient's primary physician. Procedure Code(s):        --- Professional ---                           [phone], Sigmoidoscopy, flexible; with biopsy, single                            or multiple Diagnosis Code(s):        --- Professional ---                           K62.89, Other specified diseases of anus and rectum                           K92.1, Melena (includes Hematochezia)                           R93.3, Abnormal findings on diagnostic imaging of                            other parts of digestive tract CPT copyright 2022 [employer]. All rights reserved. The codes documented in this report are preliminary and upon coder review may  be revised to meet current compliance requirements. Dr Liliane Shi, DO Liliane Shi DO, DO 10/21/2022 5:03:42 PM Number of Addenda: 0

## 2022-10-21 NOTE — Progress Notes (Signed)
Pt arrived back from endo - BP 180-190/70s.  MD notified.  Discharge order pending.

## 2022-10-21 NOTE — Anesthesia Preprocedure Evaluation (Addendum)
Anesthesia Evaluation  Patient identified by MRN, date of birth, ID band Patient awake    Reviewed: Allergy & Precautions, NPO status , Patient's Chart, lab work & pertinent test results, reviewed documented beta blocker date and time   Airway Mallampati: II  TM Distance: >3 FB Neck ROM: Full    Dental  (+) Teeth Intact, Dental Advisory Given   Pulmonary neg pulmonary ROS   Pulmonary exam normal breath sounds clear to auscultation       Cardiovascular hypertension, Pt. on medications and Pt. on home beta blockers + Peripheral Vascular Disease  Normal cardiovascular exam+ dysrhythmias Atrial Fibrillation  Rhythm:Regular Rate:Normal     Neuro/Psych  Neuromuscular disease CVA    GI/Hepatic Neg liver ROS,,,Abnormal CT imaging, blood per rectum   Endo/Other  negative endocrine ROS    Renal/GU Renal InsufficiencyRenal disease     Musculoskeletal  (+) Arthritis ,    Abdominal   Peds  Hematology  (+) Blood dyscrasia (Eliquis), anemia   Anesthesia Other Findings Day of surgery medications reviewed with the patient.  Reproductive/Obstetrics                             Anesthesia Physical Anesthesia Plan  ASA: 3  Anesthesia Plan: MAC   Post-op Pain Management:    Induction: Intravenous  PONV Risk Score and Plan: 1 and TIVA  Airway Management Planned: Natural Airway and Simple Face Mask  Additional Equipment:   Intra-op Plan:   Post-operative Plan:   Informed Consent: I have reviewed the patients History and Physical, chart, labs and discussed the procedure including the risks, benefits and alternatives for the proposed anesthesia with the patient or authorized representative who has indicated his/her understanding and acceptance.     Dental advisory given  Plan Discussed with: CRNA and Anesthesiologist  Anesthesia Plan Comments:        Anesthesia Quick Evaluation

## 2022-10-21 NOTE — Progress Notes (Addendum)
ANTICOAGULATION CONSULT NOTE  Pharmacy Consult for Heparin Indication:  Afib  Allergies  Allergen Reactions   Sulfa Antibiotics Hives    Patient Measurements: Height: 5\' 7"  (170.2 cm) Weight: 83 kg (182 lb 15.7 oz) IBW/kg (Calculated) : 66.1 Heparin Dosing Weight: 82.7 kg  Vital Signs: Temp: 97.9 F (36.6 C) (05/01 0744) Temp Source: Oral (05/01 0744) BP: 151/73 (05/01 0744) Pulse Rate: 67 (05/01 0744)  Labs: Recent Labs    10/19/22 1300 10/20/22 0103 10/20/22 0935 10/20/22 1029 10/20/22 1730 10/21/22 0650  HGB 16.3  17.7*  --   --   --   --  15.8  HCT 50.3  52.0  --   --   --   --  49.7  PLT 268  --   --   --   --  219  APTT 31  --   --   --   --   --   LABPROT 14.2  --   --   --   --   --   INR 1.1  --   --   --   --   --   HEPARINUNFRC  --    < >  --  0.21* 0.52 0.82*  CREATININE 1.41*  1.40*  --   --   --   --   --   TROPONINIHS  --   --  140* 126*  --   --    < > = values in this interval not displayed.     Estimated Creatinine Clearance: 51 mL/min (A) (by C-G formula based on SCr of 1.41 mg/dL (H)).   Assessment: 12 yom with a history of left carotid stenosis status post endarterectomy, HTN, HLD, prostate cancer. Patient is presenting as a code stroke; however, MRI negative.  OK to target full heparin level goal and bolus IV heparin as needed for subtherapeutic levels per discussion with TRH and Neuro.  Heparin level slightly supratherapeutic at 0.82 with heparin at 1350 units/hr. Patient previously subtherapeutic at heparin 1250 units/hr. Will reduce rate slightly. Planning for flex sig this afternoon ~1545. Per GI, hold heparin drip 6 hours prior to procedure.  Goal of Therapy:  Heparin level 0.3-0.7 units/ml Monitor platelets by anticoagulation protocol: Yes   Plan:  Reduce heparin drip slightly to 1300 units/hr Heparin drip to stop 6 hours prior to flex sig (stop ~0900) F/u AM labs and ability to transition to Eliquis after GI  w/u  Rexford Maus, PharmD, BCPS 10/21/2022 8:05 AM

## 2022-10-21 NOTE — Discharge Instructions (Signed)

## 2022-10-21 NOTE — Progress Notes (Signed)
   10/21/22 1826  Vitals  BP (!) 160/89  MAP (mmHg) 106  BP Location Left Arm  BP Method Automatic  Patient Position (if appropriate) Sitting  Pulse Rate 65  Pulse Rate Source Monitor  Oxygen Therapy  SpO2 100 %  MEWS Score  MEWS Temp 0  MEWS Systolic 0  MEWS Pulse 0  MEWS RR 0  MEWS LOC 0  MEWS Score 0  MEWS Score Color Chilton Si

## 2022-10-21 NOTE — Discharge Summary (Signed)
Physician Discharge Summary  Peter Roberts ZOX:[id number] DOB: 09/14/1950 DOA: 10/19/2022  PCP: Elias Else, MD (Inactive)  Admit date: 10/19/2022 Discharge date: 10/21/2022 30 Day Unplanned Readmission Risk Score    Flowsheet Row ED to Hosp-Admission (Current) from 10/19/2022 in [facility]  30 Day Unplanned Readmission Risk Score (%) 13.95 Filed at 10/21/2022 1200       This score is the patient's risk of an unplanned readmission within 30 days of being discharged (0 -100%). The score is based on dignosis, age, lab data, medications, orders, and past utilization.   Low:  0-14.9   Medium: 15-21.9   High: 22-29.9   Extreme: 30 and above          Admitted From: Home Disposition: Home  Recommendations for Outpatient Follow-up:  Follow up with PCP in 1-2 weeks Please obtain BMP/CBC in one week Follow-up with cardiology as they have scheduled-they have recommended and will arrange outpatient sleep study. Please follow up with your PCP on the following pending results: Unresulted Labs (From admission, onward)     Start     Ordered   10/21/22 0500  Heparin level (unfractionated)  Daily,   R     Question:  Specimen collection method  Answer:  Lab=Lab collect   10/20/22 1001              Home Health: None Equipment/Devices: None  Discharge Condition: Stable CODE STATUS: Full code Diet recommendation: Cardiac  Subjective: Seen and examined.  He has no complaints.  He desires to go home.  Brief/Interim Summary: Peter Roberts is a 72 y.o. male with medical history significant of hypertension, hyperlipidemia recent diagnosis of prostate cancer in November and multiple other comorbidities was brought into the emergency department with several complaints.  History was mostly obtained by the daughter who is at bedside.  Reportedly, patient was scheduled to have sigmoidoscopy on the day of presentation so he was n.p.o. since last night, on the morning of present  patient to the ED, while he was walking outside the house, he developed chest pain, he went home and felt dizzy.  He called his daughter and his son-in-law went to check on him.  This was around 11:45 AM.  EMS was called and when EMS arrived, patient started having some difficulty speaking.  Per daughter, this lasted only about half an hour and by the time patient arrived to the ED, his speech was back to normal.  Patient never had any deficit or weakness to any part of the body.  Per daughter, EMS also noted some facial droop but that also disappeared upon arrival to the ED.  No other complaint.  No recent history of travel, and history of sick contact and no previous history of thromboembolism.  Per daughter, he was going to get sigmoidoscopy due to him having some rectal bleeding in March and he was going to start treatment, either radiation or chemo for his prostate cancer.    Upon arrival to ED, patient was in atrial fibrillation with slightly elevated blood pressure with mild tachycardia.due to stroke features, code stroke was called, patient underwent CT head as well as CT angio head and neck and was found to have "Possible acute to subacute infarct in the left corona radiata".  Seen by neurology, admitted to hospital service, eventually underwent MRI which was surprisingly totally negative for stroke.  Stroke was ruled out.  He was cleared from neurology for discharge.  Due to atrial fibrillation, he was started  on Cardizem drip as well as heparin drip.  Due to having high CHA2DS2-VASc score and high risk of thromboembolism, cardiology did not want him to be off of anticoagulation in preparation for sigmoidoscopy down the road so they recommended consulting GI.  Eventually patient underwent sigmoidoscopy.  Details below about that.  Patient converted to sinus rhythm on the morning of 10/20/2022 and was transitioned off of Cardizem drip to oral Toprol-XL by cardiology on 10/21/2022.  Cardiology recommended  discharging on Eliquis 5 mg p.o. twice daily.   Recent history of GI bleed: S/p sigmoidoscopy.   Chest pressure/pain/demand ischemia: No more chest pain.  CTA ruled out PE.  Troponins elevated but flat.  Per cardiology, this is demand ischemia.  Echo is pending.     History of prostate cancer: Follow-up with urology and oncology as outpatient.  Discharge plan was discussed with patient and/or family member and they verbalized understanding and agreed with it.  Discharge Diagnoses:  Principal Problem:   Cerebrovascular accident (CVA) [facility]) Active Problems:   Asymptomatic carotid artery stenosis without infarction, left   Chronic kidney disease, stage 3a (HCC)   Primary hypertension   Atrial fibrillation with RVR (HCC)   Chest pain of uncertain etiology    Discharge Instructions   Allergies as of 10/21/2022       Reactions   Sulfa Antibiotics Hives        Medication List     STOP taking these medications    amLODipine 5 MG tablet Commonly known as: NORVASC       TAKE these medications    apixaban 5 MG Tabs tablet Commonly known as: Eliquis Take 1 tablet (5 mg total) by mouth 2 (two) times daily.   Iron 90 (18 Fe) MG Tabs Take 90 mg by mouth every evening.   Magnesium 500 MG Caps Take 1 capsule by mouth every evening.   metoprolol succinate 25 MG 24 hr tablet Commonly known as: TOPROL-XL Take 1 tablet (25 mg total) by mouth daily.   Oil of Oregano 1500 MG Caps Take 1 capsule by mouth daily.   OVER THE COUNTER MEDICATION Take 3 capsules by mouth daily after lunch. Prostagenix   OVER THE COUNTER MEDICATION Take 1 tablet by mouth daily. Circ O2   Probiotic Daily Caps Take 1 capsule by mouth daily.   rosuvastatin 20 MG tablet Commonly known as: CRESTOR Take 1 tablet (20 mg total) by mouth every evening.   STOOL SOFTENER PO Take 1 capsule by mouth daily as needed (constipation).   Vitamin D3 125 MCG (5000 UT) Caps Take 5,000 Units by mouth daily.    Voltaren 1 % Gel Generic drug: diclofenac Sodium Apply 4 g topically 4 (four) times daily as needed (pain).        Follow-up Information     Elias Else, MD Follow up in 1 week(s).   Specialty: Family Medicine Contact information: [phone] [address] [address] [phone]                Allergies  Allergen Reactions   Sulfa Antibiotics Hives    Consultations: ***   Procedures/Studies: CT CORONARY MORPH W/CTA COR W/SCORE W/CA W/CM &/OR WO/CM  Addendum Date: 10/20/2022   ADDENDUM REPORT: 10/20/2022 14:58 EXAM: OVER-READ INTERPRETATION  CT CHEST The following report is an over-read performed by radiologist Dr. Duanne Guess of [facility] on 10/20/2022. This over-read does not include interpretation of cardiac or coronary anatomy or pathology. The coronary CTA interpretation  by the cardiologist is attached. COMPARISON:  10/19/2022 FINDINGS: Normal heart size. No pericardial effusion. Image thoracic aorta is nonaneurysmal. Central pulmonary vasculature within normal limits. No adenopathy within the included chest. Imaged lung fields are clear. No acute bony or chest wall abnormality. 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 IMPRESSION: No acute or significant extracardiac findings. Electronically Signed   By: Duanne Guess D.O.   On: 10/20/2022 14:58   Result Date: 10/20/2022 CLINICAL DATA:  49M with chest pain EXAM: Cardiac/Coronary CTA TECHNIQUE: The patient was scanned on a [employer]. FINDINGS: A 100 kV prospective scan was triggered in the descending thoracic aorta at 111 HU's. Axial non-contrast 3 mm slices were carried out through the heart. The data set was analyzed on a dedicated work station and scored using the Agatson method. Gantry rotation speed was 250 msecs and collimation was .6 mm. No beta blockade and 0.8 mg of sl NTG was given. The 3D data set was  reconstructed in 5% intervals of the 35-75% of the R-R cycle. Phases were analyzed on a dedicated work station using MPR, MIP and VRT modes. The patient received 100 cc of contrast. Coronary Arteries:  Normal coronary origin.  Right dominance. RCA is a large dominant artery that gives rise to PDA and PLA. Mixed plaque in proximal RCA causes 0-24% stenosis. Calcified plaque in distal RCA causes 25-49% stenosis. Left main is a large artery that gives rise to LAD and LCX arteries. LAD is a large vessel. Calcified plaque in proximal LAD causes 0-24% stenosis. Calcified plaque in mid LAD causes 25-49% stenosis. Calcified plaque in ostial D1 causes 50-69% stenosis. Mixed plaque in D2 causes 25-49% stenosis LCX is a non-dominant artery that gives rise to one large OM1 branch. Calcified plaque in proximal LCX causes 25-49% stenosis. Calcified plaque in OM1 causes 50-69% stenosis Other findings: Left Ventricle: Normal size Left Atrium: Mild enlargement Pulmonary Veins: Normal configuration Right Ventricle: Mild dilatation Right Atrium: Mild enlargement Cardiac valves: No calcifications Thoracic aorta: Normal size Pulmonary Arteries: Normal size Systemic Veins: Normal drainage Pericardium: Normal thickness IMPRESSION: 1. Coronary calcium score of 585. This was 78th percentile for age and sex matched control. 2. Total plaque volume 441mm3 which is 40th percentile for age and sex-matched controls (calcified plaque 18mm3; noncalcified plaque 373mm3). TPV is severe 2. Normal coronary origin with right dominance. 3. Calcified plaque in ostial D1 causes moderate (50-69%) stenosis 4. Calcified plaque in OM1 causes moderate (50-69%) stenosis 5. Mild (25-49%) stenosis in mid LAD, D2, proximal LCX, and distal RCA 6. Will send study for CTFFR CAD-RADS 3. Moderate stenosis. Consider symptom-guided anti-ischemic pharmacotherapy as well as risk factor modification per guideline directed care. Additional analysis with CT FFR will be  submitted. Electronically Signed: By: Epifanio Lesches M.D. On: 10/20/2022 14:42   CT CORONARY FFR DATA PREP & FLUID ANALYSIS  Result Date: 10/20/2022 EXAM: FFRCT ANALYSIS FINDINGS: FFRct analysis was performed on the original cardiac CT angiogram dataset. Diagrammatic representation of the FFRct analysis is provided in a separate PDF document in PACS. This dictation was created using the PDF document and an interactive 3D model of the results. 3D model is not available in the EMR/PACS. Normal FFR range is >0.80. 1. Left Main: No significant stenosis 2. LAD: CTFFR 0.83 across lesion in mid LAD, suggesting no significant stenosis 3. LCX: CTFFR 0.92 across lesion in proximal LCX suggesting no significant stenosis 4. RCA: CTFFR 0.91 across lesion in distal RCA suggesting no significant  stenosis IMPRESSION: 1.  CTFFR suggests nonobstructive CAD 2. OM1 and D1 had moderate stenosis on CTA but were not modeled on CTFFR due to small vessel size Electronically Signed   By: Epifanio Lesches M.D.   On: 10/20/2022 14:54   ECHOCARDIOGRAM COMPLETE  Result Date: 10/20/2022    ECHOCARDIOGRAM REPORT   Patient Name:   Peter Roberts Date of Exam: 10/20/2022 Medical Rec #:  [id number]    Height:       67.0 in Accession #:    [id number]   Weight:       183.0 lb Date of Birth:  1950-12-02    BSA:          1.947 m Patient Age:    71 years     BP:           131/76 mmHg Patient Gender: M            HR:           58 bpm. Exam Location:  Inpatient Procedure: 2D Echo, Cardiac Doppler, Color Doppler and Intracardiac            Opacification Agent Indications:    Stroke.  History:        Patient has no prior history of Echocardiogram examinations.                 Abnormal ECG, Stroke, Arrythmias:Atrial Fibrillation;                 Signs/Symptoms:Chest Pain.  Sonographer:    Sheralyn Boatman RDCS Referring Phys: [id number] [facility]  Sonographer Comments: Suboptimal apical window. Paused for stat lab draw IMPRESSIONS  1. Left ventricular  ejection fraction, by estimation, is 60 to 65%. The left ventricle has normal function. The left ventricle has no regional wall motion abnormalities. Left ventricular diastolic parameters were normal.  2. Right ventricular systolic function is normal. The right ventricular size is normal.  3. The mitral valve is normal in structure. Trivial mitral valve regurgitation. No evidence of mitral stenosis.  4. The aortic valve was not well visualized. Aortic valve regurgitation is not visualized. No aortic stenosis is present.  5. The inferior vena cava is normal in size with greater than 50% respiratory variability, suggesting right atrial pressure of 3 mmHg. Comparison(s): No prior Echocardiogram. FINDINGS  Left Ventricle: Left ventricular ejection fraction, by estimation, is 60 to 65%. The left ventricle has normal function. The left ventricle has no regional wall motion abnormalities. Definity contrast agent was given IV to delineate the left ventricular  endocardial borders. The left ventricular internal cavity size was normal in size. There is no left ventricular hypertrophy. Left ventricular diastolic parameters were normal. Right Ventricle: The right ventricular size is normal. No increase in right ventricular wall thickness. Right ventricular systolic function is normal. Left Atrium: Left atrial size was normal in size. Right Atrium: Right atrial size was normal in size. Pericardium: There is no evidence of pericardial effusion. Mitral Valve: The mitral valve is normal in structure. Trivial mitral valve regurgitation. No evidence of mitral valve stenosis. Tricuspid Valve: The tricuspid valve is normal in structure. Tricuspid valve regurgitation is trivial. No evidence of tricuspid stenosis. Aortic Valve: The aortic valve was not well visualized. Aortic valve regurgitation is not visualized. No aortic stenosis is present. Pulmonic Valve: The pulmonic valve was normal in structure. Pulmonic valve regurgitation is  not visualized. No evidence of pulmonic stenosis. Aorta: The aortic root and ascending aorta are structurally normal, with no  evidence of dilitation. Venous: The inferior vena cava is normal in size with greater than 50% respiratory variability, suggesting right atrial pressure of 3 mmHg. IAS/Shunts: No atrial level shunt detected by color flow Doppler.  LEFT VENTRICLE PLAX 2D LVIDd:         4.30 cm   Diastology LVIDs:         2.80 cm   LV e' medial:    8.27 cm/s LV PW:         0.90 cm   LV E/e' medial:  9.4 LV IVS:        1.00 cm   LV e' lateral:   10.40 cm/s LVOT diam:     2.40 cm   LV E/e' lateral: 7.5 LV SV:         88 LV SV Index:   45 LVOT Area:     4.52 cm  RIGHT VENTRICLE             IVC RV S prime:     14.60 cm/s  IVC diam: 1.60 cm TAPSE (M-mode): 2.2 cm LEFT ATRIUM             Index        RIGHT ATRIUM           Index LA diam:        3.90 cm 2.00 cm/m   RA Area:     15.80 cm LA Vol (A2C):   42.6 ml 21.88 ml/m  RA Volume:   34.60 ml  17.77 ml/m LA Vol (A4C):   27.0 ml 13.87 ml/m LA Biplane Vol: 33.7 ml 17.31 ml/m  AORTIC VALVE LVOT Vmax:   90.70 cm/s LVOT Vmean:  56.600 cm/s LVOT VTI:    0.195 m  AORTA Ao Root diam: 2.90 cm Ao Asc diam:  3.10 cm MITRAL VALVE MV Area (PHT): 3.42 cm    SHUNTS MV Decel Time: 222 msec    Systemic VTI:  0.20 m MV E velocity: 78.10 cm/s  Systemic Diam: 2.40 cm MV A velocity: 81.20 cm/s MV E/A ratio:  0.96 Riley Lam MD Electronically signed by Riley Lam MD Signature Date/Time: 10/20/2022/1:21:47 PM    Final    MR BRAIN WO CONTRAST  Result Date: 10/19/2022 CLINICAL DATA:  Transient ischemic attack. Neuro deficit, acute, stroke suspected. EXAM: MRI HEAD WITHOUT CONTRAST TECHNIQUE: Multiplanar, multiecho pulse sequences of the brain and surrounding structures were obtained without intravenous contrast. COMPARISON:  CTA head/neck 10/19/2022. FINDINGS: Brain: No acute infarct or hemorrhage. Moderate chronic small-vessel disease. No hydrocephalus or  extra-axial collection. No foci of abnormal susceptibility. No mass or midline shift. Vascular: Normal flow voids. Skull and upper cervical spine: Normal marrow signal. Sinuses/Orbits: Unremarkable. Other: None. IMPRESSION: 1. No acute intracranial process. 2. Moderate chronic small-vessel disease. Electronically Signed   By: Orvan Falconer M.D.   On: 10/19/2022 21:22   CT Angio Chest PE W and/or Wo Contrast  Result Date: 10/19/2022 CLINICAL DATA:  History of prostate cancer. EXAM: CT ANGIOGRAPHY CHEST WITH CONTRAST TECHNIQUE: Multidetector CT imaging of the chest was performed using the standard protocol during bolus administration of intravenous contrast. Multiplanar CT image reconstructions and MIPs were obtained to evaluate the vascular anatomy. RADIATION DOSE REDUCTION: This exam was performed according to the departmental dose-optimization program which includes automated exposure control, adjustment of the mA and/or kV according to patient size and/or use of iterative reconstruction technique. CONTRAST:  75mL OMNIPAQUE IOHEXOL 350 MG/ML SOLN COMPARISON:  None Available. FINDINGS: Cardiovascular: Significant breathing  motion seen throughout the examination. This limits evaluation for pulmonary emboli. Nondiagnostic for small and peripheral emboli. No large or central embolus. Normal caliber thoracic aorta with mild calcified plaque. Coronary artery calcifications are seen. The heart is nonenlarged. No pericardial effusion. Mediastinum/Nodes: Slightly patulous thoracic esophagus. No specific abnormal lymph node enlargement seen in the axillary region, hilum or mediastinum. There are some small lymph nodes identified in the right lung hilum which are less than a cm in short axis and not pathologic by size criteria. Lungs/Pleura: Linear opacity lung bases likely scar or atelectasis. No consolidation, pneumothorax or effusion. Upper Abdomen: Along the upper abdomen the adrenal glands are preserved. There is  some high density noted within the left kidney. Please correlate with prior CT angiogram of the head from same day Musculoskeletal: Diffuse degenerative changes of the spine. There is multilevel Schmorl's node changes with bridging osteophytes and syndesmophytes. Mild compression noted along the lower thoracic spine vertebral levels. Review of the MIP images confirms the above findings. IMPRESSION: Extensive breathing motion.  No large or central embolus. Aortic Atherosclerosis (ICD10-I70.0). Electronically Signed   By: Karen Kays M.D.   On: 10/19/2022 17:25   CT HEAD CODE STROKE WO CONTRAST  Result Date: 10/19/2022 CLINICAL DATA:  Code stroke. New onset dizziness and intermittent aphasia, resolved. EXAM: CT ANGIOGRAPHY HEAD AND NECK TECHNIQUE: Multidetector CT imaging of the head and neck was performed using the standard protocol during bolus administration of intravenous contrast. Multiplanar CT image reconstructions and MIPs were obtained to evaluate the vascular anatomy. Carotid stenosis measurements (when applicable) are obtained utilizing NASCET criteria, using the distal internal carotid diameter as the denominator. RADIATION DOSE REDUCTION: This exam was performed according to the departmental dose-optimization program which includes automated exposure control, adjustment of the mA and/or kV according to patient size and/or use of iterative reconstruction technique. CONTRAST:  75mL OMNIPAQUE IOHEXOL 350 MG/ML SOLN COMPARISON:  None Available. FINDINGS: CT HEAD FINDINGS Brain: There is no evidence of acute intracranial hemorrhage or extra-axial fluid collection. There is no evolved large vessel territorial infarct. There is hypodensity in the left corona radiata best seen in the coronal sequence which could reflect an acute infarct in the left MCA distribution (4-32). ASPECTS is 9 Background parenchymal volume is normal. The ventricles are normal in size. Additional hypodensity in the supratentorial  white matter is nonspecific but likely reflects sequela of underlying chronic small-vessel ischemic change. The pituitary and suprasellar region are normal. There is no mass lesion. There is no mass effect or midline shift. Vascular: There is calcification of the bilateral carotid siphons. Skull: Normal. Negative for fracture or focal lesion. Sinuses/Orbits: The paranasal sinuses are clear. The globes and orbits are unremarkable. Other: None. Review of the MIP images confirms the above findings CTA NECK FINDINGS Aortic arch: The imaged aortic arch is normal. The origins of the major branch vessels are patent. The subclavian arteries are patent to the level imaged. Right carotid system: The right common carotid artery is patent. There is mixed plaque of the bifurcation resulting in approximately 30-40% stenosis. The distal internal carotid artery is patent. The external carotid artery is patent. There is no evidence of dissection or aneurysm. Left carotid system: The left common carotid artery is patent. There is mild atherosclerotic irregularity at the bifurcation without significant stenosis or occlusion. The distal internal carotid artery is patent. The external carotid artery is patent. There is no evidence of dissection or aneurysm. Vertebral arteries: Plaque of the right vertebral artery origin resulting  in moderate stenosis. The vertebral arteries are otherwise patent, without hemodynamically significant stenosis or occlusion. There is no evidence of dissection or aneurysm. Skeleton: There is no acute osseous abnormality or suspicious osseous lesion. Other neck: The soft tissues of the neck are unremarkable. Upper chest: The imaged lung apices are clear. Review of the MIP images confirms the above findings CTA HEAD FINDINGS Anterior circulation: There is calcified plaque in the intracranial ICAs without greater than mild stenosis. The bilateral MCAs are patent, without proximal stenosis or occlusion. The  bilateral ACAs are patent, without proximal stenosis or occlusion. The anterior communicating artery is normal. There is no aneurysm or AVM. Posterior circulation: The bilateral V4 segments are patent. The basilar artery is patent. The major cerebellar arteries appear patent. The bilateral PCAs are patent, without proximal stenosis or occlusion. A small left posterior communicating artery is identified. There is no aneurysm or AVM. Venous sinuses: As permitted by contrast timing, patent. Anatomic variants: None. Review of the MIP images confirms the above findings IMPRESSION: 1. Possible acute to subacute infarct in the left corona radiata. Consider MRI for further evaluation. 2. No emergent vascular finding. 3. 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 Mild atherosclerotic irregularity in the left without significant stenosis. 4. 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 5. Patent intracranial vasculature. Findings communicated to Dr. Wilford Corner at 1:15 pm. Electronically Signed   By: Lesia Hausen M.D.   On: 10/19/2022 13:21   CT ANGIO HEAD NECK W WO CM (CODE STROKE)  Result Date: 10/19/2022 CLINICAL DATA:  Code stroke. New onset dizziness and intermittent aphasia, resolved. EXAM: CT ANGIOGRAPHY HEAD AND NECK TECHNIQUE: Multidetector CT imaging of the head and neck was performed using the standard protocol during bolus administration of intravenous contrast. Multiplanar CT image reconstructions and MIPs were obtained to evaluate the vascular anatomy. Carotid stenosis measurements (when applicable) are obtained utilizing NASCET criteria, using the distal internal carotid diameter as the denominator. RADIATION DOSE REDUCTION: This exam was performed according to the departmental dose-optimization program which includes automated exposure control, adjustment of the mA and/or kV according to patient size and/or use of iterative reconstruction technique.  CONTRAST:  75mL OMNIPAQUE IOHEXOL 350 MG/ML SOLN COMPARISON:  None Available. FINDINGS: CT HEAD FINDINGS Brain: There is no evidence of acute intracranial hemorrhage or extra-axial fluid collection. There is no evolved large vessel territorial infarct. There is hypodensity in the left corona radiata best seen in the coronal sequence which could reflect an acute infarct in the left MCA distribution (4-32). ASPECTS is 9 Background parenchymal volume is normal. The ventricles are normal in size. Additional hypodensity in the supratentorial white matter is nonspecific but likely reflects sequela of underlying chronic small-vessel ischemic change. The pituitary and suprasellar region are normal. There is no mass lesion. There is no mass effect or midline shift. Vascular: There is calcification of the bilateral carotid siphons. Skull: Normal. Negative for fracture or focal lesion. Sinuses/Orbits: The paranasal sinuses are clear. The globes and orbits are unremarkable. Other: None. Review of the MIP images confirms the above findings CTA NECK FINDINGS Aortic arch: The imaged aortic arch is normal. The origins of the major branch vessels are patent. The subclavian arteries are patent to the level imaged. Right carotid system: The right common carotid artery is patent. There is mixed plaque of the bifurcation resulting in approximately 30-40% stenosis. The distal internal carotid artery is patent. The external carotid artery is patent. There is no evidence of  dissection or aneurysm. Left carotid system: The left common carotid artery is patent. There is mild atherosclerotic irregularity at the bifurcation without significant stenosis or occlusion. The distal internal carotid artery is patent. The external carotid artery is patent. There is no evidence of dissection or aneurysm. Vertebral arteries: Plaque of the right vertebral artery origin resulting in moderate stenosis. The vertebral arteries are otherwise patent, without  hemodynamically significant stenosis or occlusion. There is no evidence of dissection or aneurysm. Skeleton: There is no acute osseous abnormality or suspicious osseous lesion. Other neck: The soft tissues of the neck are unremarkable. Upper chest: The imaged lung apices are clear. Review of the MIP images confirms the above findings CTA HEAD FINDINGS Anterior circulation: There is calcified plaque in the intracranial ICAs without greater than mild stenosis. The bilateral MCAs are patent, without proximal stenosis or occlusion. The bilateral ACAs are patent, without proximal stenosis or occlusion. The anterior communicating artery is normal. There is no aneurysm or AVM. Posterior circulation: The bilateral V4 segments are patent. The basilar artery is patent. The major cerebellar arteries appear patent. The bilateral PCAs are patent, without proximal stenosis or occlusion. A small left posterior communicating artery is identified. There is no aneurysm or AVM. Venous sinuses: As permitted by contrast timing, patent. Anatomic variants: None. Review of the MIP images confirms the above findings IMPRESSION: 1. Possible acute to subacute infarct in the left corona radiata. Consider MRI for further evaluation. 2. No emergent vascular finding. 3. 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 Mild atherosclerotic irregularity in the left without significant stenosis. 4. 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 5. Patent intracranial vasculature. Findings communicated to Dr. Wilford Corner at 1:15 pm. Electronically Signed   By: Lesia Hausen M.D.   On: 10/19/2022 13:21     Discharge Exam: Vitals:   10/21/22 0420 10/21/22 0744  BP: (!) 155/72 (!) 151/73  Pulse: (!) 58 67  Resp: 15 16  Temp: 97.7 F (36.5 C) 97.9 F (36.6 C)  SpO2: 98% 97%   Vitals:   10/20/22 1943 10/21/22 0023 10/21/22 0420 10/21/22 0744  BP: (!) 159/70 (!) 146/72 (!) 155/72 (!) 151/73   Pulse: 69 63 (!) 58 67  Resp: 18 17 15 16   Temp: (!) 97.5 F (36.4 C) (!) 97.2 F (36.2 C) 97.7 F (36.5 C) 97.9 F (36.6 C)  TempSrc: Oral Oral Oral Oral  SpO2: 96% 97% 98% 97%  Weight:      Height:        General: Pt is alert, awake, not in acute distress Cardiovascular: RRR, S1/S2 +, no rubs, no gallops Respiratory: CTA bilaterally, no wheezing, no rhonchi Abdominal: Soft, NT, ND, bowel sounds + Extremities: no edema, no cyanosis    The results of significant diagnostics from this hospitalization (including imaging, microbiology, ancillary and laboratory) are listed below for reference.     Microbiology: No results found for this or any previous visit (from the past 240 hour(s)).   Labs: BNP (last 3 results) No results for input(s): "BNP" in the last 8760 hours. Basic Metabolic Panel: Recent Labs  Lab 10/19/22 1300 10/21/22 0650  NA 134*  138 136  K 4.3  4.2 4.0  CL 101  104 105  CO2 21* 24  GLUCOSE 113*  112* 109*  BUN 24*  26* 15  CREATININE 1.41*  1.40* 1.21  CALCIUM 9.6 9.0  MG 2.1  --    Liver Function Tests: Recent Labs  Lab 10/19/22 1300  AST 37  ALT 37  ALKPHOS 78  BILITOT 0.9  PROT 7.9  ALBUMIN 4.3   No results for input(s): "LIPASE", "AMYLASE" in the last 168 hours. No results for input(s): "AMMONIA" in the last 168 hours. CBC: Recent Labs  Lab 10/19/22 1300 10/21/22 0650  WBC 12.5* 7.2  NEUTROABS 10.1* 3.9  HGB 16.3  17.7* 15.8  HCT 50.3  52.0 49.7  MCV 87.0 87.7  PLT 268 219   Cardiac Enzymes: No results for input(s): "CKTOTAL", "CKMB", "CKMBINDEX", "TROPONINI" in the last 168 hours. BNP: Invalid input(s): "POCBNP" CBG: Recent Labs  Lab 10/19/22 1253  GLUCAP 98   D-Dimer No results for input(s): "DDIMER" in the last 72 hours. Hgb A1c Recent Labs    10/19/22 1300  HGBA1C 6.0*   Lipid Profile Recent Labs    10/20/22 0103  CHOL 135  HDL 54  LDLCALC 59  TRIG 108  CHOLHDL 2.5   Thyroid function  studies No results for input(s): "TSH", "T4TOTAL", "T3FREE", "THYROIDAB" in the last 72 hours.  Invalid input(s): "FREET3" Anemia work up No results for input(s): "VITAMINB12", "FOLATE", "FERRITIN", "TIBC", "IRON", "RETICCTPCT" in the last 72 hours. Urinalysis    Component Value Date/Time   COLORURINE YELLOW 02/19/2022 1024   APPEARANCEUR CLEAR 02/19/2022 1024   LABSPEC 1.018 02/19/2022 1024   PHURINE 5.0 02/19/2022 1024   GLUCOSEU NEGATIVE 02/19/2022 1024   HGBUR NEGATIVE 02/19/2022 1024   BILIRUBINUR NEGATIVE 02/19/2022 1024   KETONESUR 5 (A) 02/19/2022 1024   PROTEINUR NEGATIVE 02/19/2022 1024   NITRITE NEGATIVE 02/19/2022 1024   LEUKOCYTESUR NEGATIVE 02/19/2022 1024   Sepsis Labs Recent Labs  Lab 10/19/22 1300 10/21/22 0650  WBC 12.5* 7.2   Microbiology No results found for this or any previous visit (from the past 240 hour(s)).   Time coordinating discharge: Over 30 minutes  SIGNED:   Hughie Closs, MD  [facility] 10/21/2022, 1:07 PM *Please note that this is a verbal dictation therefore any spelling or grammatical errors are due to the "[employer]" system interpretation. If 7PM-7AM, please contact night-coverage [email or web address]

## 2022-10-21 NOTE — Progress Notes (Signed)
Progress Note  Patient Name: Peter Roberts Date of Encounter: 10/21/2022  [facility] Cardiologist: None   Subjective   Patient admitted with chest discomfort, diaphoresis, acute stroke symptoms and found to be in A-fib with RVR on arrival in ER.  Converted to normal sinus  Coronary CTA with coronary Ca score of 585, 50-69% ostial D1, 50-69% OM1, 25-49% in mLAD/D2/pLCx and dRCA.  FFR normal with no hemodynamically flow limiting lesions.   Denies any further CP or SOB.  No palpitations.  No further palpitations  Inpatient Medications    Scheduled Meds:  docusate sodium  100 mg Oral BID   rosuvastatin  20 mg Oral QPM   sodium chloride flush  3 mL Intravenous Once   Continuous Infusions:  sodium chloride 75 mL/hr at 10/21/22 1153   diltiazem (CARDIZEM) infusion 5 mg/hr (10/20/22 1800)   PRN Meds: acetaminophen **OR** acetaminophen (TYLENOL) oral liquid 160 mg/5 mL **OR** acetaminophen   Vital Signs    Vitals:   10/20/22 1943 10/21/22 0023 10/21/22 0420 10/21/22 0744  BP: (!) 159/70 (!) 146/72 (!) 155/72 (!) 151/73  Pulse: 69 63 (!) 58 67  Resp: 18 17 15 16   Temp: (!) 97.5 F (36.4 C) (!) 97.2 F (36.2 C) 97.7 F (36.5 C) 97.9 F (36.6 C)  TempSrc: Oral Oral Oral Oral  SpO2: 96% 97% 98% 97%  Weight:      Height:        Intake/Output Summary (Last 24 hours) at 10/21/2022 1226 Last data filed at 10/21/2022 0630 Gross per 24 hour  Intake 1040.55 ml  Output 500 ml  Net 540.55 ml       10/19/2022    1:25 PM 10/19/2022   12:00 PM 08/23/2022    9:00 AM  Last 3 Weights  Weight (lbs) 182 lb 15.7 oz 184 lb 11.9 oz 184 lb 11.9 oz  Weight (kg) 83 kg 83.8 kg 83.8 kg      Telemetry    NSR  Personally Reviewed  ECG    No new EKG to review- Personally Reviewed  Physical Exam   GEN: Well nourished, well developed in no acute distress HEENT: Normal NECK: No JVD; No carotid bruits LYMPHATICS: No lymphadenopathy CARDIAC:RRR, no murmurs, rubs, gallops RESPIRATORY:   Clear to auscultation without rales, wheezing or rhonchi  ABDOMEN: Soft, non-tender, non-distended MUSCULOSKELETAL:  No edema; No deformity  SKIN: Warm and dry NEUROLOGIC:  Alert and oriented x 3 PSYCHIATRIC:  Normal affect  Labs    High Sensitivity Troponin:   Recent Labs  Lab 10/20/22 0935 10/20/22 1029  TROPONINIHS 140* 126*      Chemistry Recent Labs  Lab 10/19/22 1300 10/21/22 0650  NA 134*  138 136  K 4.3  4.2 4.0  CL 101  104 105  CO2 21* 24  GLUCOSE 113*  112* 109*  BUN 24*  26* 15  CREATININE 1.41*  1.40* 1.21  CALCIUM 9.6 9.0  PROT 7.9  --   ALBUMIN 4.3  --   AST 37  --   ALT 37  --   ALKPHOS 78  --   BILITOT 0.9  --   GFRNONAA 54* >60  ANIONGAP 12 7      Hematology Recent Labs  Lab 10/19/22 1300 10/21/22 0650  WBC 12.5* 7.2  RBC 5.78 5.67  HGB 16.3  17.7* 15.8  HCT 50.3  52.0 49.7  MCV 87.0 87.7  MCH 28.2 27.9  MCHC 32.4 31.8  RDW 14.3 14.8  PLT  268 219     BNPNo results for input(s): "BNP", "PROBNP" in the last 168 hours.   DDimer No results for input(s): "DDIMER" in the last 168 hours.   CHA2DS2-VASc Score = 5   This indicates a 7.2% annual risk of stroke. The patient's score is based upon: CHF History: 0 HTN History: 1 Diabetes History: 0 Stroke History: 2 Vascular Disease History: 1 Age Score: 1 Gender Score: 0      Radiology    CT CORONARY MORPH W/CTA COR W/SCORE W/CA W/CM &/OR WO/CM  Addendum Date: 10/20/2022   ADDENDUM REPORT: 10/20/2022 14:58 EXAM: OVER-READ INTERPRETATION  CT CHEST The following report is an over-read performed by radiologist Dr. Duanne Guess of [facility] on 10/20/2022. This over-read does not include interpretation of cardiac or coronary anatomy or pathology. The coronary CTA interpretation by the cardiologist is attached. COMPARISON:  10/19/2022 FINDINGS: Normal heart size. No pericardial effusion. Image thoracic aorta is nonaneurysmal. Central pulmonary vasculature within  normal limits. No adenopathy within the included chest. Imaged lung fields are clear. No acute bony or chest wall abnormality. 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 IMPRESSION: No acute or significant extracardiac findings. Electronically Signed   By: Duanne Guess D.O.   On: 10/20/2022 14:58   Result Date: 10/20/2022 CLINICAL DATA:  33M with chest pain EXAM: Cardiac/Coronary CTA TECHNIQUE: The patient was scanned on a [employer]. FINDINGS: A 100 kV prospective scan was triggered in the descending thoracic aorta at 111 HU's. Axial non-contrast 3 mm slices were carried out through the heart. The data set was analyzed on a dedicated work station and scored using the Agatson method. Gantry rotation speed was 250 msecs and collimation was .6 mm. No beta blockade and 0.8 mg of sl NTG was given. The 3D data set was reconstructed in 5% intervals of the 35-75% of the R-R cycle. Phases were analyzed on a dedicated work station using MPR, MIP and VRT modes. The patient received 100 cc of contrast. Coronary Arteries:  Normal coronary origin.  Right dominance. RCA is a large dominant artery that gives rise to PDA and PLA. Mixed plaque in proximal RCA causes 0-24% stenosis. Calcified plaque in distal RCA causes 25-49% stenosis. Left main is a large artery that gives rise to LAD and LCX arteries. LAD is a large vessel. Calcified plaque in proximal LAD causes 0-24% stenosis. Calcified plaque in mid LAD causes 25-49% stenosis. Calcified plaque in ostial D1 causes 50-69% stenosis. Mixed plaque in D2 causes 25-49% stenosis LCX is a non-dominant artery that gives rise to one large OM1 branch. Calcified plaque in proximal LCX causes 25-49% stenosis. Calcified plaque in OM1 causes 50-69% stenosis Other findings: Left Ventricle: Normal size Left Atrium: Mild enlargement Pulmonary Veins: Normal configuration Right Ventricle: Mild dilatation Right Atrium: Mild  enlargement Cardiac valves: No calcifications Thoracic aorta: Normal size Pulmonary Arteries: Normal size Systemic Veins: Normal drainage Pericardium: Normal thickness IMPRESSION: 1. Coronary calcium score of 585. This was 78th percentile for age and sex matched control. 2. Total plaque volume 428mm3 which is 40th percentile for age and sex-matched controls (calcified plaque 164mm3; noncalcified plaque 384mm3). TPV is severe 2. Normal coronary origin with right dominance. 3. Calcified plaque in ostial D1 causes moderate (50-69%) stenosis 4. Calcified plaque in OM1 causes moderate (50-69%) stenosis 5. Mild (25-49%) stenosis in mid LAD, D2, proximal LCX, and distal RCA 6. Will send study for CTFFR CAD-RADS 3. Moderate stenosis. Consider symptom-guided anti-ischemic pharmacotherapy as  well as risk factor modification per guideline directed care. Additional analysis with CT FFR will be submitted. Electronically Signed: By: Epifanio Lesches M.D. On: 10/20/2022 14:42   CT CORONARY FFR DATA PREP & FLUID ANALYSIS  Result Date: 10/20/2022 EXAM: FFRCT ANALYSIS FINDINGS: FFRct analysis was performed on the original cardiac CT angiogram dataset. Diagrammatic representation of the FFRct analysis is provided in a separate PDF document in PACS. This dictation was created using the PDF document and an interactive 3D model of the results. 3D model is not available in the EMR/PACS. Normal FFR range is >0.80. 1. Left Main: No significant stenosis 2. LAD: CTFFR 0.83 across lesion in mid LAD, suggesting no significant stenosis 3. LCX: CTFFR 0.92 across lesion in proximal LCX suggesting no significant stenosis 4. RCA: CTFFR 0.91 across lesion in distal RCA suggesting no significant stenosis IMPRESSION: 1.  CTFFR suggests nonobstructive CAD 2. OM1 and D1 had moderate stenosis on CTA but were not modeled on CTFFR due to small vessel size Electronically Signed   By: Epifanio Lesches M.D.   On: 10/20/2022 14:54    ECHOCARDIOGRAM COMPLETE  Result Date: 10/20/2022    ECHOCARDIOGRAM REPORT   Patient Name:   Peter Roberts Date of Exam: 10/20/2022 Medical Rec #:  [id number]    Height:       67.0 in Accession #:    [id number]   Weight:       183.0 lb Date of Birth:  1951-06-18    BSA:          1.947 m Patient Age:    71 years     BP:           131/76 mmHg Patient Gender: M            HR:           58 bpm. Exam Location:  Inpatient Procedure: 2D Echo, Cardiac Doppler, Color Doppler and Intracardiac            Opacification Agent Indications:    Stroke.  History:        Patient has no prior history of Echocardiogram examinations.                 Abnormal ECG, Stroke, Arrythmias:Atrial Fibrillation;                 Signs/Symptoms:Chest Pain.  Sonographer:    Sheralyn Boatman RDCS Referring Phys: [id number] [facility]  Sonographer Comments: Suboptimal apical window. Paused for stat lab draw IMPRESSIONS  1. Left ventricular ejection fraction, by estimation, is 60 to 65%. The left ventricle has normal function. The left ventricle has no regional wall motion abnormalities. Left ventricular diastolic parameters were normal.  2. Right ventricular systolic function is normal. The right ventricular size is normal.  3. The mitral valve is normal in structure. Trivial mitral valve regurgitation. No evidence of mitral stenosis.  4. The aortic valve was not well visualized. Aortic valve regurgitation is not visualized. No aortic stenosis is present.  5. The inferior vena cava is normal in size with greater than 50% respiratory variability, suggesting right atrial pressure of 3 mmHg. Comparison(s): No prior Echocardiogram. FINDINGS  Left Ventricle: Left ventricular ejection fraction, by estimation, is 60 to 65%. The left ventricle has normal function. The left ventricle has no regional wall motion abnormalities. Definity contrast agent was given IV to delineate the left ventricular  endocardial borders. The left ventricular internal cavity size was  normal in size. There is no left ventricular  hypertrophy. Left ventricular diastolic parameters were normal. Right Ventricle: The right ventricular size is normal. No increase in right ventricular wall thickness. Right ventricular systolic function is normal. Left Atrium: Left atrial size was normal in size. Right Atrium: Right atrial size was normal in size. Pericardium: There is no evidence of pericardial effusion. Mitral Valve: The mitral valve is normal in structure. Trivial mitral valve regurgitation. No evidence of mitral valve stenosis. Tricuspid Valve: The tricuspid valve is normal in structure. Tricuspid valve regurgitation is trivial. No evidence of tricuspid stenosis. Aortic Valve: The aortic valve was not well visualized. Aortic valve regurgitation is not visualized. No aortic stenosis is present. Pulmonic Valve: The pulmonic valve was normal in structure. Pulmonic valve regurgitation is not visualized. No evidence of pulmonic stenosis. Aorta: The aortic root and ascending aorta are structurally normal, with no evidence of dilitation. Venous: The inferior vena cava is normal in size with greater than 50% respiratory variability, suggesting right atrial pressure of 3 mmHg. IAS/Shunts: No atrial level shunt detected by color flow Doppler.  LEFT VENTRICLE PLAX 2D LVIDd:         4.30 cm   Diastology LVIDs:         2.80 cm   LV e' medial:    8.27 cm/s LV PW:         0.90 cm   LV E/e' medial:  9.4 LV IVS:        1.00 cm   LV e' lateral:   10.40 cm/s LVOT diam:     2.40 cm   LV E/e' lateral: 7.5 LV SV:         88 LV SV Index:   45 LVOT Area:     4.52 cm  RIGHT VENTRICLE             IVC RV S prime:     14.60 cm/s  IVC diam: 1.60 cm TAPSE (M-mode): 2.2 cm LEFT ATRIUM             Index        RIGHT ATRIUM           Index LA diam:        3.90 cm 2.00 cm/m   RA Area:     15.80 cm LA Vol (A2C):   42.6 ml 21.88 ml/m  RA Volume:   34.60 ml  17.77 ml/m LA Vol (A4C):   27.0 ml 13.87 ml/m LA Biplane Vol: 33.7 ml  17.31 ml/m  AORTIC VALVE LVOT Vmax:   90.70 cm/s LVOT Vmean:  56.600 cm/s LVOT VTI:    0.195 m  AORTA Ao Root diam: 2.90 cm Ao Asc diam:  3.10 cm MITRAL VALVE MV Area (PHT): 3.42 cm    SHUNTS MV Decel Time: 222 msec    Systemic VTI:  0.20 m MV E velocity: 78.10 cm/s  Systemic Diam: 2.40 cm MV A velocity: 81.20 cm/s MV E/A ratio:  0.96 Riley Lam MD Electronically signed by Riley Lam MD Signature Date/Time: 10/20/2022/1:21:47 PM    Final    MR BRAIN WO CONTRAST  Result Date: 10/19/2022 CLINICAL DATA:  Transient ischemic attack. Neuro deficit, acute, stroke suspected. EXAM: MRI HEAD WITHOUT CONTRAST TECHNIQUE: Multiplanar, multiecho pulse sequences of the brain and surrounding structures were obtained without intravenous contrast. COMPARISON:  CTA head/neck 10/19/2022. FINDINGS: Brain: No acute infarct or hemorrhage. Moderate chronic small-vessel disease. No hydrocephalus or extra-axial collection. No foci of abnormal susceptibility. No mass or midline shift. Vascular: Normal flow voids. Skull and  upper cervical spine: Normal marrow signal. Sinuses/Orbits: Unremarkable. Other: None. IMPRESSION: 1. No acute intracranial process. 2. Moderate chronic small-vessel disease. Electronically Signed   By: Orvan Falconer M.D.   On: 10/19/2022 21:22   CT Angio Chest PE W and/or Wo Contrast  Result Date: 10/19/2022 CLINICAL DATA:  History of prostate cancer. EXAM: CT ANGIOGRAPHY CHEST WITH CONTRAST TECHNIQUE: Multidetector CT imaging of the chest was performed using the standard protocol during bolus administration of intravenous contrast. Multiplanar CT image reconstructions and MIPs were obtained to evaluate the vascular anatomy. RADIATION DOSE REDUCTION: This exam was performed according to the departmental dose-optimization program which includes automated exposure control, adjustment of the mA and/or kV according to patient size and/or use of iterative reconstruction technique. CONTRAST:  75mL  OMNIPAQUE IOHEXOL 350 MG/ML SOLN COMPARISON:  None Available. FINDINGS: Cardiovascular: Significant breathing motion seen throughout the examination. This limits evaluation for pulmonary emboli. Nondiagnostic for small and peripheral emboli. No large or central embolus. Normal caliber thoracic aorta with mild calcified plaque. Coronary artery calcifications are seen. The heart is nonenlarged. No pericardial effusion. Mediastinum/Nodes: Slightly patulous thoracic esophagus. No specific abnormal lymph node enlargement seen in the axillary region, hilum or mediastinum. There are some small lymph nodes identified in the right lung hilum which are less than a cm in short axis and not pathologic by size criteria. Lungs/Pleura: Linear opacity lung bases likely scar or atelectasis. No consolidation, pneumothorax or effusion. Upper Abdomen: Along the upper abdomen the adrenal glands are preserved. There is some high density noted within the left kidney. Please correlate with prior CT angiogram of the head from same day Musculoskeletal: Diffuse degenerative changes of the spine. There is multilevel Schmorl's node changes with bridging osteophytes and syndesmophytes. Mild compression noted along the lower thoracic spine vertebral levels. Review of the MIP images confirms the above findings. IMPRESSION: Extensive breathing motion.  No large or central embolus. Aortic Atherosclerosis (ICD10-I70.0). Electronically Signed   By: Karen Kays M.D.   On: 10/19/2022 17:25   CT HEAD CODE STROKE WO CONTRAST  Result Date: 10/19/2022 CLINICAL DATA:  Code stroke. New onset dizziness and intermittent aphasia, resolved. EXAM: CT ANGIOGRAPHY HEAD AND NECK TECHNIQUE: Multidetector CT imaging of the head and neck was performed using the standard protocol during bolus administration of intravenous contrast. Multiplanar CT image reconstructions and MIPs were obtained to evaluate the vascular anatomy. Carotid stenosis measurements (when  applicable) are obtained utilizing NASCET criteria, using the distal internal carotid diameter as the denominator. RADIATION DOSE REDUCTION: This exam was performed according to the departmental dose-optimization program which includes automated exposure control, adjustment of the mA and/or kV according to patient size and/or use of iterative reconstruction technique. CONTRAST:  75mL OMNIPAQUE IOHEXOL 350 MG/ML SOLN COMPARISON:  None Available. FINDINGS: CT HEAD FINDINGS Brain: There is no evidence of acute intracranial hemorrhage or extra-axial fluid collection. There is no evolved large vessel territorial infarct. There is hypodensity in the left corona radiata best seen in the coronal sequence which could reflect an acute infarct in the left MCA distribution (4-32). ASPECTS is 9 Background parenchymal volume is normal. The ventricles are normal in size. Additional hypodensity in the supratentorial white matter is nonspecific but likely reflects sequela of underlying chronic small-vessel ischemic change. The pituitary and suprasellar region are normal. There is no mass lesion. There is no mass effect or midline shift. Vascular: There is calcification of the bilateral carotid siphons. Skull: Normal. Negative for fracture or focal lesion. Sinuses/Orbits: The paranasal sinuses are  clear. The globes and orbits are unremarkable. Other: None. Review of the MIP images confirms the above findings CTA NECK FINDINGS Aortic arch: The imaged aortic arch is normal. The origins of the major branch vessels are patent. The subclavian arteries are patent to the level imaged. Right carotid system: The right common carotid artery is patent. There is mixed plaque of the bifurcation resulting in approximately 30-40% stenosis. The distal internal carotid artery is patent. The external carotid artery is patent. There is no evidence of dissection or aneurysm. Left carotid system: The left common carotid artery is patent. There is mild  atherosclerotic irregularity at the bifurcation without significant stenosis or occlusion. The distal internal carotid artery is patent. The external carotid artery is patent. There is no evidence of dissection or aneurysm. Vertebral arteries: Plaque of the right vertebral artery origin resulting in moderate stenosis. The vertebral arteries are otherwise patent, without hemodynamically significant stenosis or occlusion. There is no evidence of dissection or aneurysm. Skeleton: There is no acute osseous abnormality or suspicious osseous lesion. Other neck: The soft tissues of the neck are unremarkable. Upper chest: The imaged lung apices are clear. Review of the MIP images confirms the above findings CTA HEAD FINDINGS Anterior circulation: There is calcified plaque in the intracranial ICAs without greater than mild stenosis. The bilateral MCAs are patent, without proximal stenosis or occlusion. The bilateral ACAs are patent, without proximal stenosis or occlusion. The anterior communicating artery is normal. There is no aneurysm or AVM. Posterior circulation: The bilateral V4 segments are patent. The basilar artery is patent. The major cerebellar arteries appear patent. The bilateral PCAs are patent, without proximal stenosis or occlusion. A small left posterior communicating artery is identified. There is no aneurysm or AVM. Venous sinuses: As permitted by contrast timing, patent. Anatomic variants: None. Review of the MIP images confirms the above findings IMPRESSION: 1. Possible acute to subacute infarct in the left corona radiata. Consider MRI for further evaluation. 2. No emergent vascular finding. 3. 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 Mild atherosclerotic irregularity in the left without significant stenosis. 4. 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 5. Patent intracranial vasculature. Findings communicated to Dr. Wilford Corner at 1:15 pm.  Electronically Signed   By: Lesia Hausen M.D.   On: 10/19/2022 13:21   CT ANGIO HEAD NECK W WO CM (CODE STROKE)  Result Date: 10/19/2022 CLINICAL DATA:  Code stroke. New onset dizziness and intermittent aphasia, resolved. EXAM: CT ANGIOGRAPHY HEAD AND NECK TECHNIQUE: Multidetector CT imaging of the head and neck was performed using the standard protocol during bolus administration of intravenous contrast. Multiplanar CT image reconstructions and MIPs were obtained to evaluate the vascular anatomy. Carotid stenosis measurements (when applicable) are obtained utilizing NASCET criteria, using the distal internal carotid diameter as the denominator. RADIATION DOSE REDUCTION: This exam was performed according to the departmental dose-optimization program which includes automated exposure control, adjustment of the mA and/or kV according to patient size and/or use of iterative reconstruction technique. CONTRAST:  75mL OMNIPAQUE IOHEXOL 350 MG/ML SOLN COMPARISON:  None Available. FINDINGS: CT HEAD FINDINGS Brain: There is no evidence of acute intracranial hemorrhage or extra-axial fluid collection. There is no evolved large vessel territorial infarct. There is hypodensity in the left corona radiata best seen in the coronal sequence which could reflect an acute infarct in the left MCA distribution (4-32). ASPECTS is 9 Background parenchymal volume is normal. The ventricles are normal in size. Additional hypodensity in the  supratentorial white matter is nonspecific but likely reflects sequela of underlying chronic small-vessel ischemic change. The pituitary and suprasellar region are normal. There is no mass lesion. There is no mass effect or midline shift. Vascular: There is calcification of the bilateral carotid siphons. Skull: Normal. Negative for fracture or focal lesion. Sinuses/Orbits: The paranasal sinuses are clear. The globes and orbits are unremarkable. Other: None. Review of the MIP images confirms the above  findings CTA NECK FINDINGS Aortic arch: The imaged aortic arch is normal. The origins of the major branch vessels are patent. The subclavian arteries are patent to the level imaged. Right carotid system: The right common carotid artery is patent. There is mixed plaque of the bifurcation resulting in approximately 30-40% stenosis. The distal internal carotid artery is patent. The external carotid artery is patent. There is no evidence of dissection or aneurysm. Left carotid system: The left common carotid artery is patent. There is mild atherosclerotic irregularity at the bifurcation without significant stenosis or occlusion. The distal internal carotid artery is patent. The external carotid artery is patent. There is no evidence of dissection or aneurysm. Vertebral arteries: Plaque of the right vertebral artery origin resulting in moderate stenosis. The vertebral arteries are otherwise patent, without hemodynamically significant stenosis or occlusion. There is no evidence of dissection or aneurysm. Skeleton: There is no acute osseous abnormality or suspicious osseous lesion. Other neck: The soft tissues of the neck are unremarkable. Upper chest: The imaged lung apices are clear. Review of the MIP images confirms the above findings CTA HEAD FINDINGS Anterior circulation: There is calcified plaque in the intracranial ICAs without greater than mild stenosis. The bilateral MCAs are patent, without proximal stenosis or occlusion. The bilateral ACAs are patent, without proximal stenosis or occlusion. The anterior communicating artery is normal. There is no aneurysm or AVM. Posterior circulation: The bilateral V4 segments are patent. The basilar artery is patent. The major cerebellar arteries appear patent. The bilateral PCAs are patent, without proximal stenosis or occlusion. A small left posterior communicating artery is identified. There is no aneurysm or AVM. Venous sinuses: As permitted by contrast timing, patent.  Anatomic variants: None. Review of the MIP images confirms the above findings IMPRESSION: 1. Possible acute to subacute infarct in the left corona radiata. Consider MRI for further evaluation. 2. No emergent vascular finding. 3. 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 Mild atherosclerotic irregularity in the left without significant stenosis. 4. 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 5. Patent intracranial vasculature. Findings communicated to Dr. Wilford Corner at 1:15 pm. Electronically Signed   By: Lesia Hausen M.D.   On: 10/19/2022 13:21    Patient Profile     72 y.o. male with a hx of HLD, HTN, prostate CA, carotid artery stenosis s/p left CEA 9/23  who is being seen today for the evaluation of afib with RVR at the request of Hervey Ard, MD.   Assessment & Plan    New Onset Atrial Fibrillation with RVR Unknown duration -patient had no palpitations but complained of onset of CP while out for a walk and dizziness -found to be in afib with RVR in ER -Started on IV Cardizem drip and heparin drip -converted to sinus bradycardia -CHADS2VASC score 5 (age>65,HTN,PAD,CVA) -2D echo with EF 60-65% with trivial MR -Check TSH  -K+ 4 -d/c IV Cardizem gtt -start Toprol XL 25mg  daily instead of PO Cardizem given his underlying CAD -recommend outpt sleep study -transition IV Heparin to Eliquis  5mg  BID if sigmoidoscopy ok -followup in afib clinic   2.  Chest pain/CAD -likely related to demand ischemia in the setting of afib with RVR -chest CTA with coronary artery calcifications -hsTrop mildly elevated but flat trend 140>>126 and likely related to demand ischemia in setting of afib with RVR -2D echo normal -coronary CTA with coronary Ca score of 585, 50-69% ostial D1, 50-69% OM1, 25-49% in mLAD/D2/pLCx and dRCA.  FFR normal with no hemodynamically flow limiting lesions.  -FLP this admission showed LDL 59, HDL 54 and triglycerides  784 -He was on Crestor 20 mg at home but has been transition to atorvastatin 80 mg daily.  Since LDL is at goal will transition back to Crestor 20 mg daily    3.  HTN -BP elevated at 163/58mmHg on arrival -BP mildly elevated at 151/17mHg -He was on amlodipine 5 mg daily at home but will stop since adding Toprol   4.  Carotid artery stenosis -s/p left CEA 9/23 -CT of head and neck today with 30-40% right carotid bifurcation stenosis and mild atherosclerosis of the left, moderate stenosis of right vertebral artery origin -continue statin -he has not been on ASA due to rectal bleeding   5.  Rectal bleeding -felt related to proctitis from his prostate CA - sigmoidoscopy today -per TRH  7.  TIA -admitted with stroke like sx and CT with ? CVA but MRI with no CVA -per Neuro     [facility] will sign off.   Medication Recommendations:  Crestor 20mg  daily, Toprol XL 25mg  daily and Eliquis 5mg  BID Other recommendations (labs, testing, etc):  outpatient sleep study Follow up as an outpatient:  Afib clinic in 7-10 days and me in 4 weeks  I have spent a total of35 minutes with patient reviewing MRI , 2D echo telemetry, EKGs, labs and examining patient as well as establishing an assessment and plan that was discussed with the patient.  > 50% of time was spent in direct patient care.    For questions or updates, please contact [facility] Please consult [email or web address] for contact info under        Signed, Armanda Magic, MD  10/21/2022, 12:26 PM

## 2022-10-21 NOTE — Progress Notes (Signed)
Pt transported to endo.  

## 2022-10-21 NOTE — Progress Notes (Signed)
Pt requesting to be discharged.  MD notified.  Order to re-check BP and if same or better, may proceed with discharge.  Pt aware.

## 2022-10-21 NOTE — Plan of Care (Signed)

## 2022-10-21 NOTE — Interval H&P Note (Signed)
History and Physical Interval Note:  10/21/2022 3:58 PM  Peter Roberts  has presented today for surgery, with the diagnosis of Abnormal CT imaging, blood per rectum.  The various methods of treatment have been discussed with the patient and family. After consideration of risks, benefits and other options for treatment, the patient has consented to  Procedure(s): FLEXIBLE SIGMOIDOSCOPY (N/A) as a surgical intervention.  The patient's history has been reviewed, patient examined, no change in status, stable for surgery.  I have reviewed the patient's chart and labs.  Questions were answered to the patient's satisfaction.     Lynann Bologna

## 2022-10-22 NOTE — Anesthesia Postprocedure Evaluation (Signed)
Anesthesia Post Note  Patient: Peter Roberts  Procedure(s) Performed: FLEXIBLE SIGMOIDOSCOPY BIOPSY     Patient location during evaluation: Endoscopy Anesthesia Type: MAC Level of consciousness: awake and alert Pain management: pain level controlled Vital Signs Assessment: post-procedure vital signs reviewed and stable Respiratory status: spontaneous breathing, nonlabored ventilation, respiratory function stable and patient connected to nasal cannula oxygen Cardiovascular status: blood pressure returned to baseline and stable Postop Assessment: no apparent nausea or vomiting Anesthetic complications: no  No notable events documented.  Last Vitals:  Vitals:   10/21/22 1740 10/21/22 1826  BP: (!) 190/72 (!) 160/89  Pulse:  65  Resp:    Temp:    SpO2:  100%    Last Pain:  Vitals:   10/21/22 1735  TempSrc:   PainSc: 0-No pain                 Trevor Iha

## 2022-10-26 ENCOUNTER — Encounter (HOSPITAL_COMMUNITY): Payer: Self-pay | Admitting: Internal Medicine

## 2022-10-26 LAB — SURGICAL PATHOLOGY

## 2022-10-28 ENCOUNTER — Other Ambulatory Visit: Payer: Self-pay

## 2022-10-28 ENCOUNTER — Ambulatory Visit (HOSPITAL_COMMUNITY)
Admit: 2022-10-28 | Discharge: 2022-10-28 | Disposition: A | Discharge status: Home / Self Care | Payer: Medicare Other | Attending: Internal Medicine | Admitting: Internal Medicine

## 2022-10-28 VITALS — BP 118/66 | HR 67 | Ht 67.0 in | Wt 182.8 lb

## 2022-10-28 DIAGNOSIS — N1831 Chronic kidney disease, stage 3a: Secondary | ICD-10-CM | POA: Insufficient documentation

## 2022-10-28 DIAGNOSIS — D6869 Other thrombophilia: Secondary | ICD-10-CM | POA: Diagnosis not present

## 2022-10-28 DIAGNOSIS — I480 Paroxysmal atrial fibrillation: Secondary | ICD-10-CM

## 2022-10-28 DIAGNOSIS — I129 Hypertensive chronic kidney disease with stage 1 through stage 4 chronic kidney disease, or unspecified chronic kidney disease: Secondary | ICD-10-CM | POA: Insufficient documentation

## 2022-10-28 DIAGNOSIS — Z6828 Body mass index (BMI) 28.0-28.9, adult: Secondary | ICD-10-CM | POA: Insufficient documentation

## 2022-10-28 DIAGNOSIS — E669 Obesity, unspecified: Secondary | ICD-10-CM | POA: Insufficient documentation

## 2022-10-28 DIAGNOSIS — I4891 Unspecified atrial fibrillation: Secondary | ICD-10-CM | POA: Diagnosis not present

## 2022-10-28 DIAGNOSIS — Z7901 Long term (current) use of anticoagulants: Secondary | ICD-10-CM | POA: Diagnosis not present

## 2022-10-28 MED ORDER — METOPROLOL SUCCINATE ER 25 MG PO TB24: 25.0000 mg | ORAL_TABLET | Freq: Every day | ORAL | 3 refills | Status: DC

## 2022-10-28 MED ORDER — APIXABAN 5 MG PO TABS: 5.0000 mg | ORAL_TABLET | Freq: Two times a day (BID) | ORAL | 3 refills | Status: DC

## 2022-10-28 NOTE — Progress Notes (Signed)
Primary Care Physician: Alyson Ingles, PA-C Primary Cardiologist: None Primary Electrophysiologist: None Referring Physician: Dr. Charlett Nose Peter Roberts is a 72 y.o. male with a history of HTN, CAD, HLD, stage IIIa CKD, carotid artery stenosis s/p left CEA 9/23, prostate cancer, and atrial fibrillation who presents for consultation in the [facility]. The patient was admitted from 4/29-10/21/22 with initial symptoms of chest pain and dizziness. He was scheduled to have sigmoidoscopy on day of presentation due to rectal bleeding noted in March. When EMS arrived, he had difficulty speaking and facial droop but both resolved upon arrival to ED. He arrived in ED in atrial fibrillation with RVR. CT showed possible acute to subacute infarct in the left corona radiata. Neurology ordered MRI which was negative for stroke. Sigmoidoscopy performed during admission to avoid being off anticoagulation. Coronary CTA with coronary calcium score of 585. He converted to NSR on 4/30 and began Toprol 25 mg daily on 5/1; not on cardizem given underlying CAD. He was discharged on Eliquis 5 mg BID. Held dose of Eliquis night of 5/1 and advised to resume 5/2 if no rectal bleeding. Chest pain thought to be secondary to demand ischemia. Patient is on Eliquis 5 mg BID for a CHADS2VASC score of 5.  On evaluation today, he is currently in SR. As noted above, he felt chest pain and dizziness when he was in Afib. He felt rapid heart rate when he was in the ED. He feels well overall at this time; still not sure what caused him to have Afib. He denies any current chest pain or shortness of breath. He is compliant with Toprol 25 mg qhs.   He is compliant with anticoagulation and has not missed any doses. He has no bleeding concerns.  He does not drink alcohol or caffeinated beverages. He does not believe he snores or stops breathing at night.   Today, he denies symptoms of palpitations, chest pain,  shortness of breath, orthopnea, PND, lower extremity edema, dizziness, presyncope, syncope, snoring, daytime somnolence, bleeding, or neurologic sequela. The patient is tolerating medications without difficulties and is otherwise without complaint today.   Atrial Fibrillation Risk Factors:  he does not have symptoms or diagnosis of sleep apnea. he does not have a history of rheumatic fever. he does not have a history of alcohol use. The patient does not have a history of early familial atrial fibrillation or other arrhythmias.  he has a BMI of Body mass index is 28.63 kg/m.Marland Kitchen Filed Weights   10/28/22 1317  Weight: 82.9 kg    Family History  Problem Relation Age of Onset   Lung disease Mother    Heart attack Father    Heart disease Father      Atrial Fibrillation Management history:  Previous antiarrhythmic drugs: None Previous cardioversions: None Previous ablations: None Anticoagulation history: Eliquis 5 mg BID   Past Medical History:  Diagnosis Date   Arthritis    BPH associated with nocturia    Carotid stenosis, asymptomatic, bilateral 2017   followed by pcp and vascular dr Chestine Spore;    s/p left CEA 02-25-2022 for stenosis >80%; (post op note in epic 03-17-2022 healing well)   last duplex in epic 02-03-2022 right ICA 40-59% and left ICA 80-99%   Elevated blood pressure reading without diagnosis of hypertension    Family history of prostate cancer    History of anal fissures    Hyperlipidemia    Malignant neoplasm prostate (HCC) 04/2022  urologist--- dr newsome/  radiation oncologist--- dr Kathrynn Running;  dx 11/ 2023,  Gleason 4+3   Microcytic anemia    Past Surgical History:  Procedure Laterality Date   BIOPSY  10/21/2022   Procedure: BIOPSY;  Surgeon: Lynann Bologna, DO;  Location: [facility] ENDOSCOPY;  Service: Gastroenterology;;   CARPAL TUNNEL RELEASE Left 12/2021   COLONOSCOPY  2019   ENDARTERECTOMY Left 02/25/2022   Procedure: LEFT CAROTID ENDARTERECTOMY;  Surgeon:  Cephus Shelling, MD;  Location: [facility] OR;  Service: Vascular;  Laterality: Left;   FLEXIBLE SIGMOIDOSCOPY N/A 10/21/2022   Procedure: FLEXIBLE SIGMOIDOSCOPY;  Surgeon: Lynann Bologna, DO;  Location: [facility] ENDOSCOPY;  Service: Gastroenterology;  Laterality: N/A;   KNEE ARTHROSCOPY Right 2002    Current Outpatient Medications  Medication Sig Dispense Refill   Cholecalciferol (VITAMIN D3) 125 MCG (5000 UT) capsule Take 5,000 Units by mouth daily.     diclofenac Sodium (VOLTAREN) 1 % GEL Apply 4 g topically 4 (four) times daily as needed (pain).     Ferrous Sulfate (IRON) 90 (18 Fe) MG TABS Take 90 mg by mouth every evening.     Magnesium 500 MG CAPS Take 1 capsule by mouth every evening.     Oil of Oregano 1500 MG CAPS Take 1 capsule by mouth daily.     OVER THE COUNTER MEDICATION Take 3 capsules by mouth daily after lunch. Prostagenix     Probiotic Product (PROBIOTIC DAILY) CAPS Take 1 capsule by mouth daily.     rosuvastatin (CRESTOR) 20 MG tablet Take 1 tablet (20 mg total) by mouth every evening. (Patient taking differently: Take 20 mg by mouth every evening.) 90 tablet 2   apixaban (ELIQUIS) 5 MG TABS tablet Take 1 tablet (5 mg total) by mouth 2 (two) times daily. 60 tablet 3   metoprolol succinate (TOPROL-XL) 25 MG 24 hr tablet Take 1 tablet (25 mg total) by mouth daily. 30 tablet 3   OVER THE COUNTER MEDICATION Take 1 tablet by mouth daily. Circ O2 (Patient not taking: Reported on 10/28/2022)     No current facility-administered medications for this encounter.    Allergies  Allergen Reactions   Sulfa Antibiotics Hives    Social History   Socioeconomic History   Marital status: Widowed    Spouse name: Not on file   Number of children: Not on file   Years of education: Not on file   Highest education level: Not on file  Occupational History   Not on file  Tobacco Use   Smoking status: Never    Passive exposure: Never   Smokeless tobacco: Never  Vaping Use   Vaping Use:  Never used  Substance and Sexual Activity   Alcohol use: No   Drug use: Never   Sexual activity: Not on file  Other Topics Concern   Not on file  Social History Narrative   Not on file   Social Determinants of Health   Financial Resource Strain: Not on file  Food Insecurity: No Food Insecurity (10/19/2022)   Hunger Vital Sign    Worried About Running Out of Food in the Last Year: Never true    Ran Out of Food in the Last Year: Never true  Transportation Needs: No Transportation Needs (10/19/2022)   PRAPARE - Administrator, Civil Service (Medical): No    Lack of Transportation (Non-Medical): No  Physical Activity: Not on file  Stress: Not on file  Social Connections: Not on file  Intimate Partner Violence:  Not At Risk (10/19/2022)   Humiliation, Afraid, Rape, and Kick questionnaire    Fear of Current or Ex-Partner: No    Emotionally Abused: No    Physically Abused: No    Sexually Abused: No     ROS- All systems are reviewed and negative except as per the HPI above.  Physical Exam: Vitals:   10/28/22 1317  BP: 118/66  Pulse: 67  Weight: 82.9 kg  Height: 5\' 7"  (1.702 m)    GEN- The patient is a well appearing male, alert and oriented x 3 today.   Head- normocephalic, atraumatic Eyes-  Sclera clear, conjunctiva pink Ears- hearing intact Oropharynx- clear Neck- supple  Lungs- Clear to ausculation bilaterally, normal work of breathing Heart- Regular rate and rhythm, no murmurs, rubs or gallops  GI- soft, NT, ND, + BS Extremities- no clubbing, cyanosis, or edema MS- no significant deformity or atrophy Skin- no rash or lesion Psych- euthymic mood, full affect Neuro- strength and sensation are intact  Wt Readings from Last 3 Encounters:  10/28/22 82.9 kg  10/21/22 83 kg  08/23/22 83.8 kg    EKG today demonstrates  Vent. rate 67 BPM PR interval 122 ms QRS duration 88 ms QT/QTcB 382/403 ms P-R-T axes 54 66 18 Normal sinus rhythm Normal ECG When  compared with ECG of 19-Feb-2022 10:39, PREVIOUS ECG IS PRESENT  Echo 10/20/22 demonstrated:  1. Left ventricular ejection fraction, by estimation, is 60 to 65%. The  left ventricle has normal function. The left ventricle has no regional  wall motion abnormalities. Left ventricular diastolic parameters were  normal.   2. Right ventricular systolic function is normal. The right ventricular  size is normal.   3. The mitral valve is normal in structure. Trivial mitral valve  regurgitation. No evidence of mitral stenosis.   4. The aortic valve was not well visualized. Aortic valve regurgitation  is not visualized. No aortic stenosis is present.   5. The inferior vena cava is normal in size with greater than 50%  respiratory variability, suggesting right atrial pressure of 3 mmHg.  Epic records are reviewed at length today.  CHA2DS2-VASc Score = 5  The patient's score is based upon: CHF History: 0 HTN History: 1 Diabetes History: 0 Stroke History: 2 Vascular Disease History: 1 Age Score: 1 Gender Score: 0       ASSESSMENT AND PLAN: Paroxysmal Atrial Fibrillation (ICD10:  I48.0) The patient's CHA2DS2-VASc score is 5, indicating a 7.2% annual risk of stroke.    Education provided about Afib with visual diagram. He has a watch that shows HR; recommended additional devices to help monitor rhythm. After discussion, we will proceed with conservative observation at this time.  Continue Toprol 25 mg qhs.  Will obtain CBC and TSH at follow up visit unless done sooner.   2. Secondary Hypercoagulable State (ICD10:  D68.69) The patient is at significant risk for stroke/thromboembolism based upon his CHA2DS2-VASc Score of 5.  Continue Apixaban (Eliquis).   Education on anticoagulation and necessity for compliance provided.  3. Obesity Body mass index is 28.63 kg/m. Lifestyle modification was discussed at length including regular exercise and weight reduction. Continue with daily  activity as tolerated.    Follow up in 2 months Afib clinic.    Lake Bells, PA-C Afib Clinic [facility] [address] [place], [place] [address] [phone] 10/28/2022 2:12 PM

## 2022-11-10 NOTE — Progress Notes (Signed)
RN spoke with patient to confirm upcoming PMDC appointment on 5/28.

## 2022-11-17 ENCOUNTER — Encounter: Payer: Self-pay | Admitting: Radiation Oncology

## 2022-11-17 ENCOUNTER — Other Ambulatory Visit: Payer: Self-pay

## 2022-11-17 ENCOUNTER — Other Ambulatory Visit: Payer: Self-pay | Admitting: Genetic Counselor

## 2022-11-17 ENCOUNTER — Inpatient Hospital Stay: Payer: Medicare Other

## 2022-11-17 ENCOUNTER — Inpatient Hospital Stay: Payer: Medicare Other | Attending: Genetic Counselor | Admitting: Genetic Counselor

## 2022-11-17 ENCOUNTER — Encounter: Payer: Self-pay | Admitting: Genetic Counselor

## 2022-11-17 ENCOUNTER — Ambulatory Visit
Admission: RE | Admit: 2022-11-17 | Discharge: 2022-11-17 | Disposition: A | Discharge status: Home / Self Care | Payer: Medicare Other | Source: Ambulatory Visit | Attending: Radiation Oncology | Admitting: Radiation Oncology

## 2022-11-17 VITALS — BP 110/61 | HR 63 | Temp 97.5°F | Resp 18 | Ht 67.0 in | Wt 185.8 lb

## 2022-11-17 DIAGNOSIS — Z1379 Encounter for other screening for genetic and chromosomal anomalies: Secondary | ICD-10-CM

## 2022-11-17 DIAGNOSIS — Z803 Family history of malignant neoplasm of breast: Secondary | ICD-10-CM | POA: Diagnosis not present

## 2022-11-17 DIAGNOSIS — Z8042 Family history of malignant neoplasm of prostate: Secondary | ICD-10-CM | POA: Diagnosis not present

## 2022-11-17 DIAGNOSIS — C61 Malignant neoplasm of prostate: Secondary | ICD-10-CM

## 2022-11-17 LAB — GENETIC SCREENING ORDER

## 2022-11-17 NOTE — Consult Note (Signed)
Multi-Disciplinary Clinic     11/17/2022   --------------------------------------------------------------------------------   Peter Roberts  MRN: [id number]  DOB: 01-Mar-1951, 72 year old Male  SSN:    PRIMARY CARE:  Molly Maduro A. Nicholos Johns Retired, MD  PRIMARY CARE FAX:  [phone]  REFERRING:  Belva Agee, MD  PROVIDER:  Karoline Caldwell, M.D.  TREATING:  Heloise Purpura, M.D.  LOCATION:  [facility] [phone]     --------------------------------------------------------------------------------   CC/HPI: CC: Prostate Cancer   Physician requesting consult: Dr.Wynn Benancio Deeds  PCP: Dr. Elias Else  Location of consult: [facility] Prostate Cancer Multidisciplinary Clinic   Peter Roberts is a 72 year old gentleman with a history of coronary artery disease, carotid artery stenosis s/p L CEA, hyperlipidemia, hypertension, and atrial fibrillation (on Eliquis). who was noted to have an elevated PSA of 6.35. He underwent a TRUS biopsy of the prostate on 04/24/22 that demonstrated Gleason 4+3=7 adenocarcinoma of the prostate with 4 out of 12 biopsy cores positive for malignancy. PSMA PET imaging was performed on 06/01/22 and was negative for metastatic disease. He was apparently going to proceed with IMRT but just before he was to undergo SpaceOAR and fiducial marker placement, he was admitted to the hospital with rectal bleeding. Flexible sigmoidoscopy was normal. He was started on anticoagulation due to a CHADS score of 5 placing him at high risk for thromboembolic disease.   Family history: Two brothers.   Imaging studies: PSMA PET scan (06/01/22) - Negative for metastatic disease.   PMH: He has a history of coronary artery disease, carotid artery stenosis s/p L CEA, hyperlipidemia, hypertension, and atrial fibrillation (on Eliquis)  PSH: No abdominal surgeries.   TNM stage: cT1c N0 M0  PSA: 6.35  Gleason score: 4+3=7 (GG 3)  Biopsy (04/24/22): 4/12 cores  positive  Left: Benign  Right: R mid (20%, 3+4=7), R lateral mid (5%, 3+3=6), R base (40%, 4+3=7), R lateral base (50%, 4+3=7)  Prostate volume: 32.9 cc   Nomogram  OC disease: 44%  EPE: 54%  SVI: 10%  LNI: 13%  PFS (5 year, 10 year): 52%, 36%   Urinary function: IPSS is 13.  Erectile function: SHIM score is 16.     ALLERGIES: Sulfa - Hives, Skin Rash    MEDICATIONS: Levofloxacin 750 mg tablet Take 1 tab po 1 hour prior to procedure  Rosuvastatin Calcium  Vital Psa  Vitamin D3     GU PSH: Prostate Needle Biopsy - 04/24/2022     NON-GU PSH: Knee Arthroscopy/surgery Surgical Pathology, Gross And Microscopic Examination For Prostate Needle - 04/24/2022     GU PMH: Family Hx of Prostate Cancer - 07/10/2022, - 05/04/2022, - 03/06/2022, - 09/01/2021, - 01/31/2021 Prostate Cancer - 07/10/2022, - 05/04/2022 Elevated PSA - 04/24/2022, - 03/06/2022, - 09/01/2021, - 01/31/2021    NON-GU PMH: Hypercholesterolemia Hypertension    FAMILY HISTORY: Heart Attack - Father Heart Disease - Father High Blood Pressure - Runs in Family, Sister Lung trouble - Mother   SOCIAL HISTORY: Marital Status: Widowed Preferred Language: English; Ethnicity: Not Hispanic Or Latino; Race: White Current Smoking Status: Patient has never smoked.   Tobacco Use Assessment Completed: Used Tobacco in last 30 days? Does not use smokeless tobacco. Does not use drugs. Drinks 2 caffeinated drinks per day. Has not had a blood transfusion.    REVIEW OF SYSTEMS:    GU Review Male:   Patient denies frequent urination, hard to postpone urination, burning/ pain with urination,  get up at night to urinate, leakage of urine, stream starts and stops, trouble starting your streams, and have to strain to urinate .  Gastrointestinal (Upper):   Patient denies nausea and vomiting.  Gastrointestinal (Lower):   Patient denies diarrhea and constipation.  Constitutional:   Patient denies fever, night sweats, weight loss, and  fatigue.  Skin:   Patient denies skin rash/ lesion and itching.  Eyes:   Patient denies blurred vision and double vision.  Ears/ Nose/ Throat:   Patient denies sore throat and sinus problems.  Hematologic/Lymphatic:   Patient denies swollen glands and easy bruising.  Cardiovascular:   Patient denies leg swelling and chest pains.  Respiratory:   Patient denies cough and shortness of breath.  Endocrine:   Patient denies excessive thirst.  Musculoskeletal:   Patient denies back pain and joint pain.  Neurological:   Patient denies headaches and dizziness.  Psychologic:   Patient denies depression and anxiety.   VITAL SIGNS: None   MULTI-SYSTEM PHYSICAL EXAMINATION:    Constitutional: Well-nourished. No physical deformities. Normally developed. Good grooming.     Complexity of Data:  Lab Test Review:   PSA  Records Review:   Pathology Reports, Previous Patient Records  X-Ray Review: PET- PSMA Scan: Reviewed Films.     02/27/22 08/26/21  PSA  Total PSA 6.35 ng/mL 5.30 ng/mL  Free PSA 0.80 ng/mL 0.67 ng/mL  % Free PSA 13 % PSA 13 % PSA    PROCEDURES: None   ASSESSMENT:      ICD-10 Details  1 GU:   Prostate Cancer - C61    PLAN:           Document Letter(s):  Created for Patient: Clinical Summary   Created for Patient: Clinical Summary         Notes:   1. Unfavorable intermediate risk prostate cancer: Dr. Kathrynn Running and myself met with Peter Roberts today and reviewed his options. Considering his negative flexible sigmoidoscopy, he would not appear to be at an increased risk for bleeding complications with radiation therapy. As such, he is interested in moving forward with IMRT. We discussed the pros and cons of androgen deprivation therapy and he did not wish to consider androgen deprivation therapy in the setting based on the side effect profile and is relatively lower volume disease. Dr. Kathrynn Running will plan to touch base with cardiology regarding the risk/benefit of having him  temporarily stop his anticoagulation for approximately 48 to 72 hours for fiducial marker/SpaceOAR. If there is significant risk associated with this, he would consider just treating him without markers or SpaceOAR. I will notify Dr. Benancio Deeds of his plan.   CC: Dr. Margaretmary Dys  Dr. Hardie Lora  Dr. Elias Else    E & M CODES: We spent 34 minutes dedicated to evaluation and management time, including face to face interaction, discussions on coordination of care, documentation, result review, and discussion with others as applicable.

## 2022-11-17 NOTE — Progress Notes (Signed)
                               Care Plan Summary  Name: Pio Stadtlander  DOB: Feb 02, 1951   Your Medical Team:   Urologist -  Dr. Heloise Purpura, [facility]  Radiation Oncologist - Dr. Margaretmary Dys, [facility]    Recommendations: 1) Radiation   2) Surgery    * These recommendations are based on information available as of today's consult.      Recommendations may change depending on the results of further tests or exams.    Next Steps: 1) Consider your options and contact Marisue Ivan with any questions, or treatment decisions.     When appointments need to be scheduled, you will be contacted by [facility] and/or [facility].  Questions?  Please do not hesitate to call Cherlyn Cushing, BSN, RN at [phone] with any questions or concerns.  Marisue Ivan is your Oncology Nurse Navigator and is available to assist you while you're receiving your medical care at [facility].

## 2022-11-17 NOTE — Progress Notes (Signed)
Radiation Oncology         [phone]) [phone] ________________________________  Multidisciplinary Prostate Cancer Clinic  Radiation Oncology Consultation  Name: Peter Roberts MRN: [id number]  Date: 11/17/2022  DOB: 19-Sep-1950  WU:[id number], Darci Current, PA-C  Belva Agee, MD   REFERRING PHYSICIAN: Belva Agee, MD  DIAGNOSIS: 72 y.o. gentleman with stage T1c adenocarcinoma of the prostate with a Gleason's score of 4+3 and a PSA of 6.35    ICD-10-CM   1. Prostate cancer (HCC)  C61       HISTORY OF PRESENT ILLNESS::Peter Roberts is a 72 y.o. gentleman with a diagnosis of prostate cancer. He was noted to have a fluctuating PSA beginning with 4.62 in November of 2019, 4.31 in March of 2020, 3.4 in June of 2021, and 4.29 in June of 2022. The patient had opted for surveillance at that time. His PSA increased from 5.30 on 08/27/2021 to 6.35 on 02/25/2022. Accordingly, the patient proceeded to transrectal ultrasound with 12 biopsies of the prostate under the care of Dr. Benancio Deeds on 04/24/22.  The prostate volume measured 32.87 cc.  Out of 12 core biopsies, 4 were positive.  The maximum Gleason score was 4+3, and this was seen in the right base and right base lateral. Additionally Gleason 3+4 was seen in the right mid and gleason 3+3 was seen in the right mid lateral. PSMA PET was performed for disease staging on 05/29/2022 and showed no evidence of metastatic disease.       We initially met the patient on 08/03/22 to discuss potential radiation treatment options. At that time, he opted to proceed with 5.5 weeks of daily external beam radiation therapy without ADT. He was scheduled for fiducial marker and SpaceOAR placement on 08/25/22, but in the interim, he was admitted to the hospital on 08/22/22 with proctitis and rectal bleeding following a 2-week period of constipation with no bowel movement.  A CT A/P on admission demonstrated marked rectal wall thickening with surrounding fat stranding, compatible  with proctitis.  He was seen by Dr. Benancio Deeds while in the hospital, and the recommendation was to complete a work up for proctitis before moving forward with any prostate cancer treatments.  He was originally scheduled for sigmoidoscopy on 10/19/2022 but went into atrial fibrillation with possible acute CVA that morning and was admitted to the hospital and started on anticoagulation.  He underwent flexible sigmoidoscopy on 10/21/22 under the care of Dr. Lorenso Quarry, and fortunately, final pathology showed only benign colorectal mucosa with mild reactive changes but no evidence of dysplasia, or malignancy.  The patient reviewed the biopsy and imaging results with his urologist and he has kindly been referred to the multidisciplinary prostate cancer clinic today for presentation of pathology and radiology studies in our conference for discussion of all potential treatment options and further clinical evaluation.  PREVIOUS RADIATION THERAPY: No  PAST MEDICAL HISTORY:  has a past medical history of Arthritis, BPH associated with nocturia, Carotid stenosis, asymptomatic, bilateral (2017), Elevated blood pressure reading without diagnosis of hypertension, Family history of prostate cancer, History of anal fissures, Hyperlipidemia, Hypertension, Malignant neoplasm prostate (HCC) (04/2022), and Microcytic anemia.    PAST SURGICAL HISTORY: Past Surgical History:  Procedure Laterality Date   BIOPSY  10/21/2022   Procedure: BIOPSY;  Surgeon: Lynann Bologna, DO;  Location: [facility] ENDOSCOPY;  Service: Gastroenterology;;   CARPAL TUNNEL RELEASE Left 12/2021   COLONOSCOPY  2019   ENDARTERECTOMY Left 02/25/2022   Procedure: LEFT CAROTID ENDARTERECTOMY;  Surgeon: Cephus Shelling,  MD;  Location: [facility] OR;  Service: Vascular;  Laterality: Left;   FLEXIBLE SIGMOIDOSCOPY N/A 10/21/2022   Procedure: FLEXIBLE SIGMOIDOSCOPY;  Surgeon: Lynann Bologna, DO;  Location: [facility] ENDOSCOPY;  Service: Gastroenterology;  Laterality: N/A;    KNEE ARTHROSCOPY Right 2002    FAMILY HISTORY: family history includes Breast cancer (age of onset: 72) in his sister; Cancer in his maternal grandfather; Cancer (age of onset: 20 - 89) in his mother; Heart attack in his father; Heart disease in his father; Lung disease in his mother; Prostate cancer (age of onset: 63 - 73) in his brother and brother.  SOCIAL HISTORY:  reports that he has never smoked. He has never been exposed to tobacco smoke. He has never used smokeless tobacco. He reports that he does not drink alcohol and does not use drugs.  ALLERGIES: Sulfa antibiotics  MEDICATIONS:  Current Outpatient Medications  Medication Sig Dispense Refill   apixaban (ELIQUIS) 5 MG TABS tablet Take 1 tablet (5 mg total) by mouth 2 (two) times daily. 60 tablet 3   Cholecalciferol (VITAMIN D3) 125 MCG (5000 UT) capsule Take 5,000 Units by mouth daily.     diclofenac Sodium (VOLTAREN) 1 % GEL Apply 4 g topically 4 (four) times daily as needed (pain).     Ferrous Sulfate (IRON) 90 (18 Fe) MG TABS Take 90 mg by mouth every evening.     Magnesium 500 MG CAPS Take 1 capsule by mouth every evening.     metoprolol succinate (TOPROL-XL) 25 MG 24 hr tablet Take 1 tablet (25 mg total) by mouth daily. 30 tablet 3   Oil of Oregano 1500 MG CAPS Take 1 capsule by mouth daily.     OVER THE COUNTER MEDICATION Take 3 capsules by mouth daily after lunch. Prostagenix     OVER THE COUNTER MEDICATION Take 1 tablet by mouth daily. Circ O2 (Patient not taking: Reported on 10/28/2022)     Probiotic Product (PROBIOTIC DAILY) CAPS Take 1 capsule by mouth daily.     rosuvastatin (CRESTOR) 20 MG tablet Take 1 tablet (20 mg total) by mouth every evening. (Patient taking differently: Take 20 mg by mouth every evening.) 90 tablet 2   No current facility-administered medications for this encounter.    REVIEW OF SYSTEMS:  On review of systems, the patient reports that he is doing well overall. He denies any chest pain, shortness  of breath, cough, fevers, chills, night sweats, unintended weight changes. He denies any bowel disturbances, and denies abdominal pain, nausea or vomiting. He denies any new musculoskeletal or joint aches or pains. His IPSS was 13, indicating moderate urinary symptoms. His SHIM was 16, indicating he has moderate erectile dysfunction. A complete review of systems is obtained and is otherwise negative.   PHYSICAL EXAM:  Wt Readings from Last 3 Encounters:  11/17/22 185 lb 12.8 oz (84.3 kg)  10/28/22 182 lb 12.8 oz (82.9 kg)  10/21/22 182 lb 15.7 oz (83 kg)   Temp Readings from Last 3 Encounters:  11/17/22 (!) 97.5 F (36.4 C) (Oral)  10/21/22 97.9 F (36.6 C) (Temporal)  08/25/22 97.6 F (36.4 C) (Oral)   BP Readings from Last 3 Encounters:  11/17/22 110/61  10/28/22 118/66  10/21/22 (!) 160/89   Pulse Readings from Last 3 Encounters:  11/17/22 63  10/28/22 67  10/21/22 65    /10  In general this is a well appearing Caucasian man in no acute distress. He's alert and oriented x4 and appropriate throughout the examination.  Cardiopulmonary assessment is negative for acute distress and he exhibits normal effort.    KPS = 100  100 - Normal; no complaints; no evidence of disease. 90   - Able to carry on normal activity; minor signs or symptoms of disease. 80   - Normal activity with effort; some signs or symptoms of disease. 10   - Cares for self; unable to carry on normal activity or to do active work. 60   - Requires occasional assistance, but is able to care for most of his personal needs. 50   - Requires considerable assistance and frequent medical care. 40   - Disabled; requires special care and assistance. 30   - Severely disabled; hospital admission is indicated although death not imminent. 20   - Very sick; hospital admission necessary; active supportive treatment necessary. 10   - Moribund; fatal processes progressing rapidly. 0     - Dead  Karnofsky DA, Abelmann WH,  Craver LS and Burchenal [facility] [phone]) The use of the nitrogen mustards in the palliative treatment of carcinoma: with particular reference to bronchogenic carcinoma Cancer 1 [id number]   LABORATORY DATA:  Lab Results  Component Value Date   WBC 7.2 10/21/2022   HGB 15.8 10/21/2022   HCT 49.7 10/21/2022   MCV 87.7 10/21/2022   PLT 219 10/21/2022   Lab Results  Component Value Date   NA 136 10/21/2022   K 4.0 10/21/2022   CL 105 10/21/2022   CO2 24 10/21/2022   Lab Results  Component Value Date   ALT 37 10/19/2022   AST 37 10/19/2022   ALKPHOS 78 10/19/2022   BILITOT 0.9 10/19/2022     RADIOGRAPHY: CT CORONARY MORPH W/CTA COR W/SCORE W/CA W/CM &/OR WO/CM  Addendum Date: 10/20/2022   ADDENDUM REPORT: 10/20/2022 14:58 EXAM: OVER-READ INTERPRETATION  CT CHEST The following report is an over-read performed by radiologist Dr. Duanne Guess of [facility] on 10/20/2022. This over-read does not include interpretation of cardiac or coronary anatomy or pathology. The coronary CTA interpretation by the cardiologist is attached. COMPARISON:  10/19/2022 FINDINGS: Normal heart size. No pericardial effusion. Image thoracic aorta is nonaneurysmal. Central pulmonary vasculature within normal limits. No adenopathy within the included chest. Imaged lung fields are clear. No acute bony or chest wall abnormality. 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 IMPRESSION: No acute or significant extracardiac findings. Electronically Signed   By: Duanne Guess D.O.   On: 10/20/2022 14:58   Result Date: 10/20/2022 CLINICAL DATA:  48M with chest pain EXAM: Cardiac/Coronary CTA TECHNIQUE: The patient was scanned on a [employer]. FINDINGS: A 100 kV prospective scan was triggered in the descending thoracic aorta at 111 HU's. Axial non-contrast 3 mm slices were carried out through the heart. The data set was analyzed on a dedicated work station and  scored using the Agatson method. Gantry rotation speed was 250 msecs and collimation was .6 mm. No beta blockade and 0.8 mg of sl NTG was given. The 3D data set was reconstructed in 5% intervals of the 35-75% of the R-R cycle. Phases were analyzed on a dedicated work station using MPR, MIP and VRT modes. The patient received 100 cc of contrast. Coronary Arteries:  Normal coronary origin.  Right dominance. RCA is a large dominant artery that gives rise to PDA and PLA. Mixed plaque in proximal RCA causes 0-24% stenosis. Calcified plaque in distal RCA causes 25-49% stenosis. Left main is a large artery that gives rise  to LAD and LCX arteries. LAD is a large vessel. Calcified plaque in proximal LAD causes 0-24% stenosis. Calcified plaque in mid LAD causes 25-49% stenosis. Calcified plaque in ostial D1 causes 50-69% stenosis. Mixed plaque in D2 causes 25-49% stenosis LCX is a non-dominant artery that gives rise to one large OM1 branch. Calcified plaque in proximal LCX causes 25-49% stenosis. Calcified plaque in OM1 causes 50-69% stenosis Other findings: Left Ventricle: Normal size Left Atrium: Mild enlargement Pulmonary Veins: Normal configuration Right Ventricle: Mild dilatation Right Atrium: Mild enlargement Cardiac valves: No calcifications Thoracic aorta: Normal size Pulmonary Arteries: Normal size Systemic Veins: Normal drainage Pericardium: Normal thickness IMPRESSION: 1. Coronary calcium score of 585. This was 78th percentile for age and sex matched control. 2. Total plaque volume 433mm3 which is 40th percentile for age and sex-matched controls (calcified plaque 129mm3; noncalcified plaque 379mm3). TPV is severe 2. Normal coronary origin with right dominance. 3. Calcified plaque in ostial D1 causes moderate (50-69%) stenosis 4. Calcified plaque in OM1 causes moderate (50-69%) stenosis 5. Mild (25-49%) stenosis in mid LAD, D2, proximal LCX, and distal RCA 6. Will send study for CTFFR CAD-RADS 3. Moderate stenosis.  Consider symptom-guided anti-ischemic pharmacotherapy as well as risk factor modification per guideline directed care. Additional analysis with CT FFR will be submitted. Electronically Signed: By: Epifanio Lesches M.D. On: 10/20/2022 14:42   CT CORONARY FFR DATA PREP & FLUID ANALYSIS  Result Date: 10/20/2022 EXAM: FFRCT ANALYSIS FINDINGS: FFRct analysis was performed on the original cardiac CT angiogram dataset. Diagrammatic representation of the FFRct analysis is provided in a separate PDF document in PACS. This dictation was created using the PDF document and an interactive 3D model of the results. 3D model is not available in the EMR/PACS. Normal FFR range is >0.80. 1. Left Main: No significant stenosis 2. LAD: CTFFR 0.83 across lesion in mid LAD, suggesting no significant stenosis 3. LCX: CTFFR 0.92 across lesion in proximal LCX suggesting no significant stenosis 4. RCA: CTFFR 0.91 across lesion in distal RCA suggesting no significant stenosis IMPRESSION: 1.  CTFFR suggests nonobstructive CAD 2. OM1 and D1 had moderate stenosis on CTA but were not modeled on CTFFR due to small vessel size Electronically Signed   By: Epifanio Lesches M.D.   On: 10/20/2022 14:54   ECHOCARDIOGRAM COMPLETE  Result Date: 10/20/2022    ECHOCARDIOGRAM REPORT   Patient Name:   Peter Roberts Date of Exam: 10/20/2022 Medical Rec #:  [id number]    Height:       67.0 in Accession #:    [id number]   Weight:       183.0 lb Date of Birth:  09/14/1950    BSA:          1.947 m Patient Age:    71 years     BP:           131/76 mmHg Patient Gender: M            HR:           58 bpm. Exam Location:  Inpatient Procedure: 2D Echo, Cardiac Doppler, Color Doppler and Intracardiac            Opacification Agent Indications:    Stroke.  History:        Patient has no prior history of Echocardiogram examinations.                 Abnormal ECG, Stroke, Arrythmias:Atrial Fibrillation;  Signs/Symptoms:Chest Pain.  Sonographer:     Sheralyn Boatman RDCS Referring Phys: [id number] [facility]  Sonographer Comments: Suboptimal apical window. Paused for stat lab draw IMPRESSIONS  1. Left ventricular ejection fraction, by estimation, is 60 to 65%. The left ventricle has normal function. The left ventricle has no regional wall motion abnormalities. Left ventricular diastolic parameters were normal.  2. Right ventricular systolic function is normal. The right ventricular size is normal.  3. The mitral valve is normal in structure. Trivial mitral valve regurgitation. No evidence of mitral stenosis.  4. The aortic valve was not well visualized. Aortic valve regurgitation is not visualized. No aortic stenosis is present.  5. The inferior vena cava is normal in size with greater than 50% respiratory variability, suggesting right atrial pressure of 3 mmHg. Comparison(s): No prior Echocardiogram. FINDINGS  Left Ventricle: Left ventricular ejection fraction, by estimation, is 60 to 65%. The left ventricle has normal function. The left ventricle has no regional wall motion abnormalities. Definity contrast agent was given IV to delineate the left ventricular  endocardial borders. The left ventricular internal cavity size was normal in size. There is no left ventricular hypertrophy. Left ventricular diastolic parameters were normal. Right Ventricle: The right ventricular size is normal. No increase in right ventricular wall thickness. Right ventricular systolic function is normal. Left Atrium: Left atrial size was normal in size. Right Atrium: Right atrial size was normal in size. Pericardium: There is no evidence of pericardial effusion. Mitral Valve: The mitral valve is normal in structure. Trivial mitral valve regurgitation. No evidence of mitral valve stenosis. Tricuspid Valve: The tricuspid valve is normal in structure. Tricuspid valve regurgitation is trivial. No evidence of tricuspid stenosis. Aortic Valve: The aortic valve was not well visualized. Aortic valve  regurgitation is not visualized. No aortic stenosis is present. Pulmonic Valve: The pulmonic valve was normal in structure. Pulmonic valve regurgitation is not visualized. No evidence of pulmonic stenosis. Aorta: The aortic root and ascending aorta are structurally normal, with no evidence of dilitation. Venous: The inferior vena cava is normal in size with greater than 50% respiratory variability, suggesting right atrial pressure of 3 mmHg. IAS/Shunts: No atrial level shunt detected by color flow Doppler.  LEFT VENTRICLE PLAX 2D LVIDd:         4.30 cm   Diastology LVIDs:         2.80 cm   LV e' medial:    8.27 cm/s LV PW:         0.90 cm   LV E/e' medial:  9.4 LV IVS:        1.00 cm   LV e' lateral:   10.40 cm/s LVOT diam:     2.40 cm   LV E/e' lateral: 7.5 LV SV:         88 LV SV Index:   45 LVOT Area:     4.52 cm  RIGHT VENTRICLE             IVC RV S prime:     14.60 cm/s  IVC diam: 1.60 cm TAPSE (M-mode): 2.2 cm LEFT ATRIUM             Index        RIGHT ATRIUM           Index LA diam:        3.90 cm 2.00 cm/m   RA Area:     15.80 cm LA Vol (A2C):   42.6 ml 21.88 ml/m  RA Volume:  34.60 ml  17.77 ml/m LA Vol (A4C):   27.0 ml 13.87 ml/m LA Biplane Vol: 33.7 ml 17.31 ml/m  AORTIC VALVE LVOT Vmax:   90.70 cm/s LVOT Vmean:  56.600 cm/s LVOT VTI:    0.195 m  AORTA Ao Root diam: 2.90 cm Ao Asc diam:  3.10 cm MITRAL VALVE MV Area (PHT): 3.42 cm    SHUNTS MV Decel Time: 222 msec    Systemic VTI:  0.20 m MV E velocity: 78.10 cm/s  Systemic Diam: 2.40 cm MV A velocity: 81.20 cm/s MV E/A ratio:  0.96 Riley Lam MD Electronically signed by Riley Lam MD Signature Date/Time: 10/20/2022/1:21:47 PM    Final    MR BRAIN WO CONTRAST  Result Date: 10/19/2022 CLINICAL DATA:  Transient ischemic attack. Neuro deficit, acute, stroke suspected. EXAM: MRI HEAD WITHOUT CONTRAST TECHNIQUE: Multiplanar, multiecho pulse sequences of the brain and surrounding structures were obtained without intravenous  contrast. COMPARISON:  CTA head/neck 10/19/2022. FINDINGS: Brain: No acute infarct or hemorrhage. Moderate chronic small-vessel disease. No hydrocephalus or extra-axial collection. No foci of abnormal susceptibility. No mass or midline shift. Vascular: Normal flow voids. Skull and upper cervical spine: Normal marrow signal. Sinuses/Orbits: Unremarkable. Other: None. IMPRESSION: 1. No acute intracranial process. 2. Moderate chronic small-vessel disease. Electronically Signed   By: Orvan Falconer M.D.   On: 10/19/2022 21:22   CT Angio Chest PE W and/or Wo Contrast  Result Date: 10/19/2022 CLINICAL DATA:  History of prostate cancer. EXAM: CT ANGIOGRAPHY CHEST WITH CONTRAST TECHNIQUE: Multidetector CT imaging of the chest was performed using the standard protocol during bolus administration of intravenous contrast. Multiplanar CT image reconstructions and MIPs were obtained to evaluate the vascular anatomy. RADIATION DOSE REDUCTION: This exam was performed according to the departmental dose-optimization program which includes automated exposure control, adjustment of the mA and/or kV according to patient size and/or use of iterative reconstruction technique. CONTRAST:  75mL OMNIPAQUE IOHEXOL 350 MG/ML SOLN COMPARISON:  None Available. FINDINGS: Cardiovascular: Significant breathing motion seen throughout the examination. This limits evaluation for pulmonary emboli. Nondiagnostic for small and peripheral emboli. No large or central embolus. Normal caliber thoracic aorta with mild calcified plaque. Coronary artery calcifications are seen. The heart is nonenlarged. No pericardial effusion. Mediastinum/Nodes: Slightly patulous thoracic esophagus. No specific abnormal lymph node enlargement seen in the axillary region, hilum or mediastinum. There are some small lymph nodes identified in the right lung hilum which are less than a cm in short axis and not pathologic by size criteria. Lungs/Pleura: Linear opacity lung  bases likely scar or atelectasis. No consolidation, pneumothorax or effusion. Upper Abdomen: Along the upper abdomen the adrenal glands are preserved. There is some high density noted within the left kidney. Please correlate with prior CT angiogram of the head from same day Musculoskeletal: Diffuse degenerative changes of the spine. There is multilevel Schmorl's node changes with bridging osteophytes and syndesmophytes. Mild compression noted along the lower thoracic spine vertebral levels. Review of the MIP images confirms the above findings. IMPRESSION: Extensive breathing motion.  No large or central embolus. Aortic Atherosclerosis (ICD10-I70.0). Electronically Signed   By: Karen Kays M.D.   On: 10/19/2022 17:25   CT HEAD CODE STROKE WO CONTRAST  Result Date: 10/19/2022 CLINICAL DATA:  Code stroke. New onset dizziness and intermittent aphasia, resolved. EXAM: CT ANGIOGRAPHY HEAD AND NECK TECHNIQUE: Multidetector CT imaging of the head and neck was performed using the standard protocol during bolus administration of intravenous contrast. Multiplanar CT image reconstructions and MIPs were obtained  to evaluate the vascular anatomy. Carotid stenosis measurements (when applicable) are obtained utilizing NASCET criteria, using the distal internal carotid diameter as the denominator. RADIATION DOSE REDUCTION: This exam was performed according to the departmental dose-optimization program which includes automated exposure control, adjustment of the mA and/or kV according to patient size and/or use of iterative reconstruction technique. CONTRAST:  75mL OMNIPAQUE IOHEXOL 350 MG/ML SOLN COMPARISON:  None Available. FINDINGS: CT HEAD FINDINGS Brain: There is no evidence of acute intracranial hemorrhage or extra-axial fluid collection. There is no evolved large vessel territorial infarct. There is hypodensity in the left corona radiata best seen in the coronal sequence which could reflect an acute infarct in the left  MCA distribution (4-32). ASPECTS is 9 Background parenchymal volume is normal. The ventricles are normal in size. Additional hypodensity in the supratentorial white matter is nonspecific but likely reflects sequela of underlying chronic small-vessel ischemic change. The pituitary and suprasellar region are normal. There is no mass lesion. There is no mass effect or midline shift. Vascular: There is calcification of the bilateral carotid siphons. Skull: Normal. Negative for fracture or focal lesion. Sinuses/Orbits: The paranasal sinuses are clear. The globes and orbits are unremarkable. Other: None. Review of the MIP images confirms the above findings CTA NECK FINDINGS Aortic arch: The imaged aortic arch is normal. The origins of the major branch vessels are patent. The subclavian arteries are patent to the level imaged. Right carotid system: The right common carotid artery is patent. There is mixed plaque of the bifurcation resulting in approximately 30-40% stenosis. The distal internal carotid artery is patent. The external carotid artery is patent. There is no evidence of dissection or aneurysm. Left carotid system: The left common carotid artery is patent. There is mild atherosclerotic irregularity at the bifurcation without significant stenosis or occlusion. The distal internal carotid artery is patent. The external carotid artery is patent. There is no evidence of dissection or aneurysm. Vertebral arteries: Plaque of the right vertebral artery origin resulting in moderate stenosis. The vertebral arteries are otherwise patent, without hemodynamically significant stenosis or occlusion. There is no evidence of dissection or aneurysm. Skeleton: There is no acute osseous abnormality or suspicious osseous lesion. Other neck: The soft tissues of the neck are unremarkable. Upper chest: The imaged lung apices are clear. Review of the MIP images confirms the above findings CTA HEAD FINDINGS Anterior circulation: There is  calcified plaque in the intracranial ICAs without greater than mild stenosis. The bilateral MCAs are patent, without proximal stenosis or occlusion. The bilateral ACAs are patent, without proximal stenosis or occlusion. The anterior communicating artery is normal. There is no aneurysm or AVM. Posterior circulation: The bilateral V4 segments are patent. The basilar artery is patent. The major cerebellar arteries appear patent. The bilateral PCAs are patent, without proximal stenosis or occlusion. A small left posterior communicating artery is identified. There is no aneurysm or AVM. Venous sinuses: As permitted by contrast timing, patent. Anatomic variants: None. Review of the MIP images confirms the above findings IMPRESSION: 1. Possible acute to subacute infarct in the left corona radiata. Consider MRI for further evaluation. 2. No emergent vascular finding. 3. 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 Mild atherosclerotic irregularity in the left without significant stenosis. 4. 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 5. Patent intracranial vasculature. Findings communicated to Dr. Wilford Corner at 1:15 pm. Electronically Signed   By: Lesia Hausen M.D.   On: 10/19/2022 13:21   CT ANGIO HEAD  NECK W WO CM (CODE STROKE)  Result Date: 10/19/2022 CLINICAL DATA:  Code stroke. New onset dizziness and intermittent aphasia, resolved. EXAM: CT ANGIOGRAPHY HEAD AND NECK TECHNIQUE: Multidetector CT imaging of the head and neck was performed using the standard protocol during bolus administration of intravenous contrast. Multiplanar CT image reconstructions and MIPs were obtained to evaluate the vascular anatomy. Carotid stenosis measurements (when applicable) are obtained utilizing NASCET criteria, using the distal internal carotid diameter as the denominator. RADIATION DOSE REDUCTION: This exam was performed according to the departmental dose-optimization  program which includes automated exposure control, adjustment of the mA and/or kV according to patient size and/or use of iterative reconstruction technique. CONTRAST:  75mL OMNIPAQUE IOHEXOL 350 MG/ML SOLN COMPARISON:  None Available. FINDINGS: CT HEAD FINDINGS Brain: There is no evidence of acute intracranial hemorrhage or extra-axial fluid collection. There is no evolved large vessel territorial infarct. There is hypodensity in the left corona radiata best seen in the coronal sequence which could reflect an acute infarct in the left MCA distribution (4-32). ASPECTS is 9 Background parenchymal volume is normal. The ventricles are normal in size. Additional hypodensity in the supratentorial white matter is nonspecific but likely reflects sequela of underlying chronic small-vessel ischemic change. The pituitary and suprasellar region are normal. There is no mass lesion. There is no mass effect or midline shift. Vascular: There is calcification of the bilateral carotid siphons. Skull: Normal. Negative for fracture or focal lesion. Sinuses/Orbits: The paranasal sinuses are clear. The globes and orbits are unremarkable. Other: None. Review of the MIP images confirms the above findings CTA NECK FINDINGS Aortic arch: The imaged aortic arch is normal. The origins of the major branch vessels are patent. The subclavian arteries are patent to the level imaged. Right carotid system: The right common carotid artery is patent. There is mixed plaque of the bifurcation resulting in approximately 30-40% stenosis. The distal internal carotid artery is patent. The external carotid artery is patent. There is no evidence of dissection or aneurysm. Left carotid system: The left common carotid artery is patent. There is mild atherosclerotic irregularity at the bifurcation without significant stenosis or occlusion. The distal internal carotid artery is patent. The external carotid artery is patent. There is no evidence of dissection or  aneurysm. Vertebral arteries: Plaque of the right vertebral artery origin resulting in moderate stenosis. The vertebral arteries are otherwise patent, without hemodynamically significant stenosis or occlusion. There is no evidence of dissection or aneurysm. Skeleton: There is no acute osseous abnormality or suspicious osseous lesion. Other neck: The soft tissues of the neck are unremarkable. Upper chest: The imaged lung apices are clear. Review of the MIP images confirms the above findings CTA HEAD FINDINGS Anterior circulation: There is calcified plaque in the intracranial ICAs without greater than mild stenosis. The bilateral MCAs are patent, without proximal stenosis or occlusion. The bilateral ACAs are patent, without proximal stenosis or occlusion. The anterior communicating artery is normal. There is no aneurysm or AVM. Posterior circulation: The bilateral V4 segments are patent. The basilar artery is patent. The major cerebellar arteries appear patent. The bilateral PCAs are patent, without proximal stenosis or occlusion. A small left posterior communicating artery is identified. There is no aneurysm or AVM. Venous sinuses: As permitted by contrast timing, patent. Anatomic variants: None. Review of the MIP images confirms the above findings IMPRESSION: 1. Possible acute to subacute infarct in the left corona radiata. Consider MRI for further evaluation. 2. No emergent vascular finding. 3. Calcified plaque at the  right carotid bifurcation resulting in approximately 30-40% stenosis. Mild atherosclerotic irregularity in the left without significant stenosis. 4. 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 5. Patent intracranial vasculature. Findings communicated to Dr. Wilford Corner at 1:15 pm. Electronically Signed   By: Lesia Hausen M.D.   On: 10/19/2022 13:21      IMPRESSION/PLAN: 72 y.o. 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    We  discussed the patient's workup and outlined the nature of prostate cancer in this setting. The patient's T stage, Gleason's score, and PSA put him into the unfavorable intermediate risk group. Accordingly, he is eligible for a variety of potential treatment options including brachytherapy, 5.5 weeks of external radiation, or prostatectomy. We discussed the available radiation techniques, and focused on the details and logistics of delivery. We discussed and outlined the risks, benefits, short and long-term effects associated with radiotherapy and compared and contrasted these with prostatectomy. He appears to have a good understanding of his disease and our treatment recommendations which are of curative intent.  He was encouraged to ask questions that were answered to his stated satisfaction.  At the end of the conversation the patient remains most interested in moving forward with 5.5 weeks of external beam therapy without ADT so we will share our discussion with Dr. Benancio Deeds and proceed with treatment planning accordingly. We discussed the risks and benefits of fiducial/SpaceOAR placement now that the patient is on anticoagulation which would have to be held for at least 72 hours prior to the procedure.  We can certainly proceed with the daily external beam radiation treatment without the fiducial markers and SpaceOAR gel placement if it is felt that holding his anticoagulation would put him at high risk for adverse events.  He has a follow-up visit scheduled with Tereso Newcomer, PA-C in cardiology on 11/23/2022 so we will seek his recommendation at that time as to whether it is safe to proceed with temporarily holding the anticoagulation for the procedure or whether he feels that it is too risky.  The patient appears to have a good understanding of his disease and our treatment recommendations which are of curative intent and is in agreement with the stated plan.  Therefore, we will await recommendations from  cardiology regarding holding anticoagulation for the fiducial marker/SpaceOAR gel placement and then move forward with treatment planning accordingly, in anticipation of beginning IMRT in the near future.  We enjoyed meeting with him again today and look forward to continuing to participate in his care.   We personally spent 60 minutes in this encounter including chart review, reviewing radiological studies, meeting face-to-face with the patient, entering orders and completing documentation.    Marguarite Arbour, PA-C    Margaretmary Dys, MD  [facility]  Radiation Oncology Direct Dial: [phone]  Fax: [phone] [email or web address]  Skype  LinkedIn   This document serves as a record of services personally performed by Margaretmary Dys, MD and Marcello Fennel, PA-C. It was created on their behalf by Mickie Bail, a trained medical scribe. The creation of this record is based on the scribe's personal observations and the provider's statements to them. This document has been checked and approved by the attending provider.

## 2022-11-17 NOTE — Progress Notes (Unsigned)
REFERRING PROVIDER: Margaretmary Dys, MD  PRIMARY PROVIDER:  Alyson Ingles, PA-C  PRIMARY REASON FOR VISIT:  1. Prostate cancer (HCC)   2. Family history of prostate cancer   3. Family history of breast cancer     HISTORY OF PRESENT ILLNESS:   Peter Roberts, a 72 y.o. male, was seen for a [place] cancer genetics consultation at the request of Dr. Kathrynn Running due to a personal and family history of cancer.  Peter Roberts presents to clinic today to discuss the possibility of a hereditary predisposition to cancer, to discuss genetic testing, and to further clarify his future cancer risks, as well as potential cancer risks for family members.   Peter Roberts was diagnosed with prostate cancer at age 18, Gleason score 7.   CANCER HISTORY:  Oncology History  Prostate cancer (HCC)  04/24/2022 Cancer Staging   Staging form: Prostate, AJCC 8th Edition - Clinical stage from 04/24/2022: Stage IIC (cT1c, cN0, cM0, PSA: 6.4, Grade Group: 3) - Signed by Marcello Fennel, PA-C on 08/06/2022 Histopathologic type: Adenocarcinoma, NOS Stage prefix: Initial diagnosis Prostate specific antigen (PSA) range: Less than 10 Gleason primary pattern: 4 Gleason secondary pattern: 3 Gleason score: 7 Histologic grading system: 5 grade system Number of biopsy cores examined: 12 Number of biopsy cores positive: 4 Location of positive needle core biopsies: One side   08/03/2022 Initial Diagnosis   Malignant neoplasm of prostate [facility])     Past Medical History:  Diagnosis Date   Arthritis    BPH associated with nocturia    Carotid stenosis, asymptomatic, bilateral 2017   followed by pcp and vascular dr Chestine Spore;    s/p left CEA 02-25-2022 for stenosis >80%; (post op note in epic 03-17-2022 healing well)   last duplex in epic 02-03-2022 right ICA 40-59% and left ICA 80-99%   Elevated blood pressure reading without diagnosis of hypertension    Family history of prostate cancer    History of anal fissures     Hyperlipidemia    Hypertension    Malignant neoplasm prostate [facility]) 04/2022   urologist--- dr newsome/  radiation oncologist--- dr Kathrynn Running;  dx 11/ 2023,  Gleason 4+3   Microcytic anemia     Past Surgical History:  Procedure Laterality Date   BIOPSY  10/21/2022   Procedure: BIOPSY;  Surgeon: Lynann Bologna, DO;  Location: [facility] ENDOSCOPY;  Service: Gastroenterology;;   CARPAL TUNNEL RELEASE Left 12/2021   COLONOSCOPY  2019   ENDARTERECTOMY Left 02/25/2022   Procedure: LEFT CAROTID ENDARTERECTOMY;  Surgeon: Cephus Shelling, MD;  Location: [facility] OR;  Service: Vascular;  Laterality: Left;   FLEXIBLE SIGMOIDOSCOPY N/A 10/21/2022   Procedure: FLEXIBLE SIGMOIDOSCOPY;  Surgeon: Lynann Bologna, DO;  Location: [facility] ENDOSCOPY;  Service: Gastroenterology;  Laterality: N/A;   KNEE ARTHROSCOPY Right 2002    Social History   Socioeconomic History   Marital status: Widowed    Spouse name: Not on file   Number of children: 3   Years of education: Not on file   Highest education level: Not on file  Occupational History   Occupation: Retired  Tobacco Use   Smoking status: Never    Passive exposure: Never   Smokeless tobacco: Never  Vaping Use   Vaping Use: Never used  Substance and Sexual Activity   Alcohol use: No   Drug use: Never   Sexual activity: Not Currently  Other Topics Concern   Not on file  Social History Narrative   Not on file  Social Determinants of Health   Financial Resource Strain: Not on file  Food Insecurity: No Food Insecurity (10/19/2022)   Hunger Vital Sign    Worried About Running Out of Food in the Last Year: Never true    Ran Out of Food in the Last Year: Never true  Transportation Needs: No Transportation Needs (10/19/2022)   PRAPARE - Administrator, Civil Service (Medical): No    Lack of Transportation (Non-Medical): No  Physical Activity: Not on file  Stress: Not on file  Social Connections: Not on file     FAMILY HISTORY:  We  obtained a detailed, 4-generation family history.  Significant diagnoses are listed below: Family History  Problem Relation Age of Onset   Lung disease Mother    Cancer Mother 58 - 8       gyn cancer   Heart attack Father    Heart disease Father    Breast cancer Sister 54   Prostate cancer Brother 24 - 47       negative genetic testing   Prostate cancer Brother 24 - 58   Cancer Maternal Grandfather        mouth or tongue cancer     Peter Roberts has 3 brothers, 2 were diagnosed with prostate cancer in their mid 59s, one brother had negative genetic testing. He has 3 sisters, 1 was diagnosed with breast cancer at age 61. His mother was diagnosed with a gynecological cancer in her 59s, she died at age 81. His maternal grandfather was diagnosed with cancer of the mouth, he died in his 7s. There is no reported Ashkenazi Jewish ancestry.   GENETIC COUNSELING ASSESSMENT: Peter Roberts is a 72 y.o. male with a personal and family history of cancer which is somewhat suggestive of a hereditary predisposition to cancer. We, therefore, discussed and recommended the following at today's visit.   DISCUSSION: We discussed that 5 - 10% of cancer is hereditary, with most cases of prostate cancer associated with BRCA1/2.  There are other genes that can be associated with hereditary prostate cancer syndromes.  We discussed that testing is beneficial for several reasons including knowing how to follow individuals after completing their treatment, identifying whether potential treatment options would be beneficial, and understanding if other family members could be at risk for cancer and allowing them to undergo genetic testing.   We reviewed the characteristics, features and inheritance patterns of hereditary cancer syndromes. We also discussed genetic testing, including the appropriate family members to test, the process of testing, insurance coverage and turn-around-time for results. We discussed the implications of  a negative, positive, carrier and/or variant of uncertain significant result.   Peter Roberts elected to have [facility] Custom Panel. The Custom Hereditary Cancers Panel offered by [facility] includes sequencing and/or deletion duplication testing of the following 43 genes: APC, ATM, AXIN2, BAP1, BARD1, BMPR1A, BRCA1, BRCA2, BRIP1, CDH1, CDK4, CDKN2A (p14ARF and p16INK4a only), CHEK2, CTNNA1, EPCAM (Deletion/duplication testing only), FH, GREM1 (promoter region duplication testing only), HOXB13, KIT, MBD4, MEN1, MLH1, MSH2, MSH3, MSH6, MUTYH, NF1, NHTL1, PALB2, PDGFRA, PMS2, POLD1, POLE, PTEN, RAD51C, RAD51D, SMAD4, SMARCA4. STK11, TP53, TSC1, TSC2, and VHL.  Based on Peter Roberts personal and family history of cancer, he meets medical criteria for genetic testing. Despite that he meets criteria, he may still have an out of pocket cost. We discussed that if his out of pocket cost for testing is over $100, the laboratory will call and confirm whether he wants to proceed with  testing.  If the out of pocket cost of testing is less than $100 he will be billed by the genetic testing laboratory.   PLAN: After considering the risks, benefits, and limitations, Peter Roberts provided informed consent to pursue genetic testing and the blood sample was sent to [facility] for analysis of the Custom Panel. Results should be available within approximately 2-3 weeks' time, at which point they will be disclosed by telephone to Peter Roberts, as will any additional recommendations warranted by these results. Peter Roberts will receive a summary of his genetic counseling visit and a copy of his results once available. This information will also be available in Epic.   Peter Roberts questions were answered to his satisfaction today. Our contact information was provided should additional questions or concerns arise. Thank you for the referral and allowing [place] to share in the care of your patient.   Lalla Brothers, MS, [facility] Genetic  Counselor [email or web address] (P) [phone]  The patient was seen for a total of 20 minutes in face-to-face genetic counseling.  The patient was seen alone.  Drs. Pamelia Hoit and/or Mosetta Putt were available to discuss this case as needed.   _______________________________________________________________________ For Office Staff:  Number of people involved in session: 1 Was an Intern/ student involved with case: no

## 2022-11-22 DIAGNOSIS — I2510 Atherosclerotic heart disease of native coronary artery without angina pectoris: Secondary | ICD-10-CM | POA: Insufficient documentation

## 2022-11-22 NOTE — Progress Notes (Unsigned)
Cardiology Office Note:    Date:  11/23/2022  ID:  [name], DOB February 05, 1951, MRN [id number] PCP: Tamala Ser  [facility] Providers Cardiologist:  Armanda Magic, MD Cardiology APP:  Beatrice Lecher, PA-C       Patient Profile:      Paroxysmal atrial fibrillation  TTE 10/20/2022: EF 60-65, no RWMA, normal RVSF, trivial MR, RAP 3 Coronary artery disease  CCTA 10/20/2022: CAC score 585 (78th percentile), plaque volume 436 mm (40th percentile), total plaque volume is severe, ostial D1 50-69, OM1 50-69, mid LAD, D2, proximal LCx, distal RCA 25-49; D1 and OM1 not modeled on CTFFR 2/2 small vessel size; FFR normal >> nonobstructive CAD Carotid artery disease  S/p L CEA 02/2022 Head and neck CTA 09/2022: R carotid bifurcation 30-40 Hx of TIA 09/2022 Hypertension  Hyperlipidemia  Prostate CA  Anemia        History of Present Illness:   Peter Roberts is a 72 y.o. male who returns for post hospital f/u. He was admitted 4/29-5/1 to [facility] with chest pain and speech difficulty concerning for stroke like symptoms. He was noted to be in AFib  w RVR. He converted to NSR on Diltiazem. His hsTrops were mildly elevated w/o trend. TTE showed normal EF. CCTA w FFR demonstrated nonobstructive CAD. His elevated hsTrops were felt to be due to demand ischemia. He was being worked up for rectal bleeding prior to admission. He had a colonoscopy with GI this admission and was cleared to start anticoagulation. Head and neck CTA on admission was concerning for acute to subacute L corona radiata CVA. MRI was normal. Neurology notes indicated the pt likely had a L brain TIA in the setting of newly dx atrial fibrillation. Of note, he has been seen by radiation oncology for his prostate CA. I have been contacted by PA with Dr. Kathrynn Running to see if his anticoagulation can be held for 72 hours to place fiducial markers and SpaceOAR prior to starting radiation tx.   He is here alone. He is doing  well w/o recurrent chest pain or palpitations. He has not had shortness of breath, syncope, orthopnea, leg edema.   Review of Systems  Musculoskeletal:  Positive for arthritis.  Gastrointestinal:  Negative for hematochezia and melena.  Genitourinary:  Negative for hematuria.    See HPI    Studies Reviewed:    EKG:  not done EKG from the AFib clinic on 10/28/22 was reviewed - NSR, HR 67, normal axis, non-specific ST-TW changes   Risk Assessment/Calculations:    CHA2DS2-VASc Score = 5   This indicates a 7.2% annual risk of stroke. The patient's score is based upon: CHF History: 0 HTN History: 1 Diabetes History: 0 Stroke History: 2 Vascular Disease History: 1 Age Score: 1 Gender Score: 0            Physical Exam:   VS:  BP 130/76   Pulse 60   Ht 5\' 7"  (1.702 m)   Wt 184 lb 12.8 oz (83.8 kg)   SpO2 100%   BMI 28.94 kg/m    Wt Readings from Last 3 Encounters:  11/23/22 184 lb 12.8 oz (83.8 kg)  11/17/22 185 lb 12.8 oz (84.3 kg)  10/28/22 182 lb 12.8 oz (82.9 kg)    Constitutional:      Appearance: Healthy appearance. Not in distress.  Neck:     Vascular: JVD normal.  Pulmonary:     Breath sounds: Normal breath sounds. No wheezing.  No rales.  Cardiovascular:     Normal rate. Regular rhythm.     Murmurs: There is no murmur.  Edema:    Peripheral edema absent.  Abdominal:     Palpations: Abdomen is soft.       ASSESSMENT AND PLAN:   Paroxysmal atrial fibrillation (HCC) Status post recent admission for atrial fibrillation with rapid ventricular rate (new diagnosis).  He converted to sinus rhythm with IV diltiazem.  He had follow-up EKG in the atrial fibrillation clinic May 8 and remained in normal sinus rhythm at that time.  He remains in sinus rhythm by exam today.  He has not had any recurrent symptoms.  Stroke risk is significant based upon CHA2DS2-VASc score.  Continue Eliquis 5 mg twice daily, Toprol-XL 25 mg daily.  He has follow-up with the atrial  fibrillation clinic again in July.  Follow-up here in 6 months.  CAD (coronary artery disease) He had elevated enzymes during his most recent admission that were flat and more consistent with demand ischemia.  Coronary CTA demonstrated nonobstructive CAD.  He is not having chest discomfort to suggest angina.  He does not require aspirin as he is on Eliquis.  Continue Crestor 20 mg daily.  Asymptomatic carotid artery stenosis without infarction, left Head neck CTA in the hospital in April 2024 demonstrated 30-40% right carotid artery bifurcation stenosis and a patent left carotid artery.  Continue follow-up with vascular surgery as planned.  Primary hypertension Blood pressure is well-controlled.  Continue Toprol-XL 25 mg daily.  Pure hypercholesterolemia LDL during his most recent hospitalization was at goal at 59 on his current dose of statin therapy.  Continue Crestor 20 mg daily.  Prostate cancer [facility]) As noted, he needs to start radiation therapy.  It has been requested that he hold Eliquis for 72 hours prior to placement of fiducial markers.  I reviewed his case today with Dr. Excell Seltzer (attending MD).  He is 30 days out from conversion to normal sinus rhythm in the hospital.  His MRI also did not demonstrate findings consistent with stroke.  Strokes related to atrial fibrillation should cause findings consistent with infarct in multiple regions.  Therefore, since his MRI was negative, he should be low risk to interrupt anticoagulation for his procedure.  He can hold his Eliquis for 72 hours prior to his procedure and resume as soon as possible postop, when felt to be safe from a bleeding perspective.  Degenerative joint disease He notes significant pain issues since having to stop NSAIDs. I recommended that he try Tylenol first. If this is not adequate, he can use NSAIDs on occasion. However, he should avoid regular use.      Dispo:  Return in about 6 months (around 05/25/2023) for Routine  Follow Up w/ Dr. Mayford Knife.  Signed, Tereso Newcomer, PA-C

## 2022-11-23 ENCOUNTER — Encounter: Payer: Self-pay | Admitting: Physician Assistant

## 2022-11-23 ENCOUNTER — Ambulatory Visit: Payer: Medicare Other | Attending: Physician Assistant | Admitting: Physician Assistant

## 2022-11-23 ENCOUNTER — Telehealth: Payer: Self-pay | Admitting: *Deleted

## 2022-11-23 VITALS — BP 130/76 | HR 60 | Ht 67.0 in | Wt 184.8 lb

## 2022-11-23 DIAGNOSIS — I480 Paroxysmal atrial fibrillation: Secondary | ICD-10-CM | POA: Diagnosis not present

## 2022-11-23 DIAGNOSIS — I2510 Atherosclerotic heart disease of native coronary artery without angina pectoris: Secondary | ICD-10-CM | POA: Diagnosis not present

## 2022-11-23 DIAGNOSIS — I10 Essential (primary) hypertension: Secondary | ICD-10-CM | POA: Diagnosis not present

## 2022-11-23 DIAGNOSIS — E7800 Pure hypercholesterolemia, unspecified: Secondary | ICD-10-CM

## 2022-11-23 DIAGNOSIS — M1990 Unspecified osteoarthritis, unspecified site: Secondary | ICD-10-CM

## 2022-11-23 DIAGNOSIS — I6522 Occlusion and stenosis of left carotid artery: Secondary | ICD-10-CM | POA: Diagnosis not present

## 2022-11-23 DIAGNOSIS — C61 Malignant neoplasm of prostate: Secondary | ICD-10-CM

## 2022-11-23 NOTE — Assessment & Plan Note (Signed)
Status post recent admission for atrial fibrillation with rapid ventricular rate (new diagnosis).  He converted to sinus rhythm with IV diltiazem.  He had follow-up EKG in the atrial fibrillation clinic May 8 and remained in normal sinus rhythm at that time.  He remains in sinus rhythm by exam today.  He has not had any recurrent symptoms.  Stroke risk is significant based upon CHA2DS2-VASc score.  Continue Eliquis 5 mg twice daily, Toprol-XL 25 mg daily.  He has follow-up with the atrial fibrillation clinic again in July.  Follow-up here in 6 months.

## 2022-11-23 NOTE — Assessment & Plan Note (Signed)
Blood pressure is well controlled.  Continue Toprol-XL 25 mg daily. 

## 2022-11-23 NOTE — Patient Instructions (Signed)
Medication Instructions:   Your physician recommends that you continue on your current medications as directed. Please refer to the Current Medication list given to you today.   *If you need a refill on your cardiac medications before your next appointment, please call your pharmacy*   Lab Work:    None Ordered today     If you have labs (blood work) drawn today and your tests are completely normal, you will receive your results only by: MyChart Message (if you have MyChart) OR A paper copy in the mail If you have any lab test that is abnormal or we need to change your treatment, we will call you to review the results.   Testing/Procedures:    None Ordered today       Follow-Up: At [facility], you and your health needs are our priority.  As part of our continuing mission to provide you with exceptional heart care, we have created designated Provider Care Teams.  These Care Teams include your primary Cardiologist (physician) and Advanced Practice Providers (APPs -  Physician Assistants and Nurse Practitioners) who all work together to provide you with the care you need, when you need it.  We recommend signing up for the patient portal called "MyChart".  Sign up information is provided on this After Visit Summary.  MyChart is used to connect with patients for Virtual Visits (Telemedicine).  Patients are able to view lab/test results, encounter notes, upcoming appointments, etc.  Non-urgent messages can be sent to your provider as well.   To learn more about what you can do with MyChart, go to [email or web address].    Your next appointment:    6 month(s)   Provider:  Dr. Mayford Knife    Other Instructions

## 2022-11-23 NOTE — Telephone Encounter (Signed)
Lvm to let patient know that a Eliquis Copay $10 for a year and will be available for pick up up front is he would like to come get

## 2022-11-23 NOTE — Assessment & Plan Note (Signed)
As noted, he needs to start radiation therapy.  It has been requested that he hold Eliquis for 72 hours prior to placement of fiducial markers.  I reviewed his case today with Dr. Excell Seltzer (attending MD).  He is 30 days out from conversion to normal sinus rhythm in the hospital.  His MRI also did not demonstrate findings consistent with stroke.  Strokes related to atrial fibrillation should cause findings consistent with infarct in multiple regions.  Therefore, since his MRI was negative, he should be low risk to interrupt anticoagulation for his procedure.  He can hold his Eliquis for 72 hours prior to his procedure and resume as soon as possible postop, when felt to be safe from a bleeding perspective.

## 2022-11-23 NOTE — Assessment & Plan Note (Signed)
He notes significant pain issues since having to stop NSAIDs. I recommended that he try Tylenol first. If this is not adequate, he can use NSAIDs on occasion. However, he should avoid regular use.

## 2022-11-23 NOTE — Assessment & Plan Note (Signed)
LDL during his most recent hospitalization was at goal at 59 on his current dose of statin therapy.  Continue Crestor 20 mg daily.

## 2022-11-23 NOTE — Assessment & Plan Note (Signed)
He had elevated enzymes during his most recent admission that were flat and more consistent with demand ischemia.  Coronary CTA demonstrated nonobstructive CAD.  He is not having chest discomfort to suggest angina.  He does not require aspirin as he is on Eliquis.  Continue Crestor 20 mg daily.

## 2022-11-23 NOTE — Assessment & Plan Note (Signed)
Head neck CTA in the hospital in April 2024 demonstrated 30-40% right carotid artery bifurcation stenosis and a patent left carotid artery.  Continue follow-up with vascular surgery as planned.

## 2022-11-24 ENCOUNTER — Other Ambulatory Visit: Payer: Self-pay | Admitting: Urology

## 2022-11-24 ENCOUNTER — Telehealth: Payer: Self-pay | Admitting: *Deleted

## 2022-11-24 NOTE — Telephone Encounter (Signed)
CALLED PATIENT TO INFORM OF FID. MARKERS AND SPACE OAR ON 12-15-22 AND HIS SIM ON 12-17-22 - ARRIVAL TIME- 8:45 AM @ [facility], LVM FOR A RETURN CALL

## 2022-11-25 ENCOUNTER — Telehealth: Payer: Self-pay | Admitting: *Deleted

## 2022-11-25 NOTE — Telephone Encounter (Signed)
RETURNED PATIENT'S PHONE CALL, SPOKE WITH PATIENT. ?

## 2022-11-26 ENCOUNTER — Encounter (HOSPITAL_BASED_OUTPATIENT_CLINIC_OR_DEPARTMENT_OTHER): Payer: Self-pay | Admitting: Urology

## 2022-11-26 ENCOUNTER — Other Ambulatory Visit: Payer: Self-pay

## 2022-11-26 NOTE — Progress Notes (Signed)
RN left voicemail for patient to call back if he had any questions regarding fiducial's and spaceOAR since recommendations have been received from cardiology.

## 2022-11-26 NOTE — Progress Notes (Signed)
Spoke w/ via phone for pre-op interview---pt Lab needs dos----    I stat           Lab results------eliquis clearance noet to stop eliquis 72 hours before surgery [facility] pa on chart/epic COVID test -----patient states asymptomatic no test needed Arrive at -------700 am 12-15-2022 NPO after MN NO Solid Food.  Clear liquids from MN until---600 am Med rec completed Medications to take morning of surgery -----none Diabetic medication -----n/a Patient instructed no nail polish to be worn day of surgery Patient instructed to bring photo id and insurance card day of surgery Patient aware to have Driver (ride ) / caregiver   pt aware will arrange  for 24 hours after surgery  Patient Special Instructions -----fleets enema qhs night before surgery Pre-Op special Instructions -----pt aware last dose of eliquis to be 12-11-2022  Patient verbalized understanding of instructions that were given at this phone interview. Patient denies shortness of breath, chest pain, fever, cough at this phone interview.  Echo 10-20-2022 EKG 10-28-2022 epic

## 2022-11-27 ENCOUNTER — Telehealth: Payer: Self-pay | Admitting: Genetic Counselor

## 2022-11-27 ENCOUNTER — Encounter: Payer: Self-pay | Admitting: Genetic Counselor

## 2022-11-27 DIAGNOSIS — Z1379 Encounter for other screening for genetic and chromosomal anomalies: Secondary | ICD-10-CM | POA: Insufficient documentation

## 2022-11-27 NOTE — Telephone Encounter (Signed)
I attempted to contact Peter Roberts to discuss his genetic testing results (43 genes). I left a voicemail requesting he call me back at [phone].  Lalla Brothers, MS, [facility] Genetic Counselor [email or web address] (P) [phone]

## 2022-12-07 ENCOUNTER — Ambulatory Visit: Payer: Self-pay | Admitting: Genetic Counselor

## 2022-12-07 ENCOUNTER — Encounter: Payer: Self-pay | Admitting: Genetic Counselor

## 2022-12-07 DIAGNOSIS — Z1379 Encounter for other screening for genetic and chromosomal anomalies: Secondary | ICD-10-CM

## 2022-12-07 NOTE — Telephone Encounter (Signed)
I attempted to contact Mr. Flaten to discuss his genetic testing results. This is the third attempt to contact him so I let him know that I will release the results to his email address. No pathogenic variants were identified in the 43 genes analyzed.   The test report has been scanned into EPIC and is located under the Molecular Pathology section of the Results Review tab.  A portion of the result report is included below for reference.   Lalla Brothers, MS, [facility] Genetic Counselor [email or web address] (P) [phone]

## 2022-12-07 NOTE — Progress Notes (Signed)
HPI:   Mr. Connelley was previously seen in the [facility] due to a personal and family history of cancer and concerns regarding a hereditary predisposition to cancer. Please refer to our prior cancer genetics clinic note for more information regarding our discussion, assessment and recommendations, at the time. Mr. Criddle recent genetic test results were disclosed to him, as were recommendations warranted by these results. These results and recommendations are discussed in more detail below.  CANCER HISTORY:  Oncology History  Prostate cancer (HCC)  04/24/2022 Cancer Staging   Staging form: Prostate, AJCC 8th Edition - Clinical stage from 04/24/2022: Stage IIC (cT1c, cN0, cM0, PSA: 6.4, Grade Group: 3) - Signed by Marcello Fennel, PA-C on 08/06/2022 Histopathologic type: Adenocarcinoma, NOS Stage prefix: Initial diagnosis Prostate specific antigen (PSA) range: Less than 10 Gleason primary pattern: 4 Gleason secondary pattern: 3 Gleason score: 7 Histologic grading system: 5 grade system Number of biopsy cores examined: 12 Number of biopsy cores positive: 4 Location of positive needle core biopsies: One side   08/03/2022 Initial Diagnosis   Malignant neoplasm of prostate (HCC)     FAMILY HISTORY:  We obtained a detailed, 4-generation family history.  Significant diagnoses are listed below:      Family History  Problem Relation Age of Onset   Lung disease Mother     Cancer Mother 81 - 41        gyn cancer   Heart attack Father     Heart disease Father     Breast cancer Sister 14   Prostate cancer Brother 45 - 83        negative genetic testing   Prostate cancer Brother 53 - 58   Cancer Maternal Grandfather          mouth or tongue cancer       Mr. Adie has 3 brothers, 2 were diagnosed with prostate cancer in their mid 39s, one brother had negative genetic testing. He has 3 sisters, 1 was diagnosed with breast cancer at age 37. His mother was diagnosed with  a gynecological cancer in her 15s, she died at age 69. His maternal grandfather was diagnosed with cancer of the mouth, he died in his 11s. There is no reported Ashkenazi Jewish ancestry.   GENETIC TEST RESULTS:  The [facility] Custom Panel found no pathogenic mutations.  The Custom Hereditary Cancers Panel offered by [facility] includes sequencing and/or deletion duplication testing of the following 43 genes: APC, ATM, AXIN2, BAP1, BARD1, BMPR1A, BRCA1, BRCA2, BRIP1, CDH1, CDK4, CDKN2A (p14ARF and p16INK4a only), CHEK2, CTNNA1, EPCAM (Deletion/duplication testing only), FH, GREM1 (promoter region duplication testing only), HOXB13, KIT, MBD4, MEN1, MLH1, MSH2, MSH3, MSH6, MUTYH, NF1, NHTL1, PALB2, PDGFRA, PMS2, POLD1, POLE, PTEN, RAD51C, RAD51D, SMAD4, SMARCA4. STK11, TP53, TSC1, TSC2, and VHL.  The test report has been scanned into EPIC and is located under the Molecular Pathology section of the Results Review tab.  A portion of the result report is included below for reference. Genetic testing reported out on 11/24/2022.     Even though a pathogenic variant was not identified, possible explanations for the cancer in the family may include: There may be no hereditary risk for cancer in the family. The cancers in Mr. Collington and/or his family may be due to other genetic or environmental factors. There may be a gene mutation in one of these genes that current testing methods cannot detect, but that chance is small. There could be another gene that has not yet  been discovered, or that we have not yet tested, that is responsible for the cancer diagnoses in the family.  It is also possible there is a hereditary cause for the cancer in the family that Mr. Schnipke did not inherit.  Therefore, it is important to remain in touch with cancer genetics in the future so that we can continue to offer Mr. Weitzel the most up to date genetic testing.   ADDITIONAL GENETIC TESTING:  We discussed with Mr. Dierker that his genetic  testing was fairly extensive.  If there are genes identified to increase cancer risk that can be analyzed in the future, we would be happy to discuss and coordinate this testing at that time.    CANCER SCREENING RECOMMENDATIONS:  Mr. Loomis test result is considered negative (normal).  This means that we have not identified a hereditary cause for his personal and family history of cancer at this time.   An individual's cancer risk and medical management are not determined by genetic test results alone. Overall cancer risk assessment incorporates additional factors, including personal medical history, family history, and any available genetic information that may result in a personalized plan for cancer prevention and surveillance. Therefore, it is recommended he continue to follow the cancer management and screening guidelines provided by his oncology and primary healthcare provider.  RECOMMENDATIONS FOR FAMILY MEMBERS:   Since he did not inherit a mutation in a cancer predisposition gene included on this panel, his children could not have inherited a mutation from him in one of these genes. Other members of the family may still carry a pathogenic variant in one of these genes that Mr. Diberardino did not inherit. Based on the family history, we recommend his siblings who have a history of cancer consider genetic counseling and testing.   FOLLOW-UP:  Cancer genetics is a rapidly advancing field and it is possible that new genetic tests will be appropriate for him and/or his family members in the future. We encouraged him to remain in contact with cancer genetics on an annual basis so we can update his personal and family histories and let him know of advances in cancer genetics that may benefit this family.   Our contact number was provided. Mr. Qu questions were answered to his satisfaction, and he knows he is welcome to call us at anytime with additional questions or concerns.   Lalla Brothers, MS,  [facility] Genetic Counselor [email or web address] (P) [phone]

## 2022-12-14 NOTE — H&P (Signed)
Patient is a 72 year old white male seen today for evaluation of elevated PSA noted on recent routine physical exam. Patient's PSA has been somewhat variable with values being 4.62 in November of 2019, 4.31 in March of 2020, 3.4 in June of 2021 and 4.29 in June of 2022. There is family history of prostate cancer Rhea 2 brothers in their mid 48s diagnosed with prostate cancer. One was treated with radiation 1 with prostatectomy. Both are alive. Patient has minimal voiding symptoms with nocturia 1 time at night and slight decreased force of stream but not bad enough to want to try any medication for this. Here to discuss next steps of management for elevated PSA with family history.  -09/01/21-patient with history of marginal PSA elevation and family history of prostate cancer as above. We had opted for surveillance. His most recent PSA is 5.30 with 12% free PSA component on 08/26/2021 this is actually up from 4.29 in June 2022. His PSA had been as high as 4.62 in November 2019. 2 brothers in their 16s with history of prostate cancer.  Micro urinalysis is clear and urine spun sediment  -03/06/22-patient with history of marginal PSA elevation and family history of prostate cancer as above. We opted for 23-month surveillance. His most recent PSA 6.35 on 02/27/2022 up from 5.30 on 08/27/2021. No interim voiding issues.  Micro urinalysis is clear on urine spun sediment  -04/24/22-patient with history of PSA elevation up to 6.35. Here now for transrectal ultrasound and prostate biopsy.   -05/04/22-patient with history of marginal PSA elevation and family history of prostate cancer. Underwent recent transrectal ultrasound prostate biopsy on 04/24/2022. This shows 4 out of 12 cores positive for adenocarcinoma the prostate. See results below.  TRUS/BX 04/24/22 PSA6.35  PATH:  1. Right base: Prostate adenocarcinoma Gleason score 4+3 equal 7 involving 40% of 1 core( pattern 4 equals 70%)  2. Right mid: Prostate adenocarcinoma  Gleason score 3+4 equal 7 involving 20% of 1 core (pattern 4 equals 10%).  3. Right base lateral: Prostate adenocarcinoma, Gleason score 4+3 equal 7, GRADE GROUP 3, involving 50% of 1 core (pattern 4 equals 80%)  4. Right mid lateral: Prostate adenocarcinoma, Gleason score 3+3 equal 6 involving 5% of 1 core  NCCN: Unfavorable Intermediate   -07/10/22-patient with history of recent diagnosis of adenocarcinoma the prostate with unfavorable intermediate disease as above. PSA 6.35. Has had PSMA PET scan in the interim which shows no evidence of metastatic disease on 06/01/2022 see report below. Here to discuss next steps of management.  CLINICAL DATA: Prostate carcinoma with biochemical recurrence. PSA  equal 6.35 prostate biopsy 02/25/2022   EXAM:  NUCLEAR MEDICINE PET SKULL BASE TO THIGH   TECHNIQUE:  8.42 mCi F18 Piflufolastat (Pylarify) was injected intravenously.  Full-ring PET imaging was performed from the skull base to thigh  after the radiotracer. CT data was obtained and used for attenuation  correction and anatomic localization.   COMPARISON: None Available.   FINDINGS:  NECK   No radiotracer activity in neck lymph nodes.   Incidental CT finding: None.   CHEST   No radiotracer accumulation within mediastinal or hilar lymph nodes.  No suspicious pulmonary nodules on the CT scan.   Incidental CT finding: None.   ABDOMEN/PELVIS   Prostate: Heterogeneous mild radiotracer activity within the  prostate gland without clear focal lesion identified. Seminal  vesicles normal.   Lymph nodes: No abnormal radiotracer accumulation within pelvic or  abdominal nodes.   Liver: No evidence of liver  metastasis.   Incidental CT finding: None.   SKELETON   No focal activity to suggest skeletal metastasis.   IMPRESSION:  1. No clear evidence prostate adenocarcinoma within the gland.  2. No evidence metastatic adenopathy in the pelvis or periaortic  retroperitoneum.  3. No  evidence of visceral metastasis or skeletal metastasis.    Electronically Signed  By: Genevive Bi M.D.  On: 06/01/2022 10:20      ALLERGIES: Sulfa - Hives, Skin Rash    MEDICATIONS: Levofloxacin 750 mg tablet Take 1 tab po 1 hour prior to procedure  Rosuvastatin Calcium  Vital Psa  Vitamin D3     GU PSH: Prostate Needle Biopsy - 04/24/2022     NON-GU PSH: Knee Arthroscopy/surgery Surgical Pathology, Gross And Microscopic Examination For Prostate Needle - 04/24/2022     GU PMH: Family Hx of Prostate Cancer - 05/04/2022, - 03/06/2022, - 09/01/2021, - 01/31/2021 Prostate Cancer - 05/04/2022 Elevated PSA - 04/24/2022, - 03/06/2022, - 09/01/2021, - 01/31/2021    NON-GU PMH: Hypercholesterolemia Hypertension    FAMILY HISTORY: Heart Attack - Father Heart Disease - Father High Blood Pressure - Runs in Family, Sister Lung trouble - Mother   SOCIAL HISTORY: Marital Status: Widowed Preferred Language: English; Ethnicity: Not Hispanic Or Latino; Race: White Current Smoking Status: Patient has never smoked.   Tobacco Use Assessment Completed: Used Tobacco in last 30 days? Does not use smokeless tobacco. Does not use drugs. Drinks 2 caffeinated drinks per day. Has not had a blood transfusion.    REVIEW OF SYSTEMS:    GU Review Male:   Patient denies frequent urination, hard to postpone urination, burning/ pain with urination, get up at night to urinate, leakage of urine, stream starts and stops, trouble starting your stream, have to strain to urinate , erection problems, and penile pain.  Gastrointestinal (Upper):   Patient denies nausea, vomiting, and indigestion/ heartburn.  Gastrointestinal (Lower):   Patient denies diarrhea and constipation.  Constitutional:   Patient denies fever, night sweats, weight loss, and fatigue.  Skin:   Patient denies skin rash/ lesion and itching.  Eyes:   Patient denies blurred vision and double vision.  Ears/ Nose/ Throat:   Patient denies  sore throat and sinus problems.  Hematologic/Lymphatic:   Patient denies swollen glands and easy bruising.  Cardiovascular:   Patient denies leg swelling and chest pains.  Respiratory:   Patient denies cough and shortness of breath.  Endocrine:   Patient denies excessive thirst.  Musculoskeletal:   Patient denies joint pain and back pain.  Neurological:   Patient denies headaches and dizziness.  Psychologic:   Patient denies depression and anxiety.   VITAL SIGNS: None   Complexity of Data:  Source Of History:  Patient  Lab Test Review:   PSA  Records Review:   Pathology Reports, Previous Doctor Records  X-Ray Review: PET- PSMA Scan: Reviewed Films. Reviewed Report. Discussed With Patient.     02/27/22 08/26/21  PSA  Total PSA 6.35 ng/mL 5.30 ng/mL  Free PSA 0.80 ng/mL 0.67 ng/mL  % Free PSA 13 % PSA 13 % PSA    PROCEDURES:          Urinalysis Dipstick Dipstick Cont'd  Color: Yellow Bilirubin: Neg mg/dL  Appearance: Clear Ketones: Neg mg/dL  Specific Gravity: 3.244 Blood: Neg ery/uL  pH: <=5.0 Protein: Neg mg/dL  Glucose: Neg mg/dL Urobilinogen: 0.2 mg/dL    Nitrites: Neg    Leukocyte Esterase: Neg leu/uL    ASSESSMENT:  ICD-10 Details  1 GU:   Prostate Cancer - C61 Acute, Complicated Injury  2   Family Hx of Prostate Cancer - Z80.42 Chronic, Stable     PLAN:           Schedule Return Visit/Planned Activity: ASAP Refer to physician - Artist Pais. Kathrynn Running, MD  Return Notes: Referral regarding newly diagnosed prostate cancer discussed radiotherapeutic options of management          Document Letter(s):  Created for Patient: Clinical Summary         Notes:   Discussed PET scan results showing no evidence of metastatic disease. We discussed treatment options and discussed utilizing radiation with either IMRT versus seed implant versus robotic prostatectomy. Will make referral to see Dr. Kathrynn Running first to discuss radiotherapeutic options of management he may also want  to see Dr. Laverle Patter regarding robotic prostatectomy but will start with Dr. Kathrynn Running first.  Addendum:Patient has been seen in multidisciplinary cancer clinic and after reviewing options of management is opted for IMRT.  He presents now to undergo fiducial marker placement with SpaceOAR gel implant

## 2022-12-15 ENCOUNTER — Telehealth: Payer: Self-pay | Admitting: *Deleted

## 2022-12-15 ENCOUNTER — Other Ambulatory Visit: Payer: Self-pay

## 2022-12-15 ENCOUNTER — Encounter (HOSPITAL_BASED_OUTPATIENT_CLINIC_OR_DEPARTMENT_OTHER): Payer: Self-pay | Admitting: Urology

## 2022-12-15 ENCOUNTER — Encounter (HOSPITAL_BASED_OUTPATIENT_CLINIC_OR_DEPARTMENT_OTHER)
Admission: RE | Disposition: A | Discharge status: Home / Self Care | Payer: Self-pay | Source: Home / Self Care | Attending: Urology

## 2022-12-15 ENCOUNTER — Ambulatory Visit (HOSPITAL_BASED_OUTPATIENT_CLINIC_OR_DEPARTMENT_OTHER): Payer: Medicare Other | Admitting: Certified Registered Nurse Anesthetist

## 2022-12-15 ENCOUNTER — Ambulatory Visit (HOSPITAL_BASED_OUTPATIENT_CLINIC_OR_DEPARTMENT_OTHER)
Admission: RE | Admit: 2022-12-15 | Discharge: 2022-12-15 | Disposition: A | Discharge status: Home / Self Care | Payer: Medicare Other | Attending: Urology | Admitting: Urology

## 2022-12-15 DIAGNOSIS — I2510 Atherosclerotic heart disease of native coronary artery without angina pectoris: Secondary | ICD-10-CM

## 2022-12-15 DIAGNOSIS — C61 Malignant neoplasm of prostate: Secondary | ICD-10-CM | POA: Diagnosis present

## 2022-12-15 DIAGNOSIS — I10 Essential (primary) hypertension: Secondary | ICD-10-CM | POA: Diagnosis not present

## 2022-12-15 DIAGNOSIS — Z79899 Other long term (current) drug therapy: Secondary | ICD-10-CM | POA: Insufficient documentation

## 2022-12-15 DIAGNOSIS — Z8042 Family history of malignant neoplasm of prostate: Secondary | ICD-10-CM | POA: Insufficient documentation

## 2022-12-15 DIAGNOSIS — Z01818 Encounter for other preprocedural examination: Secondary | ICD-10-CM

## 2022-12-15 HISTORY — PX: GOLD SEED IMPLANT: SHX6343

## 2022-12-15 HISTORY — PX: SPACE OAR INSTILLATION: SHX6769

## 2022-12-15 HISTORY — DX: Carpal tunnel syndrome, right upper limb: G56.01

## 2022-12-15 LAB — POCT I-STAT, CHEM 8
BUN: 26 mg/dL — ABNORMAL HIGH (ref 8–23)
Calcium, Ion: 1.26 mmol/L (ref 1.15–1.40)
Chloride: 104 mmol/L (ref 98–111)
Creatinine, Ser: 1.1 mg/dL (ref 0.61–1.24)
Glucose, Bld: 112 mg/dL — ABNORMAL HIGH (ref 70–99)
HCT: 46 % (ref 39.0–52.0)
Hemoglobin: 15.6 g/dL (ref 13.0–17.0)
Potassium: 4.2 mmol/L (ref 3.5–5.1)
Sodium: 140 mmol/L (ref 135–145)
TCO2: 21 mmol/L — ABNORMAL LOW (ref 22–32)

## 2022-12-15 SURGERY — INSERTION, GOLD SEEDS
Anesthesia: Monitor Anesthesia Care | Site: Prostate

## 2022-12-15 MED ORDER — FENTANYL CITRATE (PF) 100 MCG/2ML IJ SOLN
INTRAMUSCULAR | Status: AC
  Filled 2022-12-15: qty 2

## 2022-12-15 MED ORDER — PHENYLEPHRINE 80 MCG/ML (10ML) SYRINGE FOR IV PUSH (FOR BLOOD PRESSURE SUPPORT)
PREFILLED_SYRINGE | INTRAVENOUS | Status: DC | PRN
  Administered 2022-12-15 (×2): 80 ug via INTRAVENOUS

## 2022-12-15 MED ORDER — SODIUM CHLORIDE (PF) 0.9 % IJ SOLN
INTRAMUSCULAR | Status: DC | PRN
  Administered 2022-12-15: 10 mL

## 2022-12-15 MED ORDER — LIDOCAINE 2% (20 MG/ML) 5 ML SYRINGE
INTRAMUSCULAR | Status: DC | PRN
  Administered 2022-12-15: 40 mg via INTRAVENOUS

## 2022-12-15 MED ORDER — PROPOFOL 10 MG/ML IV BOLUS
INTRAVENOUS | Status: AC
  Filled 2022-12-15: qty 20

## 2022-12-15 MED ORDER — MIDAZOLAM HCL 2 MG/2ML IJ SOLN
INTRAMUSCULAR | Status: DC | PRN
  Administered 2022-12-15: 1 mg via INTRAVENOUS

## 2022-12-15 MED ORDER — PROPOFOL 10 MG/ML IV BOLUS
INTRAVENOUS | Status: DC | PRN
  Administered 2022-12-15: 20 mg via INTRAVENOUS

## 2022-12-15 MED ORDER — CIPROFLOXACIN IN D5W 400 MG/200ML IV SOLN
400.0000 mg | INTRAVENOUS | Status: AC
  Administered 2022-12-15: 400 mg via INTRAVENOUS

## 2022-12-15 MED ORDER — FENTANYL CITRATE (PF) 100 MCG/2ML IJ SOLN: 25.0000 ug | INTRAMUSCULAR | Status: DC | PRN

## 2022-12-15 MED ORDER — LIDOCAINE HCL 1 % IJ SOLN
INTRAMUSCULAR | Status: DC | PRN
  Administered 2022-12-15: 7 mL

## 2022-12-15 MED ORDER — MIDAZOLAM HCL 2 MG/2ML IJ SOLN
INTRAMUSCULAR | Status: AC
  Filled 2022-12-15: qty 2

## 2022-12-15 MED ORDER — LACTATED RINGERS IV SOLN: INTRAVENOUS | Status: DC

## 2022-12-15 MED ORDER — ACETAMINOPHEN 500 MG PO TABS: 1000.0000 mg | ORAL_TABLET | Freq: Once | ORAL | Status: DC

## 2022-12-15 MED ORDER — PROPOFOL 500 MG/50ML IV EMUL
INTRAVENOUS | Status: DC | PRN
  Administered 2022-12-15: 180 ug/kg/min via INTRAVENOUS

## 2022-12-15 MED ORDER — CIPROFLOXACIN IN D5W 400 MG/200ML IV SOLN
INTRAVENOUS | Status: AC
  Filled 2022-12-15: qty 200

## 2022-12-15 MED ORDER — FENTANYL CITRATE (PF) 250 MCG/5ML IJ SOLN
INTRAMUSCULAR | Status: DC | PRN
  Administered 2022-12-15: 50 ug via INTRAVENOUS

## 2022-12-15 MED ORDER — ONDANSETRON HCL 4 MG/2ML IJ SOLN
INTRAMUSCULAR | Status: DC | PRN
  Administered 2022-12-15: 4 mg via INTRAVENOUS

## 2022-12-15 SURGICAL SUPPLY — 26 items
BLADE CLIPPER SENSICLIP SURGIC (BLADE) ×1 IMPLANT
CNTNR URN SCR LID CUP LEK RST (MISCELLANEOUS) ×1 IMPLANT
CONT SPEC 4OZ STRL OR WHT (MISCELLANEOUS) ×1
COVER BACK TABLE 60X90IN (DRAPES) ×1 IMPLANT
DRSG TEGADERM 4X4.75 (GAUZE/BANDAGES/DRESSINGS) ×1 IMPLANT
DRSG TEGADERM 8X12 (GAUZE/BANDAGES/DRESSINGS) ×1 IMPLANT
GAUZE SPONGE 4X4 12PLY STRL (GAUZE/BANDAGES/DRESSINGS) ×1 IMPLANT
GLOVE BIO SURGEON STRL SZ7.5 (GLOVE) ×1 IMPLANT
GLOVE ECLIPSE 8.0 STRL XLNG CF (GLOVE) ×1 IMPLANT
GLOVE SURG ORTHO 8.5 STRL (GLOVE) ×1 IMPLANT
IMPL SPACEOAR VUE SYSTEM (Spacer) ×1 IMPLANT
IMPLANT SPACEOAR VUE SYSTEM (Spacer) ×1 IMPLANT
KIT TURNOVER CYSTO (KITS) ×1 IMPLANT
MARKER GOLD PRELOAD 1.2X3 (Urological Implant) ×1 IMPLANT
MARKER SKIN DUAL TIP RULER LAB (MISCELLANEOUS) ×1 IMPLANT
NDL SPNL 22GX3.5 QUINCKE BK (NEEDLE) ×1 IMPLANT
NEEDLE SPNL 22GX3.5 QUINCKE BK (NEEDLE) ×1 IMPLANT
SEED GOLD PRELOAD 1.2X3 (Urological Implant) ×1 IMPLANT
SHEATH ULTRASOUND LF (SHEATH) IMPLANT
SHEATH ULTRASOUND LTX NONSTRL (SHEATH) IMPLANT
SLEEVE SCD COMPRESS KNEE MED (STOCKING) ×1 IMPLANT
SURGILUBE 2OZ TUBE FLIPTOP (MISCELLANEOUS) ×1 IMPLANT
SYR 10ML LL (SYRINGE) IMPLANT
SYR CONTROL 10ML LL (SYRINGE) ×1 IMPLANT
TOWEL OR 17X24 6PK STRL BLUE (TOWEL DISPOSABLE) ×1 IMPLANT
UNDERPAD 30X36 HEAVY ABSORB (UNDERPADS AND DIAPERS) ×1 IMPLANT

## 2022-12-15 NOTE — Interval H&P Note (Signed)
History and Physical Interval Note:  12/15/2022 8:05 AM  Peter Roberts  has presented today for surgery, with the diagnosis of PROSTATE CANCER.  The various methods of treatment have been discussed with the patient and family. After consideration of risks, benefits and other options for treatment, the patient has consented to  Procedure(s) with comments: GOLD SEED IMPLANT (N/A) - 30 MINS SPACE OAR INSTILLATION (N/A) as a surgical intervention.  The patient's history has been reviewed, patient examined, no change in status, stable for surgery.  I have reviewed the patient's chart and labs.  Questions were answered to the patient's satisfaction.     Belva Agee

## 2022-12-15 NOTE — Op Note (Signed)
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 gentleman with clinically localized prostate cancer. After discussing management options for treatment, he elected to proceed with radiotherapy. He presents today for the above procedures. The potential risks, complications, alternative options, and expected recovery course have been discussed in detail with the patient and he has provided informed consent to proceed.  Description of procedure: The patient was administered preoperative antibiotics, placed in the dorsal lithotomy position, and prepped and draped in the usual sterile fashion. Next, transrectal ultrasonography was utilized to visualize the prostate. Three gold fiducial markers were then placed into the prostate via transperineal needles under ultrasound guidance at the left apex, left base, and right mid gland under direct ultrasound guidance. A site in the midline was then selected on the perineum for placement of an 18 g needle with saline. The needle was advanced above the rectum and below Denonvillier's fascia to the mid gland and confirmed to be in the midline on transverse imaging. One cc of saline was injected confirming appropriate expansion of this space. A total of 5 cc of saline was then injected to open the space further bilaterally. The saline syringe was then removed and the SpaceOAR hydrogel was injected with good distribution bilaterally. He tolerated the procedure well and without complications. He was given a voiding trial prior to discharge from the PACU.  Belva Agee, MD

## 2022-12-15 NOTE — Transfer of Care (Signed)
Immediate Anesthesia Transfer of Care Note  Patient: Peter Roberts  Procedure(s) Performed: GOLD SEED IMPLANT (Prostate) SPACE OAR INSTILLATION (Prostate)  Patient Location: PACU  Anesthesia Type:MAC  Level of Consciousness: sedated  Airway & Oxygen Therapy: Patient Spontanous Breathing and Patient connected to face mask oxygen  Post-op Assessment: Report given to RN and Post -op Vital signs reviewed and stable  Post vital signs: Reviewed and stable  Last Vitals:  Vitals Value Taken Time  BP 74/40 12/15/22 0854  Temp    Pulse 52 12/15/22 0855  Resp 10 12/15/22 0855  SpO2 100 % 12/15/22 0855  Vitals shown include unvalidated device data.  Last Pain:  Vitals:   12/15/22 0732  TempSrc: Oral  PainSc: 0-No pain      Patients Stated Pain Goal: 5 (12/15/22 0732)  Complications: No notable events documented.

## 2022-12-15 NOTE — Telephone Encounter (Signed)
CALLED PATIENT TO REMIND OF SIM APPT. FOR 12-17-22- ARRIVAL TIME- 8:45 AM @ [facility], INFORMED PATIENT TO ARRIVE WITH A FULL BLADDER, SPOKE WITH PATIENT AND HE IS AWARE OF THIS APPT. AND THE INSTRUCTIONS

## 2022-12-15 NOTE — Anesthesia Postprocedure Evaluation (Signed)
Anesthesia Post Note  Patient: Peter Roberts  Procedure(s) Performed: GOLD SEED IMPLANT (Prostate) SPACE OAR INSTILLATION (Prostate)     Patient location during evaluation: PACU Anesthesia Type: MAC Level of consciousness: awake and alert Pain management: pain level controlled Vital Signs Assessment: post-procedure vital signs reviewed and stable Respiratory status: spontaneous breathing, nonlabored ventilation, respiratory function stable and patient connected to nasal cannula oxygen Cardiovascular status: stable and blood pressure returned to baseline Postop Assessment: no apparent nausea or vomiting Anesthetic complications: no  No notable events documented.  Last Vitals:  Vitals:   12/15/22 0915 12/15/22 0930  BP: (!) 92/57 117/65  Pulse: (!) 51 (!) 53  Resp: (!) 8 10  Temp:    SpO2: 100% 99%    Last Pain:  Vitals:   12/15/22 0930  TempSrc:   PainSc: 0-No pain                 Shenica Holzheimer L Ernst Cumpston

## 2022-12-15 NOTE — Discharge Instructions (Signed)

## 2022-12-15 NOTE — Anesthesia Preprocedure Evaluation (Addendum)
Anesthesia Evaluation  Patient identified by MRN, date of birth, ID band Patient awake    Reviewed: Allergy & Precautions, NPO status , Patient's Chart, lab work & pertinent test results, reviewed documented beta blocker date and time   Airway Mallampati: II  TM Distance: >3 FB Neck ROM: Full    Dental no notable dental hx. (+) Teeth Intact, Dental Advisory Given   Pulmonary neg pulmonary ROS   Pulmonary exam normal breath sounds clear to auscultation       Cardiovascular hypertension, Pt. on home beta blockers and Pt. on medications + CAD  Normal cardiovascular exam Rhythm:Regular Rate:Normal  TTE 2024 1. Left ventricular ejection fraction, by estimation, is 60 to 65%. The  left ventricle has normal function. The left ventricle has no regional  wall motion abnormalities. Left ventricular diastolic parameters were  normal.   2. Right ventricular systolic function is normal. The right ventricular  size is normal.   3. The mitral valve is normal in structure. Trivial mitral valve  regurgitation. No evidence of mitral stenosis.   4. The aortic valve was not well visualized. Aortic valve regurgitation  is not visualized. No aortic stenosis is present.   5. The inferior vena cava is normal in size with greater than 50%  respiratory variability, suggesting right atrial pressure of 3 mmHg.     Neuro/Psych CVA  negative psych ROS   GI/Hepatic negative GI ROS, Neg liver ROS,,,  Endo/Other  negative endocrine ROS    Renal/GU negative Renal ROS  negative genitourinary   Musculoskeletal  (+) Arthritis ,    Abdominal   Peds  Hematology negative hematology ROS (+)   Anesthesia Other Findings   Reproductive/Obstetrics                             Anesthesia Physical Anesthesia Plan  ASA: 3  Anesthesia Plan: MAC   Post-op Pain Management: Tylenol PO (pre-op)*   Induction: Intravenous  PONV  Risk Score and Plan: 1 and Ondansetron, Dexamethasone, Midazolam and Propofol infusion  Airway Management Planned: Natural Airway and Simple Face Mask  Additional Equipment:   Intra-op Plan:   Post-operative Plan:   Informed Consent: I have reviewed the patients History and Physical, chart, labs and discussed the procedure including the risks, benefits and alternatives for the proposed anesthesia with the patient or authorized representative who has indicated his/her understanding and acceptance.     Dental advisory given  Plan Discussed with: CRNA  Anesthesia Plan Comments:        Anesthesia Quick Evaluation

## 2022-12-16 ENCOUNTER — Encounter (HOSPITAL_BASED_OUTPATIENT_CLINIC_OR_DEPARTMENT_OTHER): Payer: Self-pay | Admitting: Urology

## 2022-12-16 NOTE — Progress Notes (Signed)
  Radiation Oncology         [phone] ________________________________  Name: Peter Roberts MRN: [id number]  Date: 12/17/2022  DOB: 09-20-1950  SIMULATION AND TREATMENT PLANNING NOTE    ICD-10-CM   1. Prostate cancer (HCC)  C61       DIAGNOSIS:  72 y.o. 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    NARRATIVE:  The patient was brought to the CT Simulation planning suite.  Identity was confirmed.  All relevant records and images related to the planned course of therapy were reviewed.  The patient freely provided informed written consent to proceed with treatment after reviewing the details related to the planned course of therapy. The consent form was witnessed and verified by the simulation staff.  Then, the patient was set-up in a stable reproducible supine position for radiation therapy.  A vacuum lock pillow device was custom fabricated to position his legs in a reproducible immobilized position.  Then, I performed a urethrogram under sterile conditions to identify the prostatic apex.  CT images were obtained.  Surface markings were placed.  The CT images were loaded into the planning software.  Then the prostate target and avoidance structures including the rectum, bladder, bowel and hips were contoured.  Treatment planning then occurred.  The radiation prescription was entered and confirmed.  A total of one complex treatment devices was fabricated. I have requested : Intensity Modulated Radiotherapy (IMRT) is medically necessary for this case for the following reason:  Rectal sparing.Marland Kitchen  PLAN:  The patient will receive 70 Gy in 28 fractions.  ________________________________  Artist Pais Kathrynn Running, M.D.

## 2022-12-17 ENCOUNTER — Other Ambulatory Visit: Payer: Self-pay

## 2022-12-17 ENCOUNTER — Ambulatory Visit
Admission: RE | Admit: 2022-12-17 | Discharge: 2022-12-17 | Disposition: A | Discharge status: Home / Self Care | Payer: Medicare Other | Source: Ambulatory Visit | Attending: Radiation Oncology | Admitting: Radiation Oncology

## 2022-12-17 DIAGNOSIS — Z510 Encounter for antineoplastic radiation therapy: Secondary | ICD-10-CM | POA: Diagnosis not present

## 2022-12-17 DIAGNOSIS — C61 Malignant neoplasm of prostate: Secondary | ICD-10-CM | POA: Diagnosis present

## 2022-12-28 DIAGNOSIS — C61 Malignant neoplasm of prostate: Secondary | ICD-10-CM | POA: Insufficient documentation

## 2022-12-28 DIAGNOSIS — Z510 Encounter for antineoplastic radiation therapy: Secondary | ICD-10-CM | POA: Insufficient documentation

## 2022-12-28 NOTE — Progress Notes (Signed)
RN spoke with patient to assess any questions or concerns prior to radiation start on 7/9.   No additional needs at this time.

## 2022-12-29 ENCOUNTER — Other Ambulatory Visit: Payer: Self-pay

## 2022-12-29 ENCOUNTER — Ambulatory Visit
Admission: RE | Admit: 2022-12-29 | Discharge: 2022-12-29 | Disposition: A | Discharge status: Home / Self Care | Payer: Medicare Other | Source: Ambulatory Visit | Attending: Radiation Oncology | Admitting: Radiation Oncology

## 2022-12-29 DIAGNOSIS — C61 Malignant neoplasm of prostate: Secondary | ICD-10-CM | POA: Diagnosis not present

## 2022-12-29 LAB — RAD ONC ARIA SESSION SUMMARY
Course Elapsed Days: 0
Plan Fractions Treated to Date: 1
Plan Prescribed Dose Per Fraction: 2.5 Gy
Plan Total Fractions Prescribed: 28
Plan Total Prescribed Dose: 70 Gy
Reference Point Dosage Given to Date: 2.5 Gy
Reference Point Session Dosage Given: 2.5 Gy
Session Number: 1

## 2022-12-30 ENCOUNTER — Other Ambulatory Visit: Payer: Self-pay

## 2022-12-30 ENCOUNTER — Ambulatory Visit
Admission: RE | Admit: 2022-12-30 | Discharge: 2022-12-30 | Disposition: A | Discharge status: Home / Self Care | Payer: Medicare Other | Source: Ambulatory Visit | Attending: Radiation Oncology | Admitting: Radiation Oncology

## 2022-12-30 DIAGNOSIS — C61 Malignant neoplasm of prostate: Secondary | ICD-10-CM | POA: Diagnosis not present

## 2022-12-30 LAB — RAD ONC ARIA SESSION SUMMARY
Course Elapsed Days: 1
Plan Fractions Treated to Date: 2
Plan Prescribed Dose Per Fraction: 2.5 Gy
Plan Total Fractions Prescribed: 28
Plan Total Prescribed Dose: 70 Gy
Reference Point Dosage Given to Date: 5 Gy
Reference Point Session Dosage Given: 2.5 Gy
Session Number: 2

## 2022-12-31 ENCOUNTER — Other Ambulatory Visit: Payer: Self-pay

## 2022-12-31 ENCOUNTER — Ambulatory Visit
Admission: RE | Admit: 2022-12-31 | Discharge: 2022-12-31 | Disposition: A | Discharge status: Home / Self Care | Payer: Medicare Other | Source: Ambulatory Visit | Attending: Radiation Oncology | Admitting: Radiation Oncology

## 2022-12-31 DIAGNOSIS — C61 Malignant neoplasm of prostate: Secondary | ICD-10-CM | POA: Diagnosis not present

## 2022-12-31 LAB — RAD ONC ARIA SESSION SUMMARY
Course Elapsed Days: 2
Plan Fractions Treated to Date: 3
Plan Prescribed Dose Per Fraction: 2.5 Gy
Plan Total Fractions Prescribed: 28
Plan Total Prescribed Dose: 70 Gy
Reference Point Dosage Given to Date: 7.5 Gy
Reference Point Session Dosage Given: 2.5 Gy
Session Number: 3

## 2023-01-01 ENCOUNTER — Other Ambulatory Visit: Payer: Self-pay

## 2023-01-01 ENCOUNTER — Ambulatory Visit: Admission: RE | Admit: 2023-01-01 | Payer: Medicare Other | Source: Ambulatory Visit

## 2023-01-01 ENCOUNTER — Ambulatory Visit
Admission: RE | Admit: 2023-01-01 | Discharge: 2023-01-01 | Disposition: A | Discharge status: Home / Self Care | Payer: Medicare Other | Source: Ambulatory Visit | Attending: Radiation Oncology | Admitting: Radiation Oncology

## 2023-01-01 DIAGNOSIS — C61 Malignant neoplasm of prostate: Secondary | ICD-10-CM | POA: Diagnosis not present

## 2023-01-01 LAB — RAD ONC ARIA SESSION SUMMARY
Course Elapsed Days: 3
Plan Fractions Treated to Date: 4
Plan Prescribed Dose Per Fraction: 2.5 Gy
Plan Total Fractions Prescribed: 28
Plan Total Prescribed Dose: 70 Gy
Reference Point Dosage Given to Date: 10 Gy
Reference Point Session Dosage Given: 2.5 Gy
Session Number: 4

## 2023-01-04 ENCOUNTER — Other Ambulatory Visit: Payer: Self-pay

## 2023-01-04 ENCOUNTER — Ambulatory Visit
Admission: RE | Admit: 2023-01-04 | Discharge: 2023-01-04 | Disposition: A | Discharge status: Home / Self Care | Payer: Medicare Other | Source: Ambulatory Visit | Attending: Radiation Oncology | Admitting: Radiation Oncology

## 2023-01-04 DIAGNOSIS — C61 Malignant neoplasm of prostate: Secondary | ICD-10-CM | POA: Diagnosis not present

## 2023-01-04 LAB — RAD ONC ARIA SESSION SUMMARY
Course Elapsed Days: 6
Plan Fractions Treated to Date: 5
Plan Prescribed Dose Per Fraction: 2.5 Gy
Plan Total Fractions Prescribed: 28
Plan Total Prescribed Dose: 70 Gy
Reference Point Dosage Given to Date: 12.5 Gy
Reference Point Session Dosage Given: 2.5 Gy
Session Number: 5

## 2023-01-05 ENCOUNTER — Ambulatory Visit
Admission: RE | Admit: 2023-01-05 | Discharge: 2023-01-05 | Disposition: A | Discharge status: Home / Self Care | Payer: Medicare Other | Source: Ambulatory Visit | Attending: Radiation Oncology | Admitting: Radiation Oncology

## 2023-01-05 ENCOUNTER — Other Ambulatory Visit: Payer: Self-pay

## 2023-01-05 DIAGNOSIS — C61 Malignant neoplasm of prostate: Secondary | ICD-10-CM | POA: Diagnosis not present

## 2023-01-05 LAB — RAD ONC ARIA SESSION SUMMARY
Course Elapsed Days: 7
Plan Fractions Treated to Date: 6
Plan Prescribed Dose Per Fraction: 2.5 Gy
Plan Total Fractions Prescribed: 28
Plan Total Prescribed Dose: 70 Gy
Reference Point Dosage Given to Date: 15 Gy
Reference Point Session Dosage Given: 2.5 Gy
Session Number: 6

## 2023-01-06 ENCOUNTER — Ambulatory Visit
Admission: RE | Admit: 2023-01-06 | Discharge: 2023-01-06 | Disposition: A | Discharge status: Home / Self Care | Payer: Medicare Other | Source: Ambulatory Visit | Attending: Radiation Oncology | Admitting: Radiation Oncology

## 2023-01-06 ENCOUNTER — Other Ambulatory Visit: Payer: Self-pay

## 2023-01-06 ENCOUNTER — Ambulatory Visit (HOSPITAL_COMMUNITY)
Admission: RE | Admit: 2023-01-06 | Discharge: 2023-01-06 | Disposition: A | Discharge status: Home / Self Care | Payer: Medicare Other | Source: Ambulatory Visit | Attending: Internal Medicine | Admitting: Internal Medicine

## 2023-01-06 VITALS — BP 118/66 | HR 70 | Ht 67.0 in | Wt 181.6 lb

## 2023-01-06 DIAGNOSIS — I2510 Atherosclerotic heart disease of native coronary artery without angina pectoris: Secondary | ICD-10-CM | POA: Diagnosis not present

## 2023-01-06 DIAGNOSIS — Z8249 Family history of ischemic heart disease and other diseases of the circulatory system: Secondary | ICD-10-CM | POA: Diagnosis not present

## 2023-01-06 DIAGNOSIS — Z79899 Other long term (current) drug therapy: Secondary | ICD-10-CM | POA: Insufficient documentation

## 2023-01-06 DIAGNOSIS — Z6828 Body mass index (BMI) 28.0-28.9, adult: Secondary | ICD-10-CM | POA: Diagnosis not present

## 2023-01-06 DIAGNOSIS — I480 Paroxysmal atrial fibrillation: Secondary | ICD-10-CM | POA: Insufficient documentation

## 2023-01-06 DIAGNOSIS — N1831 Chronic kidney disease, stage 3a: Secondary | ICD-10-CM | POA: Diagnosis not present

## 2023-01-06 DIAGNOSIS — E669 Obesity, unspecified: Secondary | ICD-10-CM | POA: Insufficient documentation

## 2023-01-06 DIAGNOSIS — Z7901 Long term (current) use of anticoagulants: Secondary | ICD-10-CM | POA: Diagnosis not present

## 2023-01-06 DIAGNOSIS — Z809 Family history of malignant neoplasm, unspecified: Secondary | ICD-10-CM | POA: Diagnosis not present

## 2023-01-06 DIAGNOSIS — D6869 Other thrombophilia: Secondary | ICD-10-CM | POA: Diagnosis not present

## 2023-01-06 DIAGNOSIS — C61 Malignant neoplasm of prostate: Secondary | ICD-10-CM | POA: Insufficient documentation

## 2023-01-06 DIAGNOSIS — I129 Hypertensive chronic kidney disease with stage 1 through stage 4 chronic kidney disease, or unspecified chronic kidney disease: Secondary | ICD-10-CM | POA: Diagnosis not present

## 2023-01-06 DIAGNOSIS — E785 Hyperlipidemia, unspecified: Secondary | ICD-10-CM | POA: Diagnosis not present

## 2023-01-06 LAB — RAD ONC ARIA SESSION SUMMARY
Course Elapsed Days: 8
Plan Fractions Treated to Date: 7
Plan Prescribed Dose Per Fraction: 2.5 Gy
Plan Total Fractions Prescribed: 28
Plan Total Prescribed Dose: 70 Gy
Reference Point Dosage Given to Date: 17.5 Gy
Reference Point Session Dosage Given: 2.5 Gy
Session Number: 7

## 2023-01-06 NOTE — Progress Notes (Signed)
Primary Care Physician: Alyson Ingles, PA-C Primary Cardiologist: None Primary Electrophysiologist: None Referring Physician: Dr. Charlett Roberts Peter is a 72 y.o. male with a history of HTN, CAD, HLD, stage IIIa CKD, carotid artery stenosis s/p left CEA 9/23, prostate cancer, and atrial fibrillation who presents for consultation in the [facility]. The patient was admitted from 4/29-10/21/22 with initial symptoms of chest pain and dizziness. He was scheduled to have sigmoidoscopy on day of presentation due to rectal bleeding noted in March. When EMS arrived, he had difficulty speaking and facial droop but both resolved upon arrival to ED. He arrived in ED in atrial fibrillation with RVR. CT showed possible acute to subacute infarct in the left corona radiata. Neurology ordered MRI which was negative for stroke. Sigmoidoscopy performed during admission to avoid being off anticoagulation. Coronary CTA with coronary calcium score of 585. He converted to NSR on 4/30 and began Toprol 25 mg daily on 5/1; not on cardizem given underlying CAD. He was discharged on Eliquis 5 mg BID. Held dose of Eliquis night of 5/1 and advised to resume 5/2 if no rectal bleeding. Chest pain thought to be secondary to demand ischemia. Patient is on Eliquis 5 mg BID for a CHADS2VASC score of 5.  On evaluation today, he is currently in SR. As noted above, he felt chest pain and dizziness when he was in Afib. He felt rapid heart rate when he was in the ED. He feels well overall at this time; still not sure what caused him to have Afib. He denies any current chest pain or shortness of breath. He is compliant with Toprol 25 mg qhs.   He is compliant with anticoagulation and has not missed any doses. He has no bleeding concerns.  He does not drink alcohol or caffeinated beverages. He does not believe he snores or stops breathing at night.   On follow up 01/06/23, he is currently in NSR. He is  undergoing radiation therapy for prostate cancer. He has not had any episodes of Afib since last office visit. He has no bleeding issues with anticoagulation.  Today, he denies symptoms of palpitations, chest pain, shortness of breath, orthopnea, PND, lower extremity edema, dizziness, presyncope, syncope, snoring, daytime somnolence, bleeding, or neurologic sequela. The patient is tolerating medications without difficulties and is otherwise without complaint today.   Atrial Fibrillation Risk Factors:  he does not have symptoms or diagnosis of sleep apnea. he does not have a history of rheumatic fever. he does not have a history of alcohol use. The patient does not have a history of early familial atrial fibrillation or other arrhythmias.  he has a BMI of Body mass index is 28.44 kg/m.Marland Kitchen Filed Weights   01/06/23 1331  Weight: 82.4 kg     Family History  Problem Relation Age of Onset   Lung disease Mother    Cancer Mother 35 - 59       gyn cancer   Heart attack Father    Heart disease Father    Breast cancer Sister 43   Prostate cancer Brother 41 - 71       negative genetic testing   Prostate cancer Brother 75 - 58   Cancer Maternal Grandfather        mouth cancer    Atrial Fibrillation Management history:  Previous antiarrhythmic drugs: None Previous cardioversions: None Previous ablations: None Anticoagulation history: Eliquis 5 mg BID   Past Medical History:  Diagnosis Date  Arthritis    Carotid stenosis, asymptomatic, bilateral 2017   followed by pcp and vascular dr Chestine Spore;    s/p left CEA 02-25-2022 for stenosis >80%; (post op note in epic 03-17-2022 healing well)   last duplex in epic 02-03-2022 right ICA 40-59% and left ICA 80-99%   Elevated blood pressure reading without diagnosis of hypertension    Family history of prostate cancer    History of anal fissures    Hyperlipidemia    Malignant neoplasm prostate [facility]) 04/2022   urologist--- dr newsome/  radiation  oncologist--- dr Kathrynn Running;  dx 11/ 2023,  Gleason 4+3   Microcytic anemia    Right carpal tunnel syndrome    Past Surgical History:  Procedure Laterality Date   BIOPSY  10/21/2022   Procedure: BIOPSY;  Surgeon: Lynann Bologna, DO;  Location: [facility] ENDOSCOPY;  Service: Gastroenterology;;   CARPAL TUNNEL RELEASE Left 12/2021   COLONOSCOPY  2019   ENDARTERECTOMY Left 02/25/2022   Procedure: LEFT CAROTID ENDARTERECTOMY;  Surgeon: Cephus Shelling, MD;  Location: [facility] OR;  Service: Vascular;  Laterality: Left;   FLEXIBLE SIGMOIDOSCOPY N/A 10/21/2022   Procedure: FLEXIBLE SIGMOIDOSCOPY;  Surgeon: Lynann Bologna, DO;  Location: [facility] ENDOSCOPY;  Service: Gastroenterology;  Laterality: N/A;   GOLD SEED IMPLANT N/A 12/15/2022   Procedure: GOLD SEED IMPLANT;  Surgeon: Belva Agee, MD;  Location: [facility];  Service: Urology;  Laterality: N/A;  30 MINS   KNEE ARTHROSCOPY Right 2002   SPACE OAR INSTILLATION N/A 12/15/2022   Procedure: SPACE OAR INSTILLATION;  Surgeon: Belva Agee, MD;  Location: [facility];  Service: Urology;  Laterality: N/A;    Current Outpatient Medications  Medication Sig Dispense Refill   apixaban (ELIQUIS) 5 MG TABS tablet Take 1 tablet (5 mg total) by mouth 2 (two) times daily. 60 tablet 3   Cholecalciferol (VITAMIN D3) 125 MCG (5000 UT) capsule Take 5,000 Units by mouth daily.     diclofenac Sodium (VOLTAREN) 1 % GEL Apply 4 g topically 4 (four) times daily as needed (pain).     Ferrous Sulfate (IRON) 90 (18 Fe) MG TABS Take 90 mg by mouth every evening.     Magnesium 500 MG CAPS Take 1 capsule by mouth every evening.     metoprolol succinate (TOPROL-XL) 25 MG 24 hr tablet Take 1 tablet (25 mg total) by mouth daily. (Patient taking differently: Take 25 mg by mouth every evening.) 30 tablet 3   Oil of Oregano 1500 MG CAPS Take 1 capsule by mouth daily.     OVER THE COUNTER MEDICATION Take 3 capsules by mouth daily after lunch.  Prostagenix     OVER THE COUNTER MEDICATION Blood booster 2 tabs daily     Probiotic Product (PROBIOTIC DAILY) CAPS Take 1 capsule by mouth daily.     rosuvastatin (CRESTOR) 20 MG tablet Take 1 tablet (20 mg total) by mouth every evening. (Patient taking differently: Take 20 mg by mouth every evening.) 90 tablet 2   No current facility-administered medications for this encounter.    Allergies  Allergen Reactions   Sulfa Antibiotics Hives   ROS- All systems are reviewed and negative except as per the HPI above.  Physical Exam: Vitals:   01/06/23 1331  BP: 118/66  Pulse: 70  Weight: 82.4 kg  Height: 5\' 7"  (1.702 m)    GEN- The patient is well appearing, alert and oriented x 3 today.   Neck - no JVD or carotid bruit  noted Lungs- Clear to ausculation bilaterally, normal work of breathing Heart- Regular rate and rhythm, no murmurs, rubs or gallops, PMI not laterally displaced Extremities- no clubbing, cyanosis, or edema Skin - no rash or ecchymosis noted   Wt Readings from Last 3 Encounters:  01/06/23 82.4 kg  12/15/22 83.3 kg  11/23/22 83.8 kg    EKG today demonstrates  Vent. rate 70 BPM PR interval 134 ms QRS duration 80 ms QT/QTcB 374/403 ms P-R-T axes 71 72 38 Normal sinus rhythm Normal ECG When compared with ECG of 28-Oct-2022 13:32, PREVIOUS ECG IS PRESENT  Echo 10/20/22 demonstrated:  1. Left ventricular ejection fraction, by estimation, is 60 to 65%. The  left ventricle has normal function. The left ventricle has no regional  wall motion abnormalities. Left ventricular diastolic parameters were  normal.   2. Right ventricular systolic function is normal. The right ventricular  size is normal.   3. The mitral valve is normal in structure. Trivial mitral valve  regurgitation. No evidence of mitral stenosis.   4. The aortic valve was not well visualized. Aortic valve regurgitation  is not visualized. No aortic stenosis is present.   5. The inferior vena cava  is normal in size with greater than 50%  respiratory variability, suggesting right atrial pressure of 3 mmHg.  Epic records are reviewed at length today.  CHA2DS2-VASc Score = 5  The patient's score is based upon: CHF History: 0 HTN History: 1 Diabetes History: 0 Stroke History: 2 Vascular Disease History: 1 Age Score: 1 Gender Score: 0      ASSESSMENT AND PLAN: Paroxysmal Atrial Fibrillation (ICD10:  I48.0) The patient's CHA2DS2-VASc score is 5, indicating a 7.2% annual risk of stroke.    He is currently in NSR. Continue conservative observation. Rhythm monitoring device recommended. Continue Toprol 25 mg qhs.   2. Secondary Hypercoagulable State (ICD10:  D68.69) The patient is at significant risk for stroke/thromboembolism based upon his CHA2DS2-VASc Score of 5.  Continue Apixaban (Eliquis).   No missed doses.  3. Obesity Body mass index is 28.44 kg/m. Lifestyle modification was discussed at length including regular exercise and weight reduction. Continue with daily activity as tolerated.    Follow up 6 months.   Lake Bells, PA-C [facility] [address] [place], [place] [address] [phone] 01/06/2023 2:35 PM

## 2023-01-07 ENCOUNTER — Ambulatory Visit
Admission: RE | Admit: 2023-01-07 | Discharge: 2023-01-07 | Disposition: A | Discharge status: Home / Self Care | Payer: Medicare Other | Source: Ambulatory Visit | Attending: Radiation Oncology | Admitting: Radiation Oncology

## 2023-01-07 ENCOUNTER — Other Ambulatory Visit: Payer: Self-pay

## 2023-01-07 DIAGNOSIS — C61 Malignant neoplasm of prostate: Secondary | ICD-10-CM | POA: Diagnosis not present

## 2023-01-07 LAB — RAD ONC ARIA SESSION SUMMARY
Course Elapsed Days: 9
Plan Fractions Treated to Date: 8
Plan Prescribed Dose Per Fraction: 2.5 Gy
Plan Total Fractions Prescribed: 28
Plan Total Prescribed Dose: 70 Gy
Reference Point Dosage Given to Date: 20 Gy
Reference Point Session Dosage Given: 2.5 Gy
Session Number: 8

## 2023-01-08 ENCOUNTER — Ambulatory Visit
Admission: RE | Admit: 2023-01-08 | Discharge: 2023-01-08 | Disposition: A | Discharge status: Home / Self Care | Payer: Medicare Other | Source: Ambulatory Visit | Attending: Radiation Oncology | Admitting: Radiation Oncology

## 2023-01-08 ENCOUNTER — Other Ambulatory Visit: Payer: Self-pay

## 2023-01-08 DIAGNOSIS — C61 Malignant neoplasm of prostate: Secondary | ICD-10-CM | POA: Diagnosis not present

## 2023-01-08 LAB — RAD ONC ARIA SESSION SUMMARY
Course Elapsed Days: 10
Plan Fractions Treated to Date: 9
Plan Prescribed Dose Per Fraction: 2.5 Gy
Plan Total Fractions Prescribed: 28
Plan Total Prescribed Dose: 70 Gy
Reference Point Dosage Given to Date: 22.5 Gy
Reference Point Session Dosage Given: 2.5 Gy
Session Number: 9

## 2023-01-10 ENCOUNTER — Other Ambulatory Visit: Payer: Self-pay | Admitting: Vascular Surgery

## 2023-01-11 ENCOUNTER — Other Ambulatory Visit: Payer: Self-pay

## 2023-01-11 ENCOUNTER — Ambulatory Visit
Admission: RE | Admit: 2023-01-11 | Discharge: 2023-01-11 | Disposition: A | Discharge status: Home / Self Care | Payer: Medicare Other | Source: Ambulatory Visit | Attending: Radiation Oncology | Admitting: Radiation Oncology

## 2023-01-11 ENCOUNTER — Other Ambulatory Visit: Payer: Self-pay | Admitting: Vascular Surgery

## 2023-01-11 DIAGNOSIS — C61 Malignant neoplasm of prostate: Secondary | ICD-10-CM | POA: Diagnosis not present

## 2023-01-11 LAB — RAD ONC ARIA SESSION SUMMARY
Course Elapsed Days: 13
Plan Fractions Treated to Date: 10
Plan Prescribed Dose Per Fraction: 2.5 Gy
Plan Total Fractions Prescribed: 28
Plan Total Prescribed Dose: 70 Gy
Reference Point Dosage Given to Date: 25 Gy
Reference Point Session Dosage Given: 2.5 Gy
Session Number: 10

## 2023-01-12 ENCOUNTER — Other Ambulatory Visit: Payer: Self-pay

## 2023-01-12 ENCOUNTER — Ambulatory Visit
Admission: RE | Admit: 2023-01-12 | Discharge: 2023-01-12 | Disposition: A | Discharge status: Home / Self Care | Payer: Medicare Other | Source: Ambulatory Visit | Attending: Radiation Oncology | Admitting: Radiation Oncology

## 2023-01-12 DIAGNOSIS — C61 Malignant neoplasm of prostate: Secondary | ICD-10-CM | POA: Diagnosis not present

## 2023-01-12 LAB — RAD ONC ARIA SESSION SUMMARY
Course Elapsed Days: 14
Plan Fractions Treated to Date: 11
Plan Prescribed Dose Per Fraction: 2.5 Gy
Plan Total Fractions Prescribed: 28
Plan Total Prescribed Dose: 70 Gy
Reference Point Dosage Given to Date: 27.5 Gy
Reference Point Session Dosage Given: 2.5 Gy
Session Number: 11

## 2023-01-13 ENCOUNTER — Other Ambulatory Visit: Payer: Self-pay

## 2023-01-13 ENCOUNTER — Ambulatory Visit
Admission: RE | Admit: 2023-01-13 | Discharge: 2023-01-13 | Disposition: A | Discharge status: Home / Self Care | Payer: Medicare Other | Source: Ambulatory Visit | Attending: Radiation Oncology | Admitting: Radiation Oncology

## 2023-01-13 DIAGNOSIS — C61 Malignant neoplasm of prostate: Secondary | ICD-10-CM | POA: Diagnosis not present

## 2023-01-13 LAB — RAD ONC ARIA SESSION SUMMARY
Course Elapsed Days: 15
Plan Fractions Treated to Date: 12
Plan Prescribed Dose Per Fraction: 2.5 Gy
Plan Total Fractions Prescribed: 28
Plan Total Prescribed Dose: 70 Gy
Reference Point Dosage Given to Date: 30 Gy
Reference Point Session Dosage Given: 2.5 Gy
Session Number: 12

## 2023-01-14 ENCOUNTER — Other Ambulatory Visit: Payer: Self-pay

## 2023-01-14 ENCOUNTER — Ambulatory Visit
Admission: RE | Admit: 2023-01-14 | Discharge: 2023-01-14 | Disposition: A | Discharge status: Home / Self Care | Payer: Medicare Other | Source: Ambulatory Visit | Attending: Radiation Oncology | Admitting: Radiation Oncology

## 2023-01-14 DIAGNOSIS — C61 Malignant neoplasm of prostate: Secondary | ICD-10-CM | POA: Diagnosis not present

## 2023-01-14 LAB — RAD ONC ARIA SESSION SUMMARY
Course Elapsed Days: 16
Plan Fractions Treated to Date: 13
Plan Prescribed Dose Per Fraction: 2.5 Gy
Plan Total Fractions Prescribed: 28
Plan Total Prescribed Dose: 70 Gy
Reference Point Dosage Given to Date: 32.5 Gy
Reference Point Session Dosage Given: 2.5 Gy
Session Number: 13

## 2023-01-15 ENCOUNTER — Ambulatory Visit
Admission: RE | Admit: 2023-01-15 | Discharge: 2023-01-15 | Disposition: A | Discharge status: Home / Self Care | Payer: Medicare Other | Source: Ambulatory Visit | Attending: Radiation Oncology | Admitting: Radiation Oncology

## 2023-01-15 ENCOUNTER — Ambulatory Visit: Admission: RE | Admit: 2023-01-15 | Payer: Medicare Other | Source: Ambulatory Visit

## 2023-01-15 ENCOUNTER — Other Ambulatory Visit: Payer: Self-pay

## 2023-01-15 DIAGNOSIS — C61 Malignant neoplasm of prostate: Secondary | ICD-10-CM | POA: Diagnosis not present

## 2023-01-15 LAB — RAD ONC ARIA SESSION SUMMARY
Course Elapsed Days: 17
Plan Fractions Treated to Date: 14
Plan Prescribed Dose Per Fraction: 2.5 Gy
Plan Total Fractions Prescribed: 28
Plan Total Prescribed Dose: 70 Gy
Reference Point Dosage Given to Date: 35 Gy
Reference Point Session Dosage Given: 2.5 Gy
Session Number: 14

## 2023-01-18 ENCOUNTER — Ambulatory Visit
Admission: RE | Admit: 2023-01-18 | Discharge: 2023-01-18 | Disposition: A | Discharge status: Home / Self Care | Payer: Medicare Other | Source: Ambulatory Visit | Attending: Radiation Oncology | Admitting: Radiation Oncology

## 2023-01-18 ENCOUNTER — Other Ambulatory Visit: Payer: Self-pay

## 2023-01-18 DIAGNOSIS — C61 Malignant neoplasm of prostate: Secondary | ICD-10-CM | POA: Diagnosis not present

## 2023-01-18 LAB — RAD ONC ARIA SESSION SUMMARY
Course Elapsed Days: 20
Plan Fractions Treated to Date: 15
Plan Prescribed Dose Per Fraction: 2.5 Gy
Plan Total Fractions Prescribed: 28
Plan Total Prescribed Dose: 70 Gy
Reference Point Dosage Given to Date: 37.5 Gy
Reference Point Session Dosage Given: 2.5 Gy
Session Number: 15

## 2023-01-19 ENCOUNTER — Other Ambulatory Visit: Payer: Self-pay

## 2023-01-19 ENCOUNTER — Ambulatory Visit
Admission: RE | Admit: 2023-01-19 | Discharge: 2023-01-19 | Disposition: A | Discharge status: Home / Self Care | Payer: Medicare Other | Source: Ambulatory Visit | Attending: Radiation Oncology | Admitting: Radiation Oncology

## 2023-01-19 DIAGNOSIS — C61 Malignant neoplasm of prostate: Secondary | ICD-10-CM | POA: Diagnosis not present

## 2023-01-19 LAB — RAD ONC ARIA SESSION SUMMARY
Course Elapsed Days: 21
Plan Fractions Treated to Date: 16
Plan Prescribed Dose Per Fraction: 2.5 Gy
Plan Total Fractions Prescribed: 28
Plan Total Prescribed Dose: 70 Gy
Reference Point Dosage Given to Date: 40 Gy
Reference Point Session Dosage Given: 2.5 Gy
Session Number: 16

## 2023-01-20 ENCOUNTER — Ambulatory Visit: Admission: RE | Admit: 2023-01-20 | Payer: Medicare Other | Source: Ambulatory Visit

## 2023-01-20 ENCOUNTER — Other Ambulatory Visit: Payer: Self-pay

## 2023-01-20 DIAGNOSIS — C61 Malignant neoplasm of prostate: Secondary | ICD-10-CM | POA: Diagnosis not present

## 2023-01-20 LAB — RAD ONC ARIA SESSION SUMMARY
Course Elapsed Days: 22
Plan Fractions Treated to Date: 17
Plan Prescribed Dose Per Fraction: 2.5 Gy
Plan Total Fractions Prescribed: 28
Plan Total Prescribed Dose: 70 Gy
Reference Point Dosage Given to Date: 42.5 Gy
Reference Point Session Dosage Given: 2.5 Gy
Session Number: 17

## 2023-01-21 ENCOUNTER — Other Ambulatory Visit: Payer: Self-pay

## 2023-01-21 ENCOUNTER — Ambulatory Visit
Admission: RE | Admit: 2023-01-21 | Discharge: 2023-01-21 | Disposition: A | Discharge status: Home / Self Care | Payer: Medicare Other | Source: Ambulatory Visit | Attending: Radiation Oncology | Admitting: Radiation Oncology

## 2023-01-21 ENCOUNTER — Other Ambulatory Visit: Payer: Self-pay | Admitting: Urology

## 2023-01-21 ENCOUNTER — Other Ambulatory Visit: Payer: Self-pay | Admitting: *Deleted

## 2023-01-21 DIAGNOSIS — C61 Malignant neoplasm of prostate: Secondary | ICD-10-CM | POA: Insufficient documentation

## 2023-01-21 DIAGNOSIS — Z510 Encounter for antineoplastic radiation therapy: Secondary | ICD-10-CM | POA: Diagnosis not present

## 2023-01-21 DIAGNOSIS — I6529 Occlusion and stenosis of unspecified carotid artery: Secondary | ICD-10-CM

## 2023-01-21 LAB — RAD ONC ARIA SESSION SUMMARY
Course Elapsed Days: 23
Plan Fractions Treated to Date: 18
Plan Prescribed Dose Per Fraction: 2.5 Gy
Plan Total Fractions Prescribed: 28
Plan Total Prescribed Dose: 70 Gy
Reference Point Dosage Given to Date: 45 Gy
Reference Point Session Dosage Given: 2.5 Gy
Session Number: 18

## 2023-01-21 MED ORDER — SILODOSIN 8 MG PO CAPS: 8.0000 mg | ORAL_CAPSULE | Freq: Every day | ORAL | 2 refills | Status: DC

## 2023-01-22 ENCOUNTER — Other Ambulatory Visit: Payer: Self-pay

## 2023-01-22 ENCOUNTER — Ambulatory Visit
Admission: RE | Admit: 2023-01-22 | Discharge: 2023-01-22 | Disposition: A | Discharge status: Home / Self Care | Payer: Medicare Other | Source: Ambulatory Visit | Attending: Radiation Oncology | Admitting: Radiation Oncology

## 2023-01-22 DIAGNOSIS — C61 Malignant neoplasm of prostate: Secondary | ICD-10-CM | POA: Diagnosis not present

## 2023-01-22 LAB — RAD ONC ARIA SESSION SUMMARY
Course Elapsed Days: 24
Plan Fractions Treated to Date: 19
Plan Prescribed Dose Per Fraction: 2.5 Gy
Plan Total Fractions Prescribed: 28
Plan Total Prescribed Dose: 70 Gy
Reference Point Dosage Given to Date: 47.5 Gy
Reference Point Session Dosage Given: 2.5 Gy
Session Number: 19

## 2023-01-25 ENCOUNTER — Ambulatory Visit
Admission: RE | Admit: 2023-01-25 | Discharge: 2023-01-25 | Disposition: A | Discharge status: Home / Self Care | Payer: Medicare Other | Source: Ambulatory Visit | Attending: Radiation Oncology | Admitting: Radiation Oncology

## 2023-01-25 ENCOUNTER — Other Ambulatory Visit: Payer: Self-pay

## 2023-01-25 DIAGNOSIS — C61 Malignant neoplasm of prostate: Secondary | ICD-10-CM | POA: Diagnosis not present

## 2023-01-25 LAB — RAD ONC ARIA SESSION SUMMARY
Course Elapsed Days: 27
Plan Fractions Treated to Date: 20
Plan Prescribed Dose Per Fraction: 2.5 Gy
Plan Total Fractions Prescribed: 28
Plan Total Prescribed Dose: 70 Gy
Reference Point Dosage Given to Date: 50 Gy
Reference Point Session Dosage Given: 2.5 Gy
Session Number: 20

## 2023-01-26 ENCOUNTER — Other Ambulatory Visit: Payer: Self-pay

## 2023-01-26 ENCOUNTER — Ambulatory Visit
Admission: RE | Admit: 2023-01-26 | Discharge: 2023-01-26 | Disposition: A | Discharge status: Home / Self Care | Payer: Medicare Other | Source: Ambulatory Visit | Attending: Radiation Oncology | Admitting: Radiation Oncology

## 2023-01-26 DIAGNOSIS — C61 Malignant neoplasm of prostate: Secondary | ICD-10-CM | POA: Diagnosis not present

## 2023-01-26 LAB — RAD ONC ARIA SESSION SUMMARY
Course Elapsed Days: 28
Plan Fractions Treated to Date: 21
Plan Prescribed Dose Per Fraction: 2.5 Gy
Plan Total Fractions Prescribed: 28
Plan Total Prescribed Dose: 70 Gy
Reference Point Dosage Given to Date: 52.5 Gy
Reference Point Session Dosage Given: 2.5 Gy
Session Number: 21

## 2023-01-27 ENCOUNTER — Other Ambulatory Visit: Payer: Self-pay

## 2023-01-27 ENCOUNTER — Ambulatory Visit
Admission: RE | Admit: 2023-01-27 | Discharge: 2023-01-27 | Disposition: A | Discharge status: Home / Self Care | Payer: Medicare Other | Source: Ambulatory Visit | Attending: Radiation Oncology | Admitting: Radiation Oncology

## 2023-01-27 DIAGNOSIS — C61 Malignant neoplasm of prostate: Secondary | ICD-10-CM | POA: Diagnosis not present

## 2023-01-27 LAB — RAD ONC ARIA SESSION SUMMARY
Course Elapsed Days: 29
Plan Fractions Treated to Date: 22
Plan Prescribed Dose Per Fraction: 2.5 Gy
Plan Total Fractions Prescribed: 28
Plan Total Prescribed Dose: 70 Gy
Reference Point Dosage Given to Date: 55 Gy
Reference Point Session Dosage Given: 2.5 Gy
Session Number: 22

## 2023-01-28 ENCOUNTER — Ambulatory Visit: Payer: Medicare Other

## 2023-01-29 ENCOUNTER — Other Ambulatory Visit: Payer: Self-pay

## 2023-01-29 ENCOUNTER — Ambulatory Visit
Admission: RE | Admit: 2023-01-29 | Discharge: 2023-01-29 | Disposition: A | Discharge status: Home / Self Care | Payer: Medicare Other | Source: Ambulatory Visit | Attending: Radiation Oncology | Admitting: Radiation Oncology

## 2023-01-29 DIAGNOSIS — C61 Malignant neoplasm of prostate: Secondary | ICD-10-CM | POA: Diagnosis not present

## 2023-01-29 LAB — RAD ONC ARIA SESSION SUMMARY
Course Elapsed Days: 31
Plan Fractions Treated to Date: 23
Plan Prescribed Dose Per Fraction: 2.5 Gy
Plan Total Fractions Prescribed: 28
Plan Total Prescribed Dose: 70 Gy
Reference Point Dosage Given to Date: 57.5 Gy
Reference Point Session Dosage Given: 2.5 Gy
Session Number: 23

## 2023-02-01 ENCOUNTER — Ambulatory Visit
Admission: RE | Admit: 2023-02-01 | Discharge: 2023-02-01 | Disposition: A | Discharge status: Home / Self Care | Payer: Medicare Other | Source: Ambulatory Visit | Attending: Radiation Oncology | Admitting: Radiation Oncology

## 2023-02-01 ENCOUNTER — Other Ambulatory Visit: Payer: Self-pay

## 2023-02-01 DIAGNOSIS — C61 Malignant neoplasm of prostate: Secondary | ICD-10-CM | POA: Diagnosis not present

## 2023-02-01 LAB — RAD ONC ARIA SESSION SUMMARY
Course Elapsed Days: 34
Plan Fractions Treated to Date: 24
Plan Prescribed Dose Per Fraction: 2.5 Gy
Plan Total Fractions Prescribed: 28
Plan Total Prescribed Dose: 70 Gy
Reference Point Dosage Given to Date: 60 Gy
Reference Point Session Dosage Given: 2.5 Gy
Session Number: 24

## 2023-02-02 ENCOUNTER — Other Ambulatory Visit: Payer: Self-pay

## 2023-02-02 ENCOUNTER — Ambulatory Visit
Admission: RE | Admit: 2023-02-02 | Discharge: 2023-02-02 | Disposition: A | Discharge status: Home / Self Care | Payer: Medicare Other | Source: Ambulatory Visit | Attending: Radiation Oncology | Admitting: Radiation Oncology

## 2023-02-02 DIAGNOSIS — C61 Malignant neoplasm of prostate: Secondary | ICD-10-CM | POA: Diagnosis not present

## 2023-02-02 LAB — RAD ONC ARIA SESSION SUMMARY
Course Elapsed Days: 35
Plan Fractions Treated to Date: 25
Plan Prescribed Dose Per Fraction: 2.5 Gy
Plan Total Fractions Prescribed: 28
Plan Total Prescribed Dose: 70 Gy
Reference Point Dosage Given to Date: 62.5 Gy
Reference Point Session Dosage Given: 2.5 Gy
Session Number: 25

## 2023-02-03 ENCOUNTER — Other Ambulatory Visit: Payer: Self-pay

## 2023-02-03 ENCOUNTER — Ambulatory Visit
Admission: RE | Admit: 2023-02-03 | Discharge: 2023-02-03 | Disposition: A | Discharge status: Home / Self Care | Payer: Medicare Other | Source: Ambulatory Visit | Attending: Radiation Oncology | Admitting: Radiation Oncology

## 2023-02-03 DIAGNOSIS — C61 Malignant neoplasm of prostate: Secondary | ICD-10-CM | POA: Diagnosis not present

## 2023-02-03 LAB — RAD ONC ARIA SESSION SUMMARY
Course Elapsed Days: 36
Plan Fractions Treated to Date: 26
Plan Prescribed Dose Per Fraction: 2.5 Gy
Plan Total Fractions Prescribed: 28
Plan Total Prescribed Dose: 70 Gy
Reference Point Dosage Given to Date: 65 Gy
Reference Point Session Dosage Given: 2.5 Gy
Session Number: 26

## 2023-02-04 ENCOUNTER — Other Ambulatory Visit: Payer: Self-pay

## 2023-02-04 ENCOUNTER — Ambulatory Visit: Payer: Medicare Other

## 2023-02-04 ENCOUNTER — Ambulatory Visit
Admission: RE | Admit: 2023-02-04 | Discharge: 2023-02-04 | Disposition: A | Discharge status: Home / Self Care | Payer: Medicare Other | Source: Ambulatory Visit | Attending: Radiation Oncology | Admitting: Radiation Oncology

## 2023-02-04 DIAGNOSIS — C61 Malignant neoplasm of prostate: Secondary | ICD-10-CM | POA: Diagnosis not present

## 2023-02-04 LAB — RAD ONC ARIA SESSION SUMMARY
Course Elapsed Days: 37
Plan Fractions Treated to Date: 27
Plan Prescribed Dose Per Fraction: 2.5 Gy
Plan Total Fractions Prescribed: 28
Plan Total Prescribed Dose: 70 Gy
Reference Point Dosage Given to Date: 67.5 Gy
Reference Point Session Dosage Given: 2.5 Gy
Session Number: 27

## 2023-02-05 ENCOUNTER — Other Ambulatory Visit: Payer: Self-pay

## 2023-02-05 ENCOUNTER — Ambulatory Visit: Admission: RE | Admit: 2023-02-05 | Payer: Medicare Other | Source: Ambulatory Visit

## 2023-02-05 ENCOUNTER — Ambulatory Visit
Admission: RE | Admit: 2023-02-05 | Discharge: 2023-02-05 | Disposition: A | Discharge status: Home / Self Care | Payer: Medicare Other | Source: Ambulatory Visit | Attending: Radiation Oncology | Admitting: Radiation Oncology

## 2023-02-05 DIAGNOSIS — C61 Malignant neoplasm of prostate: Secondary | ICD-10-CM | POA: Diagnosis not present

## 2023-02-05 LAB — RAD ONC ARIA SESSION SUMMARY
Course Elapsed Days: 38
Plan Fractions Treated to Date: 28
Plan Prescribed Dose Per Fraction: 2.5 Gy
Plan Total Fractions Prescribed: 28
Plan Total Prescribed Dose: 70 Gy
Reference Point Dosage Given to Date: 70 Gy
Reference Point Session Dosage Given: 2.5 Gy
Session Number: 28

## 2023-02-08 NOTE — Radiation Completion Notes (Addendum)
  Radiation Oncology         [phone] ________________________________  Name: Peter Roberts MRN: [id number]  Date: 02/05/2023  DOB: 1950-10-10  Referring Physician: Karoline Caldwell, M.D. Date of Service: 2023-02-08 Radiation Oncologist: Margaretmary Bayley, M.D. [facility]  RADIATION ONCOLOGY END OF TREATMENT NOTE     Diagnosis: 72 y.o. gentleman with stage T1c adenocarcinoma of the prostate with a Gleason's score of 4+3 and a PSA of 6.35   Intent: Curative     ==========DELIVERED PLANS==========  First Treatment Date: 2022-12-29 - Last Treatment Date: 2023-02-05   Plan Name: Prostate Site: Prostate Technique: IMRT Mode: Photon Dose Per Fraction: 2.5 Gy Prescribed Dose (Delivered / Prescribed): 70 Gy / 70 Gy Prescribed Fxs (Delivered / Prescribed): 28 / 28     ==========ON TREATMENT VISIT DATES========== 2023-01-01, 2023-01-08, 2023-01-15, 2023-01-22, 2023-01-29, 2023-02-05   See weekly On Treatment Notes in Epic for details.  He tolerated the radiation treatments relatively well with mild urinary symptoms and modest fatigue.  He reported increased nocturia which was managed with Rapaflo daily.  The patient will receive a call in about one month from the radiation oncology department. He will continue follow up with his urologist, Dr. Jennette Bill, as well.  ------------------------------------------------   Margaretmary Dys, MD [facility]  Radiation Oncology Direct Dial: [phone]  Fax: [phone] [email or web address]  Skype  LinkedIn

## 2023-02-14 ENCOUNTER — Other Ambulatory Visit (HOSPITAL_COMMUNITY): Payer: Self-pay | Admitting: Internal Medicine

## 2023-02-16 ENCOUNTER — Ambulatory Visit (HOSPITAL_COMMUNITY)
Admission: RE | Admit: 2023-02-16 | Discharge: 2023-02-16 | Disposition: A | Discharge status: Home / Self Care | Payer: Medicare Other | Source: Ambulatory Visit | Attending: Vascular Surgery | Admitting: Vascular Surgery

## 2023-02-16 ENCOUNTER — Encounter: Payer: Self-pay | Admitting: Physician Assistant

## 2023-02-16 ENCOUNTER — Ambulatory Visit: Payer: Medicare Other | Admitting: Physician Assistant

## 2023-02-16 VITALS — BP 167/95 | HR 83 | Temp 98.4°F | Resp 18 | Ht 67.0 in | Wt 183.7 lb

## 2023-02-16 DIAGNOSIS — I6529 Occlusion and stenosis of unspecified carotid artery: Secondary | ICD-10-CM | POA: Diagnosis present

## 2023-02-16 NOTE — Progress Notes (Signed)
Office Note     CC:  follow up Requesting Provider:  Alyson Ingles, PA*  HPI: Peter Roberts is a 72 y.o. (1951/01/17) male who presents for surveillance of carotid artery stenosis.  He underwent left carotid endarterectomy by Dr. Chestine Spore on 02/25/2022 due to asymptomatic high-grade stenosis.  He was diagnosed with a TIA since last office visit however this was related to atrial fibrillation.  He is currently without any strokelike symptoms including slurring speech, changes in vision, or one-sided weakness.  He is on a statin daily.  He is also on Eliquis for atrial fibrillation.  It should also be noted that he recently completed radiation treatment for prostate cancer.   Past Medical History:  Diagnosis Date   Arthritis    Carotid stenosis, asymptomatic, bilateral 2017   followed by pcp and vascular dr Chestine Spore;    s/p left CEA 02-25-2022 for stenosis >80%; (post op note in epic 03-17-2022 healing well)   last duplex in epic 02-03-2022 right ICA 40-59% and left ICA 80-99%   Elevated blood pressure reading without diagnosis of hypertension    Family history of prostate cancer    History of anal fissures    Hyperlipidemia    Malignant neoplasm prostate (HCC) 04/2022   urologist--- dr newsome/  radiation oncologist--- dr Kathrynn Running;  dx 11/ 2023,  Gleason 4+3   Microcytic anemia    Right carpal tunnel syndrome     Past Surgical History:  Procedure Laterality Date   BIOPSY  10/21/2022   Procedure: BIOPSY;  Surgeon: Lynann Bologna, DO;  Location: [facility] ENDOSCOPY;  Service: Gastroenterology;;   CARPAL TUNNEL RELEASE Left 12/2021   COLONOSCOPY  2019   ENDARTERECTOMY Left 02/25/2022   Procedure: LEFT CAROTID ENDARTERECTOMY;  Surgeon: Cephus Shelling, MD;  Location: [facility] OR;  Service: Vascular;  Laterality: Left;   FLEXIBLE SIGMOIDOSCOPY N/A 10/21/2022   Procedure: FLEXIBLE SIGMOIDOSCOPY;  Surgeon: Lynann Bologna, DO;  Location: [facility] ENDOSCOPY;  Service: Gastroenterology;  Laterality: N/A;    GOLD SEED IMPLANT N/A 12/15/2022   Procedure: GOLD SEED IMPLANT;  Surgeon: Belva Agee, MD;  Location: [facility];  Service: Urology;  Laterality: N/A;  30 MINS   KNEE ARTHROSCOPY Right 2002   SPACE OAR INSTILLATION N/A 12/15/2022   Procedure: SPACE OAR INSTILLATION;  Surgeon: Belva Agee, MD;  Location: [facility];  Service: Urology;  Laterality: N/A;    Social History   Socioeconomic History   Marital status: Widowed    Spouse name: Not on file   Number of children: 3   Years of education: Not on file   Highest education level: Not on file  Occupational History   Occupation: Retired  Tobacco Use   Smoking status: Never    Passive exposure: Never   Smokeless tobacco: Never  Vaping Use   Vaping status: Never Used  Substance and Sexual Activity   Alcohol use: No   Drug use: Never   Sexual activity: Not Currently  Other Topics Concern   Not on file  Social History Narrative   Not on file   Social Determinants of Health   Financial Resource Strain: Not on file  Food Insecurity: No Food Insecurity (10/19/2022)   Hunger Vital Sign    Worried About Running Out of Food in the Last Year: Never true    Ran Out of Food in the Last Year: Never true  Transportation Needs: No Transportation Needs (10/19/2022)   PRAPARE - Transportation  Lack of Transportation (Medical): No    Lack of Transportation (Non-Medical): No  Physical Activity: Not on file  Stress: Not on file  Social Connections: Not on file  Intimate Partner Violence: Not At Risk (10/19/2022)   Humiliation, Afraid, Rape, and Kick questionnaire    Fear of Current or Ex-Partner: No    Emotionally Abused: No    Physically Abused: No    Sexually Abused: No    Family History  Problem Relation Age of Onset   Lung disease Mother    Cancer Mother 32 - 21       gyn cancer   Heart attack Father    Heart disease Father    Breast cancer Sister 52   Prostate cancer Brother 25 -  58       negative genetic testing   Prostate cancer Brother 38 - 58   Cancer Maternal Grandfather        mouth cancer    Current Outpatient Medications  Medication Sig Dispense Refill   Cholecalciferol (VITAMIN D3) 125 MCG (5000 UT) capsule Take 5,000 Units by mouth daily.     diclofenac Sodium (VOLTAREN) 1 % GEL Apply 4 g topically 4 (four) times daily as needed (pain).     Ferrous Sulfate (IRON) 90 (18 Fe) MG TABS Take 90 mg by mouth every evening.     Magnesium 500 MG CAPS Take 1 capsule by mouth every evening.     Oil of Oregano 1500 MG CAPS Take 1 capsule by mouth daily.     OVER THE COUNTER MEDICATION Take 3 capsules by mouth daily after lunch. Prostagenix     OVER THE COUNTER MEDICATION Blood booster 2 tabs daily     Probiotic Product (PROBIOTIC DAILY) CAPS Take 1 capsule by mouth daily.     rosuvastatin (CRESTOR) 20 MG tablet Take 1 tablet (20 mg total) by mouth every evening. 90 tablet 0   silodosin (RAPAFLO) 8 MG CAPS capsule Take 1 capsule (8 mg total) by mouth daily after supper. 30 capsule 2   apixaban (ELIQUIS) 5 MG TABS tablet Take 1 tablet (5 mg total) by mouth 2 (two) times daily. 60 tablet 3   metoprolol succinate (TOPROL-XL) 25 MG 24 hr tablet Take 1 tablet (25 mg total) by mouth every evening. 90 tablet 2   No current facility-administered medications for this visit.    Allergies  Allergen Reactions   Sulfa Antibiotics Hives     REVIEW OF SYSTEMS:   [X]  denotes positive finding, [ ]  denotes negative finding Cardiac  Comments:  Chest pain or chest pressure:    Shortness of breath upon exertion:    Short of breath when lying flat:    Irregular heart rhythm:        Vascular    Pain in calf, thigh, or hip brought on by ambulation:    Pain in feet at night that wakes you up from your sleep:     Blood clot in your veins:    Leg swelling:         Pulmonary    Oxygen at home:    Productive cough:     Wheezing:         Neurologic    Sudden weakness in  arms or legs:     Sudden numbness in arms or legs:     Sudden onset of difficulty speaking or slurred speech:    Temporary loss of vision in one eye:     Problems with dizziness:  Gastrointestinal    Blood in stool:     Vomited blood:         Genitourinary    Burning when urinating:     Blood in urine:        Psychiatric    Major depression:         Hematologic    Bleeding problems:    Problems with blood clotting too easily:        Skin    Rashes or ulcers:        Constitutional    Fever or chills:      PHYSICAL EXAMINATION:  Vitals:   02/16/23 1458 02/16/23 1459  BP: (!) 178/98 (!) 167/95  Pulse: 83   Resp: 18   Temp: 98.4 F (36.9 C)   TempSrc: Temporal   SpO2: 95%   Weight: 183 lb 11.2 oz (83.3 kg)   Height: 5\' 7"  (1.702 m)     General:  WDWN in NAD; vital signs documented above Gait: Not observed HENT: WNL, normocephalic Pulmonary: normal non-labored breathing , without Rales, rhonchi,  wheezing Cardiac: regular HR Abdomen: soft, NT, no masses Skin: without rashes Vascular Exam/Pulses: symmetrical radial pulses Extremities: without ischemic changes, without Gangrene , without cellulitis; without open wounds;  Musculoskeletal: no muscle wasting or atrophy  Neurologic: A&O X 3; CN grossly intact Psychiatric:  The pt has Normal affect.   Non-Invasive Vascular Imaging:   Right ICA 1 to 39% stenosis Left carotid endarterectomy site widely patent without any hemodynamically significant stenosis    ASSESSMENT/PLAN:: 72 y.o. male here for follow up for carotid artery stenosis  -Subjectively he is without any strokelike symptoms including slurring speech, changes in vision, or one-sided weakness.  He was diagnosed with a TIA since last office visit 6 months ago however this was related to atrial fibrillation per the patient.  He is now on Eliquis and follows regularly with his cardiologist.  Carotid duplex performed today demonstrates a widely  patent endarterectomy site in the left ICA.  Right ICA has only mild stenosis estimated to be 1 to 39%.  He will continue with statin daily.  We will repeat carotid duplex in 1 year.   Emilie Rutter, PA-C [facility] [phone]  Clinic MD:   Chestine Spore

## 2023-03-01 NOTE — Progress Notes (Signed)
Patient was presented to the [facility] on 11/17/22 for his  stage T1c adenocarcinoma of the prostate with a Gleason's score of 4+3 and a PSA of 6.35.  Patient proceed with treatment recommendations of 5.5 weeks of external beam therapy and had his final radiation treatment on 02/05/23.   Patient is scheduled for a post treatment nurse call on 03/23/23 and has a follow up with urology on 05/17/23 at 9:30am with Dr. Jennette Bill.   RN spoke with patient and provided information about follow up and educated on PSA monitoring post treatment.  All questions answered.  No additional needs at this time.

## 2023-03-04 ENCOUNTER — Other Ambulatory Visit: Payer: Self-pay | Admitting: Urology

## 2023-03-04 DIAGNOSIS — C61 Malignant neoplasm of prostate: Secondary | ICD-10-CM

## 2023-03-05 ENCOUNTER — Other Ambulatory Visit: Payer: Self-pay

## 2023-03-05 DIAGNOSIS — I6529 Occlusion and stenosis of unspecified carotid artery: Secondary | ICD-10-CM

## 2023-03-19 ENCOUNTER — Encounter: Payer: Self-pay | Admitting: *Deleted

## 2023-03-22 ENCOUNTER — Encounter: Payer: Self-pay | Admitting: *Deleted

## 2023-03-22 ENCOUNTER — Other Ambulatory Visit (HOSPITAL_COMMUNITY): Payer: Self-pay

## 2023-03-22 MED ORDER — APIXABAN 5 MG PO TABS: 5.0000 mg | ORAL_TABLET | Freq: Two times a day (BID) | ORAL | 1 refills | Status: DC

## 2023-03-23 ENCOUNTER — Ambulatory Visit
Admission: RE | Admit: 2023-03-23 | Discharge: 2023-03-23 | Disposition: A | Discharge status: Home / Self Care | Payer: Medicare Other | Source: Ambulatory Visit | Attending: Radiation Oncology | Admitting: Radiation Oncology

## 2023-03-23 NOTE — Progress Notes (Signed)
Radiation Oncology         [phone] ________________________________  Name: Peter Roberts MRN: [id number]  Date of Service: 03/23/2023  DOB: Mar 20, 1951  Post Treatment Telephone Note  Diagnosis:  stage T1c adenocarcinoma of the prostate with a Gleason's score of 4+3 and a PSA of 6.35 (as documented in provider EOT note)  Pre Treatment IPSS Score: 10 (as documented in the provider consult note)  The patient was not available for call today.   Patient has a scheduled follow up visit with his urologist, Dr. Jennette Bill, for ongoing surveillance. He was counseled that PSA levels will be drawn in the urology office, and was reassured that additional time is expected to improve bowel and bladder symptoms. He was encouraged to call back with concerns or questions regarding radiation.    Ruel Favors, LPN

## 2023-04-13 ENCOUNTER — Encounter: Payer: Self-pay | Admitting: *Deleted

## 2023-04-13 ENCOUNTER — Inpatient Hospital Stay: Payer: Medicare Other | Attending: Nurse Practitioner | Admitting: *Deleted

## 2023-04-13 DIAGNOSIS — C61 Malignant neoplasm of prostate: Secondary | ICD-10-CM

## 2023-04-13 NOTE — Progress Notes (Signed)
SCP reviewed and completed. Pt will have post-treatment PSA labs at [facility] and appt with Dr. Jennette Bill in November.Last colonoscopy was in 2020.

## 2023-07-08 ENCOUNTER — Ambulatory Visit (HOSPITAL_COMMUNITY)
Admission: RE | Admit: 2023-07-08 | Discharge: 2023-07-08 | Disposition: A | Discharge status: Home / Self Care | Payer: Medicare Other | Source: Ambulatory Visit | Attending: Internal Medicine | Admitting: Internal Medicine

## 2023-07-08 VITALS — BP 126/64 | HR 64 | Ht 67.0 in | Wt 190.2 lb

## 2023-07-08 DIAGNOSIS — Z7901 Long term (current) use of anticoagulants: Secondary | ICD-10-CM | POA: Diagnosis not present

## 2023-07-08 DIAGNOSIS — E785 Hyperlipidemia, unspecified: Secondary | ICD-10-CM | POA: Diagnosis not present

## 2023-07-08 DIAGNOSIS — D6869 Other thrombophilia: Secondary | ICD-10-CM | POA: Diagnosis not present

## 2023-07-08 DIAGNOSIS — N1831 Chronic kidney disease, stage 3a: Secondary | ICD-10-CM | POA: Diagnosis not present

## 2023-07-08 DIAGNOSIS — E669 Obesity, unspecified: Secondary | ICD-10-CM | POA: Insufficient documentation

## 2023-07-08 DIAGNOSIS — Z6829 Body mass index (BMI) 29.0-29.9, adult: Secondary | ICD-10-CM | POA: Diagnosis not present

## 2023-07-08 DIAGNOSIS — Z79899 Other long term (current) drug therapy: Secondary | ICD-10-CM | POA: Insufficient documentation

## 2023-07-08 DIAGNOSIS — I4891 Unspecified atrial fibrillation: Secondary | ICD-10-CM | POA: Diagnosis not present

## 2023-07-08 DIAGNOSIS — Z7182 Exercise counseling: Secondary | ICD-10-CM | POA: Insufficient documentation

## 2023-07-08 DIAGNOSIS — I480 Paroxysmal atrial fibrillation: Secondary | ICD-10-CM | POA: Diagnosis present

## 2023-07-08 DIAGNOSIS — I129 Hypertensive chronic kidney disease with stage 1 through stage 4 chronic kidney disease, or unspecified chronic kidney disease: Secondary | ICD-10-CM | POA: Insufficient documentation

## 2023-07-08 NOTE — Progress Notes (Signed)
Primary Care Physician: Alyson Ingles, PA-C (Inactive) Primary Cardiologist: None Primary Electrophysiologist: None Referring Physician: Dr. Charlett Nose Peter Roberts is a 73 y.o. male with a history of HTN, CAD, HLD, stage IIIa CKD, carotid artery stenosis s/p left CEA 9/23, prostate cancer, and atrial fibrillation who presents for consultation in the [facility]. The patient was admitted from 4/29-10/21/22 with initial symptoms of chest pain and dizziness. He was scheduled to have sigmoidoscopy on day of presentation due to rectal bleeding noted in March. When EMS arrived, he had difficulty speaking and facial droop but both resolved upon arrival to ED. He arrived in ED in atrial fibrillation with RVR. CT showed possible acute to subacute infarct in the left corona radiata. Neurology ordered MRI which was negative for stroke. Sigmoidoscopy performed during admission to avoid being off anticoagulation. Coronary CTA with coronary calcium score of 585. He converted to NSR on 4/30 and began Toprol 25 mg daily on 5/1; not on cardizem given underlying CAD. He was discharged on Eliquis 5 mg BID. Held dose of Eliquis night of 5/1 and advised to resume 5/2 if no rectal bleeding. Chest pain thought to be secondary to demand ischemia. Patient is on Eliquis 5 mg BID for a CHADS2VASC score of 5.  On evaluation today, he is currently in SR. As noted above, he felt chest pain and dizziness when he was in Afib. He felt rapid heart rate when he was in the ED. He feels well overall at this time; still not sure what caused him to have Afib. He denies any current chest pain or shortness of breath. He is compliant with Toprol 25 mg qhs.   He is compliant with anticoagulation and has not missed any doses. He has no bleeding concerns.  He does not drink alcohol or caffeinated beverages. He does not believe he snores or stops breathing at night.   On follow up 01/06/23, he is currently in  NSR. He is undergoing radiation therapy for prostate cancer. He has not had any episodes of Afib since last office visit. He has no bleeding issues with anticoagulation.  On follow up 07/08/23, he is currently in NSR. He has had no episodes of Afib since last office visit. He completed radiation therapy for prostate cancer 01/2023. No bleeding issues on Eliquis 5 mg BID.   Today, he denies symptoms of palpitations, chest pain, shortness of breath, orthopnea, PND, lower extremity edema, dizziness, presyncope, syncope, snoring, daytime somnolence, bleeding, or neurologic sequela. The patient is tolerating medications without difficulties and is otherwise without complaint today.   Atrial Fibrillation Risk Factors:  he does not have symptoms or diagnosis of sleep apnea. he does not have a history of rheumatic fever. he does not have a history of alcohol use. The patient does not have a history of early familial atrial fibrillation or other arrhythmias.  he has a BMI of Body mass index is 29.79 kg/m.Marland Kitchen Filed Weights   07/08/23 1339  Weight: 86.3 kg      Family History  Problem Relation Age of Onset   Lung disease Mother    Cancer Mother 64 - 54       gyn cancer   Heart attack Father    Heart disease Father    Breast cancer Sister 70   Prostate cancer Brother 26 - 71       negative genetic testing   Prostate cancer Brother 70 - 42   Cancer Maternal Grandfather  mouth cancer    Atrial Fibrillation Management history:  Previous antiarrhythmic drugs: None Previous cardioversions: None Previous ablations: None Anticoagulation history: Eliquis 5 mg BID   Past Medical History:  Diagnosis Date   Arthritis    Carotid stenosis, asymptomatic, bilateral 2017   followed by pcp and vascular dr Chestine Spore;    s/p left CEA 02-25-2022 for stenosis >80%; (post op note in epic 03-17-2022 healing well)   last duplex in epic 02-03-2022 right ICA 40-59% and left ICA 80-99%   Elevated blood  pressure reading without diagnosis of hypertension    Family history of prostate cancer    History of anal fissures    Hyperlipidemia    Malignant neoplasm prostate (HCC) 04/2022   urologist--- dr newsome/  radiation oncologist--- dr Kathrynn Running;  dx 11/ 2023,  Gleason 4+3   Microcytic anemia    Right carpal tunnel syndrome    Past Surgical History:  Procedure Laterality Date   BIOPSY  10/21/2022   Procedure: BIOPSY;  Surgeon: Lynann Bologna, DO;  Location: [facility] ENDOSCOPY;  Service: Gastroenterology;;   CARPAL TUNNEL RELEASE Left 12/2021   COLONOSCOPY  2019   ENDARTERECTOMY Left 02/25/2022   Procedure: LEFT CAROTID ENDARTERECTOMY;  Surgeon: Cephus Shelling, MD;  Location: [facility] OR;  Service: Vascular;  Laterality: Left;   FLEXIBLE SIGMOIDOSCOPY N/A 10/21/2022   Procedure: FLEXIBLE SIGMOIDOSCOPY;  Surgeon: Lynann Bologna, DO;  Location: [facility] ENDOSCOPY;  Service: Gastroenterology;  Laterality: N/A;   GOLD SEED IMPLANT N/A 12/15/2022   Procedure: GOLD SEED IMPLANT;  Surgeon: Belva Agee, MD;  Location: [facility];  Service: Urology;  Laterality: N/A;  30 MINS   KNEE ARTHROSCOPY Right 2002   SPACE OAR INSTILLATION N/A 12/15/2022   Procedure: SPACE OAR INSTILLATION;  Surgeon: Belva Agee, MD;  Location: [facility];  Service: Urology;  Laterality: N/A;    Current Outpatient Medications  Medication Sig Dispense Refill   apixaban (ELIQUIS) 5 MG TABS tablet Take 1 tablet (5 mg total) by mouth 2 (two) times daily. 180 tablet 1   Ascorbic Acid (VITAMIN C) 1000 MG tablet Take 1,000 mg by mouth 2 (two) times daily.     Cholecalciferol (VITAMIN D3) 125 MCG (5000 UT) capsule Take 5,000 Units by mouth daily.     diclofenac Sodium (VOLTAREN) 1 % GEL Apply 4 g topically 4 (four) times daily as needed (pain).     Ferrous Sulfate (IRON) 90 (18 Fe) MG TABS Take 90 mg by mouth every evening.     Magnesium 500 MG CAPS Take 1 capsule by mouth every evening.      metoprolol succinate (TOPROL-XL) 25 MG 24 hr tablet Take 1 tablet (25 mg total) by mouth every evening. 90 tablet 2   Oil of Oregano 1500 MG CAPS Take 1 capsule by mouth daily.     OVER THE COUNTER MEDICATION Take 3 capsules by mouth daily after lunch. Prostagenix     OVER THE COUNTER MEDICATION Blood booster 2 tabs daily     Probiotic Product (PROBIOTIC DAILY) CAPS Take 1 capsule by mouth daily.     rosuvastatin (CRESTOR) 20 MG tablet Take 1 tablet (20 mg total) by mouth every evening. 90 tablet 0   No current facility-administered medications for this encounter.    Allergies  Allergen Reactions   Sulfa Antibiotics Hives   ROS- All systems are reviewed and negative except as per the HPI above.  Physical Exam: Vitals:   07/08/23 1339  BP: 126/64  Pulse: 64  Weight: 86.3 kg  Height: 5\' 7"  (1.702 m)    GEN- The patient is well appearing, alert and oriented x 3 today.   Neck - no JVD or carotid bruit noted Lungs- Clear to ausculation bilaterally, normal work of breathing Heart- Regular rate and rhythm, no murmurs, rubs or gallops, PMI not laterally displaced Extremities- no clubbing, cyanosis, or edema Skin - no rash or ecchymosis noted   Wt Readings from Last 3 Encounters:  07/08/23 86.3 kg  02/16/23 83.3 kg  01/06/23 82.4 kg    EKG today demonstrates  Vent. rate 64 BPM PR interval 142 ms QRS duration 82 ms QT/QTcB 388/400 ms P-R-T axes 57 46 21 Normal sinus rhythm Normal ECG When compared with ECG of 06-Jan-2023 13:41, PREVIOUS ECG IS PRESENT  Echo 10/20/22 demonstrated:  1. Left ventricular ejection fraction, by estimation, is 60 to 65%. The  left ventricle has normal function. The left ventricle has no regional  wall motion abnormalities. Left ventricular diastolic parameters were  normal.   2. Right ventricular systolic function is normal. The right ventricular  size is normal.   3. The mitral valve is normal in structure. Trivial mitral valve   regurgitation. No evidence of mitral stenosis.   4. The aortic valve was not well visualized. Aortic valve regurgitation  is not visualized. No aortic stenosis is present.   5. The inferior vena cava is normal in size with greater than 50%  respiratory variability, suggesting right atrial pressure of 3 mmHg.  Epic records are reviewed at length today.  CHA2DS2-VASc Score = 5  The patient's score is based upon: CHF History: 0 HTN History: 1 Diabetes History: 0 Stroke History: 2 Vascular Disease History: 1 Age Score: 1 Gender Score: 0      ASSESSMENT AND PLAN: Paroxysmal Atrial Fibrillation (ICD10:  I48.0) The patient's CHA2DS2-VASc score is 5, indicating a 7.2% annual risk of stroke.    He is currently in NSR. Continue conservative observation. He uses a Kardiamobile device to check his rhythm. Continue Toprol 25 mg at bedtime.    2. Secondary Hypercoagulable State (ICD10:  D68.69) The patient is at significant risk for stroke/thromboembolism based upon his CHA2DS2-VASc Score of 5.  Continue Apixaban (Eliquis).   No missed doses.  3. Obesity Body mass index is 29.79 kg/m. Lifestyle modification was discussed at length including regular exercise and weight reduction. Continue with daily activity as tolerated.    Follow up 1 year Afib clinic.   Lake Bells, PA-C Afib Clinic [facility] [address] [place], [place] [address] [phone] 07/08/2023 2:03 PM

## 2023-09-11 ENCOUNTER — Other Ambulatory Visit (HOSPITAL_COMMUNITY): Payer: Self-pay | Admitting: Internal Medicine

## 2023-11-08 ENCOUNTER — Other Ambulatory Visit (HOSPITAL_COMMUNITY): Payer: Self-pay | Admitting: Internal Medicine

## 2024-03-21 ENCOUNTER — Ambulatory Visit

## 2024-03-21 ENCOUNTER — Encounter (HOSPITAL_COMMUNITY)

## 2024-03-22 ENCOUNTER — Other Ambulatory Visit: Payer: Self-pay | Admitting: Vascular Surgery

## 2024-03-22 DIAGNOSIS — I6529 Occlusion and stenosis of unspecified carotid artery: Secondary | ICD-10-CM

## 2024-04-23 NOTE — Progress Notes (Unsigned)
 HISTORY AND PHYSICAL     CC:  follow up. Requesting Provider:  No ref. provider found  HPI: This is a 73 y.o. male here for follow up for carotid artery stenosis.  Pt is s/p left CEA for asymptomatic carotid artery stenosis on 02/25/2022 by Dr. Gretta.    Pt was last seen 02/16/2023 and at that time he had since had a TIA that was felt to be related to afib.  He had also recently completed radiation for prostate cancer.   Pt returns today for follow up.    Pt denies any amaurosis fugax, speech difficulties, weakness, numbness, paralysis or clumsiness or facial droop.    He denies any claudication, rest pain or non healing wounds.   The pt is on a statin for cholesterol management.  The pt is not on a daily aspirin .   Other AC:  Eliquis  The pt is on BB for hypertension.   The pt is not on medication for diabetes Tobacco hx:  never  Pt does not have family hx of AAA.  Past Medical History:  Diagnosis Date   Arthritis    Carotid stenosis, asymptomatic, bilateral 2017   followed by pcp and vascular dr gretta;    s/p left CEA 02-25-2022 for stenosis >80%; (post op note in epic 03-17-2022 healing well)   last duplex in epic 02-03-2022 right ICA 40-59% and left ICA 80-99%   Elevated blood pressure reading without diagnosis of hypertension    Family history of prostate cancer    History of anal fissures    Hyperlipidemia    Malignant neoplasm prostate (HCC) 04/2022   urologist--- dr newsome/  radiation oncologist--- dr patrcia;  dx 11/ 2023,  Gleason 4+3   Microcytic anemia    Right carpal tunnel syndrome     Past Surgical History:  Procedure Laterality Date   BIOPSY  10/21/2022   Procedure: BIOPSY;  Surgeon: Kriss Estefana DEL, DO;  Location: [facility] ENDOSCOPY;  Service: Gastroenterology;;   CARPAL TUNNEL RELEASE Left 12/2021   COLONOSCOPY  2019   ENDARTERECTOMY Left 02/25/2022   Procedure: LEFT CAROTID ENDARTERECTOMY;  Surgeon: Gretta Lonni PARAS, MD;  Location: [facility] OR;  Service:  Vascular;  Laterality: Left;   FLEXIBLE SIGMOIDOSCOPY N/A 10/21/2022   Procedure: FLEXIBLE SIGMOIDOSCOPY;  Surgeon: Kriss Estefana DEL, DO;  Location: [facility] ENDOSCOPY;  Service: Gastroenterology;  Laterality: N/A;   GOLD SEED IMPLANT N/A 12/15/2022   Procedure: GOLD SEED IMPLANT;  Surgeon: Rosalind Zachary NOVAK, MD;  Location: [facility];  Service: Urology;  Laterality: N/A;  30 MINS   KNEE ARTHROSCOPY Right 2002   SPACE OAR INSTILLATION N/A 12/15/2022   Procedure: SPACE OAR INSTILLATION;  Surgeon: Rosalind Zachary NOVAK, MD;  Location: [facility];  Service: Urology;  Laterality: N/A;    Allergies  Allergen Reactions   Sulfa Antibiotics Hives    Current Outpatient Medications  Medication Sig Dispense Refill   apixaban  (ELIQUIS ) 5 MG TABS tablet TAKE 1 TABLET BY MOUTH TWICE A DAY 180 tablet 3   Ascorbic Acid (VITAMIN C) 1000 MG tablet Take 1,000 mg by mouth 2 (two) times daily.     Cholecalciferol  (VITAMIN D3) 125 MCG (5000 UT) capsule Take 5,000 Units by mouth daily.     diclofenac Sodium (VOLTAREN) 1 % GEL Apply 4 g topically 4 (four) times daily as needed (pain).     Ferrous Sulfate  (IRON) 90 (18 Fe) MG TABS Take 90 mg by mouth every evening.     Magnesium  500  MG CAPS Take 1 capsule by mouth every evening.     metoprolol  succinate (TOPROL -XL) 25 MG 24 hr tablet TAKE 1 TABLET BY MOUTH EVERY EVENING 90 tablet 2   Oil of Oregano 1500 MG CAPS Take 1 capsule by mouth daily.     OVER THE COUNTER MEDICATION Take 3 capsules by mouth daily after lunch. Prostagenix     OVER THE COUNTER MEDICATION Blood booster 2 tabs daily     Probiotic Product (PROBIOTIC DAILY) CAPS Take 1 capsule by mouth daily.     rosuvastatin  (CRESTOR ) 20 MG tablet Take 1 tablet (20 mg total) by mouth every evening. 90 tablet 0   No current facility-administered medications for this visit.    Family History  Problem Relation Age of Onset   Lung disease Mother    Cancer Mother 16 - 22       gyn cancer    Heart attack Father    Heart disease Father    Breast cancer Sister 2   Prostate cancer Brother 63 - 42       negative genetic testing   Prostate cancer Brother 45 - 56   Cancer Maternal Grandfather        mouth cancer    Social History   Socioeconomic History   Marital status: Widowed    Spouse name: Not on file   Number of children: 3   Years of education: Not on file   Highest education level: Not on file  Occupational History   Occupation: Retired  Tobacco Use   Smoking status: Never    Passive exposure: Never   Smokeless tobacco: Never  Vaping Use   Vaping status: Never Used  Substance and Sexual Activity   Alcohol use: No   Drug use: Never   Sexual activity: Not Currently  Other Topics Concern   Not on file  Social History Narrative   Not on file   Social Drivers of Health   Financial Resource Strain: Not on file  Food Insecurity: No Food Insecurity (10/19/2022)   Hunger Vital Sign    Worried About Running Out of Food in the Last Year: Never true    Ran Out of Food in the Last Year: Never true  Transportation Needs: No Transportation Needs (10/19/2022)   PRAPARE - Administrator, Civil Service (Medical): No    Lack of Transportation (Non-Medical): No  Physical Activity: Not on file  Stress: Not on file  Social Connections: Not on file  Intimate Partner Violence: Not At Risk (10/19/2022)   Humiliation, Afraid, Rape, and Kick questionnaire    Fear of Current or Ex-Partner: No    Emotionally Abused: No    Physically Abused: No    Sexually Abused: No     REVIEW OF SYSTEMS:   [X]  denotes positive finding, [ ]  denotes negative finding Cardiac  Comments:  Chest pain or chest pressure:    Shortness of breath upon exertion:    Short of breath when lying flat:    Irregular heart rhythm:        Vascular    Pain in calf, thigh, or hip brought on by ambulation:    Pain in feet at night that wakes you up from your sleep:     Blood clot in your  veins:    Leg swelling:         Pulmonary    Oxygen at home:    Productive cough:     Wheezing:  Neurologic    Sudden weakness in arms or legs:     Sudden numbness in arms or legs:     Sudden onset of difficulty speaking or slurred speech:    Temporary loss of vision in one eye:     Problems with dizziness:         Gastrointestinal    Blood in stool:     Vomited blood:         Genitourinary    Burning when urinating:     Blood in urine:        Psychiatric    Major depression:         Hematologic    Bleeding problems:    Problems with blood clotting too easily:        Skin    Rashes or ulcers:        Constitutional    Fever or chills:      PHYSICAL EXAMINATION:  Today's Vitals   04/25/24 0854 04/25/24 0857  BP: (!) 165/74 (!) 161/82  Pulse: 66 61  Temp: 98 F (36.7 C)   TempSrc: Temporal   Weight: 193 lb 11.2 oz (87.9 kg)   PainSc: 0-No pain    Body mass index is 30.34 kg/m.   General:  WDWN in NAD; vital signs documented above Gait: Not observed HENT: WNL, normocephalic Pulmonary: normal non-labored breathing Cardiac: regular HR, without carotid bruits Abdomen: soft, NT; aortic pulse is not palpable Skin: without rashes Vascular Exam/Pulses:  Right Left  Radial 2+ (normal) 2+ (normal)  DP 2+ (normal) 2+ (normal)  PT 2+ (normal) 1+ (weak)   Extremities: without open wounds Musculoskeletal: no muscle wasting or atrophy  Neurologic: A&O X 3; moving all extremities equally; speech is fluent/normal Psychiatric:  The pt has Normal affect.   Non-Invasive Vascular Imaging:   Carotid Duplex on 04/25/2024 Right:  1-39% ICA stenosis Left:  1-39% ICA stenosis Vertebrals:  Bilateral vertebral arteries demonstrate antegrade flow. Left  vertebral artery appears stenotic mid-cervical.  Subclavians: Normal flow hemodynamics were seen in bilateral subclavian arteries.   Previous Carotid duplex on 02/16/2023: Right: 1-39% ICA stenosis Left:   patent  left CEA site without restenosis    ASSESSMENT/PLAN:: 73 y.o. male here for follow up carotid artery stenosis and has hx of left CEA for asymptomatic carotid artery stenosis on 02/25/2022 by Dr. Gretta.  -duplex today reveals 1-39% bilateral ICA stenosis -discussed s/s of stroke with pt and he understands should he develop any of these sx, he will go to the nearest ER or call 911. -pt will f/u in one year with carotid duplex -pt will call sooner should he have any issues. -continue statin.  He is not on asa due to being on Eliquis  for afib.   Lucie Apt, [facility] [phone]  Clinic MD:  Magda

## 2024-04-25 ENCOUNTER — Ambulatory Visit: Admitting: Physician Assistant

## 2024-04-25 ENCOUNTER — Ambulatory Visit (HOSPITAL_COMMUNITY)
Admission: RE | Admit: 2024-04-25 | Discharge: 2024-04-25 | Disposition: A | Discharge status: Home / Self Care | Source: Ambulatory Visit | Attending: Vascular Surgery | Admitting: Vascular Surgery

## 2024-04-25 VITALS — BP 161/82 | HR 61 | Temp 98.0°F | Wt 193.7 lb

## 2024-04-25 DIAGNOSIS — I6529 Occlusion and stenosis of unspecified carotid artery: Secondary | ICD-10-CM

## 2024-07-07 ENCOUNTER — Ambulatory Visit (HOSPITAL_COMMUNITY)
Admission: RE | Admit: 2024-07-07 | Discharge: 2024-07-07 | Disposition: A | Discharge status: Home / Self Care | Source: Ambulatory Visit | Attending: Internal Medicine | Admitting: Internal Medicine

## 2024-07-07 ENCOUNTER — Encounter (HOSPITAL_COMMUNITY): Payer: Self-pay | Admitting: Internal Medicine

## 2024-07-07 VITALS — BP 132/80 | HR 56 | Ht 67.0 in | Wt 193.4 lb

## 2024-07-07 DIAGNOSIS — D6869 Other thrombophilia: Secondary | ICD-10-CM | POA: Diagnosis not present

## 2024-07-07 DIAGNOSIS — I480 Paroxysmal atrial fibrillation: Secondary | ICD-10-CM | POA: Diagnosis not present

## 2024-07-07 NOTE — Progress Notes (Signed)
 "   Primary Care Physician: Patrisha Jerrell PARAS, PA-C (Inactive) Primary Cardiologist: None Primary Electrophysiologist: None Referring Physician: Dr. Shlomo Pac Peter Roberts is a 74 y.o. male with a history of HTN, CAD, TIA, HLD, stage IIIa CKD, carotid artery stenosis s/p left CEA 9/23, prostate cancer, and atrial fibrillation who presents for consultation in the [facility]. The patient was admitted from 4/29-10/21/22 with initial symptoms of chest pain and dizziness. He was scheduled to have sigmoidoscopy on day of presentation due to rectal bleeding noted in March. When EMS arrived, he had difficulty speaking and facial droop but both resolved upon arrival to ED. He arrived in ED in atrial fibrillation with RVR. CT showed possible acute to subacute infarct in the left corona radiata. Neurology ordered MRI which was negative for stroke. Sigmoidoscopy performed during admission to avoid being off anticoagulation. Coronary CTA with coronary calcium  score of 585. He converted to NSR on 4/30 and began Toprol  25 mg daily on 5/1; not on cardizem  given underlying CAD. He was discharged on Eliquis  5 mg BID. Held dose of Eliquis  night of 5/1 and advised to resume 5/2 if no rectal bleeding. Chest pain thought to be secondary to demand ischemia. Patient is on Eliquis  5 mg BID for a CHADS2VASC score of 5.  On evaluation today, he is currently in SR. As noted above, he felt chest pain and dizziness when he was in Afib. He felt rapid heart rate when he was in the ED. He feels well overall at this time; still not sure what caused him to have Afib. He denies any current chest pain or shortness of breath. He is compliant with Toprol  25 mg qhs. He does not drink alcohol or caffeinated beverages. He does not believe he snores or stops breathing at night.   On follow up 01/06/23, he is currently in NSR. He is undergoing radiation therapy for prostate cancer. He has not had any episodes of Afib  since last office visit. He has no bleeding issues with anticoagulation.  On follow up 07/08/23, he is currently in NSR. He has had no episodes of Afib since last office visit. He completed radiation therapy for prostate cancer 01/2023. No bleeding issues on Eliquis  5 mg BID.   Follow-up 07/07/2024.  Patient appears to be maintaining sinus rhythm.  He notes to have overall low A-fib burden.  No bleeding issues on Eliquis .  Today, he denies symptoms of palpitations, chest pain, shortness of breath, orthopnea, PND, lower extremity edema, dizziness, presyncope, syncope, snoring, daytime somnolence, bleeding, or neurologic sequela. The patient is tolerating medications without difficulties and is otherwise without complaint today.   Atrial Fibrillation Risk Factors:  he does not have symptoms or diagnosis of sleep apnea. he does not have a history of rheumatic fever. he does not have a history of alcohol use. The patient does not have a history of early familial atrial fibrillation or other arrhythmias.  he has a BMI of Body mass index is 30.29 kg/m.SABRA Filed Weights   07/07/24 1036  Weight: 87.7 kg      Family History  Problem Relation Age of Onset   Lung disease Mother    Cancer Mother 7 - 47       gyn cancer   Heart attack Father    Heart disease Father    Breast cancer Sister 6   Prostate cancer Brother 94 - 58       negative genetic testing   Prostate cancer Brother  53 - 58   Cancer Maternal Grandfather        mouth cancer    Atrial Fibrillation Management history:  Previous antiarrhythmic drugs: None Previous cardioversions: None Previous ablations: None Anticoagulation history: Eliquis  5 mg BID   Past Medical History:  Diagnosis Date   Arthritis    Carotid stenosis, asymptomatic, bilateral 2017   followed by pcp and vascular dr gretta;    s/p left CEA 02-25-2022 for stenosis >80%; (post op note in epic 03-17-2022 healing well)   last duplex in epic 02-03-2022 right  ICA 40-59% and left ICA 80-99%   Elevated blood pressure reading without diagnosis of hypertension    Family history of prostate cancer    History of anal fissures    Hyperlipidemia    Malignant neoplasm prostate (HCC) 04/2022   urologist--- dr newsome/  radiation oncologist--- dr patrcia;  dx 11/ 2023,  Gleason 4+3   Microcytic anemia    Right carpal tunnel syndrome    Past Surgical History:  Procedure Laterality Date   BIOPSY  10/21/2022   Procedure: BIOPSY;  Surgeon: Kriss Estefana DEL, DO;  Location: [facility] ENDOSCOPY;  Service: Gastroenterology;;   CARPAL TUNNEL RELEASE Left 12/2021   COLONOSCOPY  2019   ENDARTERECTOMY Left 02/25/2022   Procedure: LEFT CAROTID ENDARTERECTOMY;  Surgeon: Gretta Lonni PARAS, MD;  Location: [facility] OR;  Service: Vascular;  Laterality: Left;   FLEXIBLE SIGMOIDOSCOPY N/A 10/21/2022   Procedure: FLEXIBLE SIGMOIDOSCOPY;  Surgeon: Kriss Estefana DEL, DO;  Location: [facility] ENDOSCOPY;  Service: Gastroenterology;  Laterality: N/A;   GOLD SEED IMPLANT N/A 12/15/2022   Procedure: GOLD SEED IMPLANT;  Surgeon: Rosalind Zachary NOVAK, MD;  Location: [facility];  Service: Urology;  Laterality: N/A;  30 MINS   KNEE ARTHROSCOPY Right 2002   SPACE OAR INSTILLATION N/A 12/15/2022   Procedure: SPACE OAR INSTILLATION;  Surgeon: Rosalind Zachary NOVAK, MD;  [facility];  Service: Urology;  Laterality: N/A;    Current Outpatient Medications  Medication Sig Dispense Refill   apixaban  (ELIQUIS ) 5 MG TABS tablet TAKE 1 TABLET BY MOUTH TWICE A DAY 180 tablet 3   Ascorbic Acid (VITAMIN C) 1000 MG tablet Take 1,000 mg by mouth 2 (two) times daily.     Cholecalciferol  (VITAMIN D3) 125 MCG (5000 UT) capsule Take 5,000 Units by mouth daily.     diclofenac Sodium (VOLTAREN) 1 % GEL Apply 4 g topically 4 (four) times daily as needed (pain).     Ferrous Sulfate  (IRON) 90 (18 Fe) MG TABS Take 90 mg by mouth every evening.     Magnesium  500 MG CAPS Take 1 capsule by  mouth every evening.     metoprolol  succinate (TOPROL -XL) 25 MG 24 hr tablet TAKE 1 TABLET BY MOUTH EVERY EVENING 90 tablet 2   Oil of Oregano 1500 MG CAPS Take 1 capsule by mouth daily.     OVER THE COUNTER MEDICATION Take 3 capsules by mouth daily after lunch. Prostagenix     OVER THE COUNTER MEDICATION Blood booster 2 tabs daily     Probiotic Product (PROBIOTIC DAILY) CAPS Take 1 capsule by mouth daily.     rosuvastatin  (CRESTOR ) 20 MG tablet Take 1 tablet (20 mg total) by mouth every evening. 90 tablet 0   No current facility-administered medications for this encounter.    Allergies  Allergen Reactions   Sulfa Antibiotics Hives   ROS- All systems are reviewed and negative except as per the HPI above.  Physical Exam:  Vitals:   07/07/24 1036  BP: 132/80  Pulse: (!) 56  Weight: 87.7 kg  Height: 5' 7 (1.702 m)    GEN- The patient is well appearing, alert and oriented x 3 today.   Neck - no JVD or carotid bruit noted Lungs- Clear to ausculation bilaterally, normal work of breathing Heart- Regular rate and rhythm, no murmurs, rubs or gallops, PMI not laterally displaced Extremities- no clubbing, cyanosis, or edema Skin - no rash or ecchymosis noted   Wt Readings from Last 3 Encounters:  07/07/24 87.7 kg  04/25/24 87.9 kg  07/08/23 86.3 kg    EKG today demonstrates  EKG Interpretation Date/Time:  Friday July 07 2024 10:39:14 EST Ventricular Rate:  56 PR Interval:  136 QRS Duration:  84 QT Interval:  410 QTC Calculation: 395 R Axis:   58  Text Interpretation: Sinus bradycardia Otherwise normal ECG When compared with ECG of 08-Jul-2023 13:47, PREVIOUS ECG IS PRESENT Confirmed by Terra Pac ([id number]) on 07/07/2024 10:40:07 AM    Echo 10/20/22 demonstrated:  1. Left ventricular ejection fraction, by estimation, is 60 to 65%. The  left ventricle has normal function. The left ventricle has no regional  wall motion abnormalities. Left ventricular diastolic parameters  were  normal.   2. Right ventricular systolic function is normal. The right ventricular  size is normal.   3. The mitral valve is normal in structure. Trivial mitral valve  regurgitation. No evidence of mitral stenosis.   4. The aortic valve was not well visualized. Aortic valve regurgitation  is not visualized. No aortic stenosis is present.   5. The inferior vena cava is normal in size with greater than 50%  respiratory variability, suggesting right atrial pressure of 3 mmHg.  Epic records are reviewed at length today.  CHA2DS2-VASc Score = 5  The patient's score is based upon: CHF History: 0 HTN History: 1 Diabetes History: 0 Stroke History: 2 Vascular Disease History: 1 Age Score: 1 Gender Score: 0      ASSESSMENT AND PLAN: Paroxysmal Atrial Fibrillation (ICD10:  I48.0) The patient's CHA2DS2-VASc score is 5, indicating a 7.2% annual risk of stroke.    Patient is currently in NSR.  He appears to be maintaining sinus rhythm.  We will continue with conservative observation at this time.  Continue Toprol  25 mg nightly.  He uses a [employer] mobile device to check his rhythm at home but it is currently not working. I recommended to patient to contact the company for assistance.   Review of outside PCP notes show stable Cmet with normal creatinine 03/19/24.   2. Secondary Hypercoagulable State (ICD10:  D68.69) The patient is at significant risk for stroke/thromboembolism based upon his CHA2DS2-VASc Score of 5.  Continue Apixaban  (Eliquis ).  No bleeding issues on Eliquis .  Continue Eliquis  5 mg twice daily.  3. Obesity Body mass index is 30.29 kg/m. Recommendation to continue with exercise as tolerated.    Follow up 1 year Afib clinic.   Pac Terra, PA-C [facility] [address] [place], [place] [address] [phone] 07/07/2024 10:56 AM  "
